# Patient Record
Sex: Male | Born: 1935 | Race: White | Hispanic: No | Marital: Married | Smoking: Former smoker
Health system: Southern US, Community
[De-identification: ages and names within clinical notes are randomized; demographics above are authoritative.]

## PROBLEM LIST (undated history)

## (undated) DIAGNOSIS — C4490 Unspecified malignant neoplasm of skin, unspecified: Secondary | ICD-10-CM

## (undated) DIAGNOSIS — I10 Essential (primary) hypertension: Secondary | ICD-10-CM

## (undated) DIAGNOSIS — R600 Localized edema: Secondary | ICD-10-CM

## (undated) DIAGNOSIS — K5792 Diverticulitis of intestine, part unspecified, without perforation or abscess without bleeding: Secondary | ICD-10-CM

## (undated) DIAGNOSIS — E7800 Pure hypercholesterolemia, unspecified: Secondary | ICD-10-CM

## (undated) HISTORY — PX: TONSILLECTOMY: SUR1361

## (undated) HISTORY — PX: COLONOSCOPY: SHX174

## (undated) HISTORY — PX: HERNIA REPAIR: SHX51

## (undated) HISTORY — PX: COLON RESECTION: SHX5231

---

## 2004-07-07 ENCOUNTER — Ambulatory Visit: Payer: Self-pay | Admitting: Gastroenterology

## 2004-07-21 ENCOUNTER — Other Ambulatory Visit: Payer: Self-pay

## 2004-07-24 ENCOUNTER — Inpatient Hospital Stay: Payer: Self-pay | Admitting: Surgery

## 2006-01-29 ENCOUNTER — Emergency Department: Payer: Self-pay | Admitting: Emergency Medicine

## 2007-09-09 ENCOUNTER — Other Ambulatory Visit: Payer: Self-pay

## 2007-09-09 ENCOUNTER — Emergency Department: Payer: Self-pay | Admitting: Emergency Medicine

## 2008-11-05 ENCOUNTER — Inpatient Hospital Stay: Payer: Self-pay | Admitting: Internal Medicine

## 2008-12-11 ENCOUNTER — Ambulatory Visit: Payer: Self-pay | Admitting: Unknown Physician Specialty

## 2010-04-14 ENCOUNTER — Ambulatory Visit: Payer: Self-pay | Admitting: Unknown Physician Specialty

## 2010-07-16 ENCOUNTER — Ambulatory Visit: Payer: Self-pay | Admitting: Surgery

## 2010-11-09 ENCOUNTER — Inpatient Hospital Stay: Payer: Self-pay | Admitting: Internal Medicine

## 2011-07-20 ENCOUNTER — Emergency Department: Payer: Self-pay | Admitting: Unknown Physician Specialty

## 2014-11-08 DIAGNOSIS — M1A9XX0 Chronic gout, unspecified, without tophus (tophi): Secondary | ICD-10-CM | POA: Insufficient documentation

## 2014-11-08 DIAGNOSIS — M1A00X0 Idiopathic chronic gout, unspecified site, without tophus (tophi): Secondary | ICD-10-CM | POA: Insufficient documentation

## 2015-05-02 DIAGNOSIS — R600 Localized edema: Secondary | ICD-10-CM | POA: Insufficient documentation

## 2015-09-18 ENCOUNTER — Ambulatory Visit: Payer: Medicare Other | Admitting: Anesthesiology

## 2015-09-18 ENCOUNTER — Encounter: Payer: Self-pay | Admitting: *Deleted

## 2015-09-18 ENCOUNTER — Ambulatory Visit
Admission: RE | Admit: 2015-09-18 | Discharge: 2015-09-18 | Disposition: A | Discharge status: Home / Self Care | Payer: Medicare Other | Source: Ambulatory Visit | Attending: Ophthalmology | Admitting: Ophthalmology

## 2015-09-18 ENCOUNTER — Encounter
Admission: RE | Disposition: A | Discharge status: Home / Self Care | Payer: Self-pay | Source: Ambulatory Visit | Attending: Ophthalmology

## 2015-09-18 DIAGNOSIS — H2511 Age-related nuclear cataract, right eye: Secondary | ICD-10-CM | POA: Insufficient documentation

## 2015-09-18 DIAGNOSIS — M7989 Other specified soft tissue disorders: Secondary | ICD-10-CM | POA: Diagnosis not present

## 2015-09-18 DIAGNOSIS — Z85828 Personal history of other malignant neoplasm of skin: Secondary | ICD-10-CM | POA: Insufficient documentation

## 2015-09-18 DIAGNOSIS — K5790 Diverticulosis of intestine, part unspecified, without perforation or abscess without bleeding: Secondary | ICD-10-CM | POA: Diagnosis not present

## 2015-09-18 DIAGNOSIS — I10 Essential (primary) hypertension: Secondary | ICD-10-CM | POA: Insufficient documentation

## 2015-09-18 DIAGNOSIS — Z87891 Personal history of nicotine dependence: Secondary | ICD-10-CM | POA: Insufficient documentation

## 2015-09-18 HISTORY — DX: Diverticulitis of intestine, part unspecified, without perforation or abscess without bleeding: K57.92

## 2015-09-18 HISTORY — DX: Unspecified malignant neoplasm of skin, unspecified: C44.90

## 2015-09-18 HISTORY — DX: Essential (primary) hypertension: I10

## 2015-09-18 HISTORY — DX: Pure hypercholesterolemia, unspecified: E78.00

## 2015-09-18 HISTORY — PX: CATARACT EXTRACTION W/PHACO: SHX586

## 2015-09-18 SURGERY — PHACOEMULSIFICATION, CATARACT, WITH IOL INSERTION
Anesthesia: Monitor Anesthesia Care | Laterality: Right | Wound class: Clean

## 2015-09-18 MED ORDER — POVIDONE-IODINE 5 % OP SOLN
1.0000 "application " | Freq: Once | OPHTHALMIC | Status: AC
  Administered 2015-09-18: 1 via OPHTHALMIC

## 2015-09-18 MED ORDER — NEOMYCIN-POLYMYXIN-DEXAMETH 0.1 % OP OINT
TOPICAL_OINTMENT | OPHTHALMIC | Status: DC | PRN
  Administered 2015-09-18: 1

## 2015-09-18 MED ORDER — MIDAZOLAM HCL 5 MG/5ML IJ SOLN
INTRAMUSCULAR | Status: DC | PRN
  Administered 2015-09-18: 1 mg via INTRAVENOUS

## 2015-09-18 MED ORDER — SODIUM CHLORIDE 0.9 % IV SOLN
INTRAVENOUS | Status: DC
  Administered 2015-09-18: 08:00:00 via INTRAVENOUS

## 2015-09-18 MED ORDER — POVIDONE-IODINE 5 % OP SOLN
OPHTHALMIC | Status: AC
  Administered 2015-09-18: 1 via OPHTHALMIC
  Filled 2015-09-18: qty 30

## 2015-09-18 MED ORDER — CEFUROXIME OPHTHALMIC INJECTION 1 MG/0.1 ML
INJECTION | OPHTHALMIC | Status: DC | PRN
  Administered 2015-09-18: 1 mg via INTRACAMERAL

## 2015-09-18 MED ORDER — NA HYALUR & NA CHOND-NA HYALUR 0.4-0.35 ML IO KIT
PACK | INTRAOCULAR | Status: DC | PRN
  Administered 2015-09-18: 4 mL via INTRAOCULAR

## 2015-09-18 MED ORDER — NA HYALUR & NA CHOND-NA HYALUR 0.55-0.5 ML IO KIT
PACK | INTRAOCULAR | Status: AC
  Filled 2015-09-18: qty 1.05

## 2015-09-18 MED ORDER — NA CHONDROIT SULF-NA HYALURON 40-30 MG/ML IO SOLN
INTRAOCULAR | Status: DC | PRN
  Administered 2015-09-18: 0.5 mL via INTRAOCULAR

## 2015-09-18 MED ORDER — TETRACAINE HCL 0.5 % OP SOLN
OPHTHALMIC | Status: AC
  Administered 2015-09-18: 1 [drp] via OPHTHALMIC
  Filled 2015-09-18: qty 2

## 2015-09-18 MED ORDER — MOXIFLOXACIN HCL 0.5 % OP SOLN
OPHTHALMIC | Status: AC
  Filled 2015-09-18: qty 3

## 2015-09-18 MED ORDER — LIDOCAINE HCL (PF) 4 % IJ SOLN
INTRAMUSCULAR | Status: AC
Start: 2015-09-18 — End: 2015-09-18
  Filled 2015-09-18: qty 5

## 2015-09-18 MED ORDER — CEFUROXIME OPHTHALMIC INJECTION 1 MG/0.1 ML
INJECTION | OPHTHALMIC | Status: AC
  Filled 2015-09-18: qty 0.1

## 2015-09-18 MED ORDER — NEOMYCIN-POLYMYXIN-DEXAMETH 3.5-10000-0.1 OP OINT
TOPICAL_OINTMENT | OPHTHALMIC | Status: AC
  Filled 2015-09-18: qty 3.5

## 2015-09-18 MED ORDER — ARMC OPHTHALMIC DILATING GEL
1.0000 "application " | OPHTHALMIC | Status: DC | PRN
  Administered 2015-09-18 (×2): 1 via OPHTHALMIC

## 2015-09-18 MED ORDER — EPINEPHRINE HCL 1 MG/ML IJ SOLN
INTRAMUSCULAR | Status: AC
  Filled 2015-09-18: qty 1

## 2015-09-18 MED ORDER — TETRACAINE HCL 0.5 % OP SOLN
1.0000 [drp] | Freq: Once | OPHTHALMIC | Status: AC
  Administered 2015-09-18: 1 [drp] via OPHTHALMIC

## 2015-09-18 MED ORDER — ARMC OPHTHALMIC DILATING GEL
OPHTHALMIC | Status: AC
  Administered 2015-09-18: 1 via OPHTHALMIC
  Filled 2015-09-18: qty 0.25

## 2015-09-18 MED ORDER — MOXIFLOXACIN HCL 0.5 % OP SOLN: 1.0000 [drp] | Freq: Once | OPHTHALMIC | Status: DC

## 2015-09-18 MED ORDER — CARBACHOL 0.01 % IO SOLN
INTRAOCULAR | Status: DC | PRN
  Administered 2015-09-18: 0.5 mL via INTRAOCULAR

## 2015-09-18 MED ORDER — LIDOCAINE HCL (PF) 4 % IJ SOLN
INTRAMUSCULAR | Status: DC | PRN
  Administered 2015-09-18: 4 mL via OPHTHALMIC

## 2015-09-18 SURGICAL SUPPLY — 23 items
BSS ×3 IMPLANT
CANNULA ANT/CHMB 27GA (MISCELLANEOUS) ×3 IMPLANT
CUP MEDICINE 2OZ PLAST GRAD ST (MISCELLANEOUS) ×3 IMPLANT
GLOVE BIO SURGEON STRL SZ8 (GLOVE) ×3 IMPLANT
GLOVE BIOGEL M 6.5 STRL (GLOVE) ×3 IMPLANT
GLOVE SURG LX 7.5 STRW (GLOVE) ×2
GLOVE SURG LX STRL 7.5 STRW (GLOVE) ×1 IMPLANT
GOWN STRL REUS W/ TWL LRG LVL3 (GOWN DISPOSABLE) ×2 IMPLANT
GOWN STRL REUS W/TWL LRG LVL3 (GOWN DISPOSABLE) ×4
LENS IOL TECNIS 14.5 (Intraocular Lens) ×3 IMPLANT
LENS IOL TECNIS MONO 1P 14.5 (Intraocular Lens) ×1 IMPLANT
PACK CATARACT (MISCELLANEOUS) ×3 IMPLANT
PACK CATARACT BRASINGTON LX (MISCELLANEOUS) ×3 IMPLANT
PACK EYE AFTER SURG (MISCELLANEOUS) ×3 IMPLANT
SOL BSS BAG (MISCELLANEOUS) ×3
SOL PREP PVP 2OZ (MISCELLANEOUS) ×3
SOLUTION BSS BAG (MISCELLANEOUS) ×1 IMPLANT
SOLUTION PREP PVP 2OZ (MISCELLANEOUS) ×1 IMPLANT
SYR 3ML LL SCALE MARK (SYRINGE) ×3 IMPLANT
SYR 5ML LL (SYRINGE) ×3 IMPLANT
SYR TB 1ML 27GX1/2 LL (SYRINGE) ×3 IMPLANT
WATER STERILE IRR 1000ML POUR (IV SOLUTION) ×3 IMPLANT
WIPE NON LINTING 3.25X3.25 (MISCELLANEOUS) ×3 IMPLANT

## 2015-09-18 NOTE — H&P (Signed)
  The History and Physical notes are on paper, have been signed, and are to be scanned. The patient remains stable and unchanged from the H&P.   Previous H&P reviewed, patient examined, and there are no changes.  Keith Bryant 09/18/2015 8:05 AM

## 2015-09-18 NOTE — Anesthesia Preprocedure Evaluation (Signed)
Anesthesia Evaluation  Patient identified by MRN, date of birth, ID band Patient awake    Reviewed: Allergy & Precautions, H&P , NPO status , Patient's Chart, lab work & pertinent test results, reviewed documented beta blocker date and time   History of Anesthesia Complications Negative for: history of anesthetic complications  Airway Mallampati: III  TM Distance: >3 FB Neck ROM: full    Dental no notable dental hx. (+) Caps, Poor Dentition   Pulmonary neg shortness of breath, neg sleep apnea, neg COPD, neg recent URI, former smoker,    Pulmonary exam normal breath sounds clear to auscultation       Cardiovascular Exercise Tolerance: Good hypertension, (-) angina(-) CAD, (-) Past MI, (-) Cardiac Stents and (-) CABG Normal cardiovascular exam(-) dysrhythmias + Valvular Problems/Murmurs  Rhythm:regular Rate:Normal     Neuro/Psych negative neurological ROS  negative psych ROS   GI/Hepatic Neg liver ROS, GERD  Controlled,  Endo/Other  negative endocrine ROS  Renal/GU negative Renal ROS  negative genitourinary   Musculoskeletal   Abdominal   Peds  Hematology negative hematology ROS (+)   Anesthesia Other Findings Past Medical History:   Hypertension                                                 Diverticulitis                                               Hypercholesteremia                                           Skin cancer                                                  Reproductive/Obstetrics negative OB ROS                             Anesthesia Physical Anesthesia Plan  ASA: II  Anesthesia Plan: MAC   Post-op Pain Management:    Induction:   Airway Management Planned:   Additional Equipment:   Intra-op Plan:   Post-operative Plan:   Informed Consent: I have reviewed the patients History and Physical, chart, labs and discussed the procedure including the risks, benefits  and alternatives for the proposed anesthesia with the patient or authorized representative who has indicated his/her understanding and acceptance.   Dental Advisory Given  Plan Discussed with: Anesthesiologist, CRNA and Surgeon  Anesthesia Plan Comments:         Anesthesia Quick Evaluation

## 2015-09-18 NOTE — Op Note (Signed)
OPERATIVE NOTE  Keith Bryant [id number] 09/18/2015   PREOPERATIVE DIAGNOSIS:  Nuclear Sclerotic Cataract Right Eye H25.11   POSTOPERATIVE DIAGNOSIS: Nuclear Sclerotic Cataract Right Eye H25.11          PROCEDURE:  Phacoemusification with posterior chamber intraocular lens placement of the right eye   LENS:   Implant Name Type Inv. Item Serial No. Manufacturer Lot No. LRB No. Used  LENS IOL TECNIS 14.5 - [id number] Intraocular Lens LENS IOL TECNIS 14.5 [id number] AMO   Right 1       ULTRASOUND TIME: 12.5 %  of 0 minutes 57 seconds, CDE 7.2  SURGEON:  Wyonia Hough, MD   ANESTHESIA:  Topical with tetracaine drops and 2% Xylocaine jelly, augmented with 1% preservative-free intracameral lidocaine.    COMPLICATIONS:  None.   DESCRIPTION OF PROCEDURE:  The patient was identified in the holding room and transported to the operating room and placed in the supine position under the operating microscope. The right eye was identified as the operative eye and it was prepped and draped in the usual sterile ophthalmic fashion.   A 1 millimeter clear-corneal paracentesis was made at the 12:00 position.  0.5 ml of preservative-free 1% lidocaine was injected into the anterior chamber. The anterior chamber was filled with Viscoat viscoelastic.  A 2.4 millimeter keratome was used to make a near-clear corneal incision at the 9:00 position. A curvilinear capsulorrhexis was made with a cystotome and capsulorrhexis forceps.  Balanced salt solution was used to hydrodissect and hydrodelineate the nucleus.   Phacoemulsification was then used in stop and chop fashion to remove the lens nucleus and epinucleus.  The remaining cortex was then removed using the irrigation and aspiration handpiece. Provisc was then placed into the capsular bag to distend it for lens placement.  A lens was then injected into the capsular bag.  The remaining viscoelastic was aspirated.  Wounds were hydrated with  balanced salt solution.  The anterior chamber was inflated to a physiologic pressure with balanced salt solution. Cefuroxime 0.1 ml of a 10mg /ml solution was injected into the anterior chamber for a dose of 1 mg of intracameral antibiotic at the completion of the case. Miostat was placed into the anterior chamber to constrict the pupil.  No wound leaks were noted.  Topical Vigamox drops and Maxitrol ointment were applied to the eye.  The patient was taken to the recovery room in stable condition without complications of anesthesia or surgery.  Keith Bryant 09/18/2015, 9:02 AM

## 2015-09-18 NOTE — Discharge Instructions (Signed)
Eye Surgery Discharge Instructions  Expect mild scratchy sensation or mild soreness. DO NOT RUB YOUR EYE!  The day of surgery:  Minimal physical activity, but bed rest is not required  No reading, computer work, or close hand work  No bending, lifting, or straining.  May watch TV  For 24 hours:  No driving, legal decisions, or alcoholic beverages  Safety precautions  Eat anything you prefer: It is better to start with liquids, then soup then solid foods.  _____ Eye patch should be worn until postoperative exam tomorrow.  ____ Solar shield eyeglasses should be worn for comfort in the sunlight/patch while sleeping  Resume all regular medications including aspirin or Coumadin if these were discontinued prior to surgery. You may shower, bathe, shave, or wash your hair. Tylenol may be taken for mild discomfort.  Call your doctor if you experience significant pain, nausea, or vomiting, fever > 101 or other signs of infection. [phone] or [phone] Specific instructions:  Follow-up Information    Follow up with Leandrew Koyanagi, MD On 09/19/2015.   Specialty:  Ophthalmology   Why:  8:20   Contact information:   [address]   [place] [address] [phone]

## 2015-09-18 NOTE — Transfer of Care (Signed)
Immediate Anesthesia Transfer of Care Note  Patient: Keith Bryant.  Procedure(s) Performed: Procedure(s) with comments: CATARACT EXTRACTION PHACO AND INTRAOCULAR LENS PLACEMENT (IOC) (Right) - US00:57.4 AP12.5 CDE7.15 FLUID LOT [id number]  Patient Location: PACU  Anesthesia Type:MAC  Level of Consciousness: awake, alert , oriented and patient cooperative  Airway & Oxygen Therapy: Patient Spontanous Breathing  Post-op Assessment: Report given to RN, Post -op Vital signs reviewed and stable and Patient moving all extremities X 4  Post vital signs: Reviewed and stable  Last Vitals:  Filed Vitals:   09/18/15 0711  BP: 164/70  Pulse: 71  Temp: 35.9 C  Resp: 18    Complications: No apparent anesthesia complications

## 2015-09-18 NOTE — Anesthesia Postprocedure Evaluation (Signed)
Anesthesia Post Note  Patient: Keith A Merrow Sr.  Procedure(s) Performed: Procedure(s) (LRB): CATARACT EXTRACTION PHACO AND INTRAOCULAR LENS PLACEMENT ([facility]) (Right)  Patient location during evaluation: PACU Anesthesia Type: MAC Level of consciousness: awake and alert, patient cooperative and oriented Pain management: satisfactory to patient Vital Signs Assessment: post-procedure vital signs reviewed and stable Respiratory status: nonlabored ventilation, respiratory function stable and spontaneous breathing Cardiovascular status: stable Postop Assessment: no headache, no backache and no signs of nausea or vomiting Anesthetic complications: no    Last Vitals:  Filed Vitals:   09/18/15 0711  BP: 164/70  Pulse: 71  Temp: 35.9 C  Resp: 18    Last Pain: There were no vitals filed for this visit.               Silvana Newness A

## 2015-09-19 ENCOUNTER — Encounter: Payer: Self-pay | Admitting: Ophthalmology

## 2015-09-22 ENCOUNTER — Encounter: Payer: Self-pay | Admitting: Ophthalmology

## 2015-10-14 ENCOUNTER — Encounter: Payer: Self-pay | Admitting: *Deleted

## 2015-10-16 ENCOUNTER — Encounter: Payer: Self-pay | Admitting: *Deleted

## 2015-10-16 ENCOUNTER — Encounter
Admission: RE | Disposition: A | Discharge status: Home / Self Care | Payer: Self-pay | Source: Ambulatory Visit | Attending: Ophthalmology

## 2015-10-16 ENCOUNTER — Ambulatory Visit
Admission: RE | Admit: 2015-10-16 | Discharge: 2015-10-16 | Disposition: A | Discharge status: Home / Self Care | Payer: Medicare Other | Source: Ambulatory Visit | Attending: Ophthalmology | Admitting: Ophthalmology

## 2015-10-16 ENCOUNTER — Ambulatory Visit: Payer: Medicare Other | Admitting: Anesthesiology

## 2015-10-16 DIAGNOSIS — Z87891 Personal history of nicotine dependence: Secondary | ICD-10-CM | POA: Insufficient documentation

## 2015-10-16 DIAGNOSIS — M7989 Other specified soft tissue disorders: Secondary | ICD-10-CM | POA: Diagnosis not present

## 2015-10-16 DIAGNOSIS — Z9841 Cataract extraction status, right eye: Secondary | ICD-10-CM | POA: Insufficient documentation

## 2015-10-16 DIAGNOSIS — E7800 Pure hypercholesterolemia, unspecified: Secondary | ICD-10-CM | POA: Diagnosis not present

## 2015-10-16 DIAGNOSIS — Z85828 Personal history of other malignant neoplasm of skin: Secondary | ICD-10-CM | POA: Diagnosis not present

## 2015-10-16 DIAGNOSIS — I10 Essential (primary) hypertension: Secondary | ICD-10-CM | POA: Diagnosis not present

## 2015-10-16 DIAGNOSIS — K5790 Diverticulosis of intestine, part unspecified, without perforation or abscess without bleeding: Secondary | ICD-10-CM | POA: Insufficient documentation

## 2015-10-16 DIAGNOSIS — H2512 Age-related nuclear cataract, left eye: Secondary | ICD-10-CM | POA: Insufficient documentation

## 2015-10-16 HISTORY — PX: CATARACT EXTRACTION W/PHACO: SHX586

## 2015-10-16 HISTORY — DX: Localized edema: R60.0

## 2015-10-16 SURGERY — PHACOEMULSIFICATION, CATARACT, WITH IOL INSERTION
Anesthesia: Monitor Anesthesia Care | Laterality: Left | Wound class: Clean

## 2015-10-16 MED ORDER — LIDOCAINE HCL (PF) 4 % IJ SOLN
INTRAMUSCULAR | Status: DC | PRN
  Administered 2015-10-16: .5 mL via OPHTHALMIC

## 2015-10-16 MED ORDER — ARMC OPHTHALMIC DILATING GEL
OPHTHALMIC | Status: AC
  Administered 2015-10-16: 1
  Filled 2015-10-16: qty 0.25

## 2015-10-16 MED ORDER — CEFUROXIME OPHTHALMIC INJECTION 1 MG/0.1 ML
INJECTION | OPHTHALMIC | Status: DC | PRN
  Administered 2015-10-16: .1 mL via INTRACAMERAL

## 2015-10-16 MED ORDER — NEOMYCIN-POLYMYXIN-DEXAMETH 3.5-10000-0.1 OP OINT
TOPICAL_OINTMENT | OPHTHALMIC | Status: AC
  Filled 2015-10-16: qty 3.5

## 2015-10-16 MED ORDER — EPINEPHRINE HCL 1 MG/ML IJ SOLN
INTRAOCULAR | Status: DC | PRN
  Administered 2015-10-16: 250 mL via OPHTHALMIC

## 2015-10-16 MED ORDER — SODIUM CHLORIDE 0.9 % IV SOLN
INTRAVENOUS | Status: DC
  Administered 2015-10-16: 07:00:00 via INTRAVENOUS

## 2015-10-16 MED ORDER — CEFUROXIME OPHTHALMIC INJECTION 1 MG/0.1 ML
INJECTION | OPHTHALMIC | Status: AC
  Filled 2015-10-16: qty 0.1

## 2015-10-16 MED ORDER — MOXIFLOXACIN HCL 0.5 % OP SOLN
OPHTHALMIC | Status: AC
  Filled 2015-10-16: qty 3

## 2015-10-16 MED ORDER — MIDAZOLAM HCL 2 MG/2ML IJ SOLN
INTRAMUSCULAR | Status: DC | PRN
  Administered 2015-10-16: 1 mg via INTRAVENOUS

## 2015-10-16 MED ORDER — TETRACAINE HCL 0.5 % OP SOLN: 1.0000 [drp] | Freq: Once | OPHTHALMIC | Status: DC

## 2015-10-16 MED ORDER — ARMC OPHTHALMIC DILATING GEL: 1.0000 "application " | OPHTHALMIC | Status: AC

## 2015-10-16 MED ORDER — TETRACAINE HCL 0.5 % OP SOLN
OPHTHALMIC | Status: AC
  Administered 2015-10-16: 1 [drp]
  Filled 2015-10-16: qty 2

## 2015-10-16 MED ORDER — NA HYALUR & NA CHOND-NA HYALUR 0.4-0.35 ML IO KIT
PACK | INTRAOCULAR | Status: DC | PRN
  Administered 2015-10-16: .75 mL via INTRAOCULAR

## 2015-10-16 MED ORDER — EPINEPHRINE HCL 1 MG/ML IJ SOLN
INTRAMUSCULAR | Status: AC
  Filled 2015-10-16: qty 1

## 2015-10-16 MED ORDER — NA HYALUR & NA CHOND-NA HYALUR 0.55-0.5 ML IO KIT
PACK | INTRAOCULAR | Status: AC
  Filled 2015-10-16: qty 1.05

## 2015-10-16 MED ORDER — MOXIFLOXACIN HCL 0.5 % OP SOLN: 1.0000 [drp] | OPHTHALMIC | Status: DC | PRN

## 2015-10-16 MED ORDER — POVIDONE-IODINE 5 % OP SOLN
OPHTHALMIC | Status: AC
  Administered 2015-10-16: 1
  Filled 2015-10-16: qty 30

## 2015-10-16 MED ORDER — POVIDONE-IODINE 5 % OP SOLN: 1.0000 "application " | Freq: Once | OPHTHALMIC | Status: DC

## 2015-10-16 MED ORDER — NEOMYCIN-POLYMYXIN-DEXAMETH 0.1 % OP OINT
TOPICAL_OINTMENT | OPHTHALMIC | Status: DC | PRN
  Administered 2015-10-16: 1 via OPHTHALMIC

## 2015-10-16 MED ORDER — LIDOCAINE HCL (PF) 4 % IJ SOLN
INTRAMUSCULAR | Status: AC
  Filled 2015-10-16: qty 5

## 2015-10-16 MED ORDER — CARBACHOL 0.01 % IO SOLN
INTRAOCULAR | Status: DC | PRN
  Administered 2015-10-16: .5 mL via INTRAOCULAR

## 2015-10-16 SURGICAL SUPPLY — 23 items
BSS 15ML ×3 IMPLANT
CANNULA ANT/CHMB 27GA (MISCELLANEOUS) ×3 IMPLANT
CUP MEDICINE 2OZ PLAST GRAD ST (MISCELLANEOUS) ×3 IMPLANT
GLOVE BIO SURGEON STRL SZ8 (GLOVE) ×3 IMPLANT
GLOVE BIOGEL M 6.5 STRL (GLOVE) ×3 IMPLANT
GLOVE SURG LX 7.5 STRW (GLOVE) ×2
GLOVE SURG LX STRL 7.5 STRW (GLOVE) ×1 IMPLANT
GOWN STRL REUS W/ TWL LRG LVL3 (GOWN DISPOSABLE) ×2 IMPLANT
GOWN STRL REUS W/TWL LRG LVL3 (GOWN DISPOSABLE) ×4
LENS IOL TECNIS 15.5 (Intraocular Lens) ×3 IMPLANT
LENS IOL TECNIS MONO 1P 15.5 (Intraocular Lens) ×1 IMPLANT
PACK CATARACT (MISCELLANEOUS) ×3 IMPLANT
PACK CATARACT BRASINGTON LX (MISCELLANEOUS) ×3 IMPLANT
PACK EYE AFTER SURG (MISCELLANEOUS) ×3 IMPLANT
SOL BSS BAG (MISCELLANEOUS) ×3
SOL PREP PVP 2OZ (MISCELLANEOUS) ×3
SOLUTION BSS BAG (MISCELLANEOUS) ×1 IMPLANT
SOLUTION PREP PVP 2OZ (MISCELLANEOUS) ×1 IMPLANT
SYR 3ML LL SCALE MARK (SYRINGE) ×3 IMPLANT
SYR 5ML LL (SYRINGE) ×3 IMPLANT
SYR TB 1ML 27GX1/2 LL (SYRINGE) ×3 IMPLANT
WATER STERILE IRR 1000ML POUR (IV SOLUTION) ×3 IMPLANT
WIPE NON LINTING 3.25X3.25 (MISCELLANEOUS) ×3 IMPLANT

## 2015-10-16 NOTE — Op Note (Signed)
OPERATIVE NOTE  Jomes A Lindly Sr. [id number] 10/16/2015   PREOPERATIVE DIAGNOSIS:  Nuclear sclerotic cataract left eye. H25.12   POSTOPERATIVE DIAGNOSIS:    Nuclear sclerotic cataract left eye.     PROCEDURE:  Phacoemusification with posterior chamber intraocular lens placement of the left eye   LENS:   Implant Name Type Inv. Item Serial No. Manufacturer Lot No. LRB No. Used  LENS IMPL INTRAOC ZCB00 15.5 - [id number] Intraocular Lens LENS IMPL INTRAOC ZCB00 15.5 [id number]   Left 1        ULTRASOUND TIME: 12.5 % of 1 minutes, 3 seconds.  CDE 8.8   SURGEON:  Wyonia Hough, MD   ANESTHESIA:  Topical with tetracaine drops and 2% Xylocaine jelly, augmented with 1% preservative-free intracameral lidocaine.    COMPLICATIONS:  None.   DESCRIPTION OF PROCEDURE:  The patient was identified in the holding room and transported to the operating room and placed in the supine position under the operating microscope.  The left eye was identified as the operative eye and it was prepped and draped in the usual sterile ophthalmic fashion.   A 1 millimeter clear-corneal paracentesis was made at the 1:30 position.  0.5 ml of preservative-free 1% lidocaine was injected into the anterior chamber. The anterior chamber was filled with Viscoat viscoelastic.  A 2.4 millimeter keratome was used to make a near-clear corneal incision at the 10:30 position.  .  A curvilinear capsulorrhexis was made with a cystotome and capsulorrhexis forceps.  Balanced salt solution was used to hydrodissect and hydrodelineate the nucleus.   Phacoemulsification was then used in stop and chop fashion to remove the lens nucleus and epinucleus.  The remaining cortex was then removed using the irrigation and aspiration handpiece. Provisc was then placed into the capsular bag to distend it for lens placement.  A lens was then injected into the capsular bag.  The remaining viscoelastic was aspirated.   Wounds were hydrated  with balanced salt solution.  The anterior chamber was inflated to a physiologic pressure with balanced salt solution. Cefuroxime 0.1 ml of a 10mg /ml solution was injected into the anterior chamber for a dose of 1 mg of intracameral antibiotic at the completion of the case.  Miostat was placed into the anterior chamber to constrict the pupil.  No wound leaks were noted.  Topical Vigamox drops and Maxitrol ointment were applied to the eye.  The patient was taken to the recovery room in stable condition without complications of anesthesia or surgery  Shann Lewellyn 10/16/2015, 7:55 AM

## 2015-10-16 NOTE — H&P (Signed)
  The History and Physical notes are on paper, have been signed, and are to be scanned. The patient remains stable and unchanged from the H&P.   Previous H&P reviewed, patient examined, and there are no changes.  Keith Bryant 10/16/2015 7:25 AM

## 2015-10-16 NOTE — Anesthesia Postprocedure Evaluation (Signed)
Anesthesia Post Note  Patient: Keith A Degenhart Sr.  Procedure(s) Performed: Procedure(s) (LRB): CATARACT EXTRACTION PHACO AND INTRAOCULAR LENS PLACEMENT (IOC) (Left)  Patient location during evaluation: Other Anesthesia Type: General Level of consciousness: awake and alert Pain management: pain level controlled Vital Signs Assessment: post-procedure vital signs reviewed and stable Respiratory status: spontaneous breathing, nonlabored ventilation, respiratory function stable and patient connected to nasal cannula oxygen Cardiovascular status: blood pressure returned to baseline and stable Postop Assessment: no signs of nausea or vomiting Anesthetic complications: no    Last Vitals:  Filed Vitals:   10/16/15 0758 10/16/15 0826  BP: 160/81 155/67  Pulse: 68 72  Temp: 36.8 C 36.8 C  Resp: 16 18    Last Pain: There were no vitals filed for this visit.               [name]

## 2015-10-16 NOTE — Anesthesia Procedure Notes (Signed)
Procedure Name: MAC Date/Time: 10/16/2015 7:36 AM Performed by: Johnna Acosta Pre-anesthesia Checklist: Patient identified, Emergency Drugs available, Suction available, Patient being monitored and Timeout performed Patient Re-evaluated:Patient Re-evaluated prior to inductionOxygen Delivery Method: Nasal cannula

## 2015-10-16 NOTE — Discharge Instructions (Signed)
Eye Surgery Discharge Instructions  Expect mild scratchy sensation or mild soreness. DO NOT RUB YOUR EYE!  The day of surgery:  Minimal physical activity, but bed rest is not required  No reading, computer work, or close hand work  No bending, lifting, or straining.  May watch TV  For 24 hours:  No driving, legal decisions, or alcoholic beverages  Safety precautions  Eat anything you prefer: It is better to start with liquids, then soup then solid foods.  _____ Eye patch should be worn until postoperative exam tomorrow.  ____ Solar shield eyeglasses should be worn for comfort in the sunlight/patch while sleeping  Resume all regular medications including aspirin or Coumadin if these were discontinued prior to surgery. You may shower, bathe, shave, or wash your hair. Tylenol may be taken for mild discomfort.  Call your doctor if you experience significant pain, nausea, or vomiting, fever > 101 or other signs of infection. [phone] or [phone] Specific instructions:  Follow-up Information    Follow up with Leandrew Koyanagi, MD. Go on 10/17/2015.   Specialty:  Ophthalmology   Why:  Appointment time is set for 10:10 TOMORROW, For wound re-check   Contact information:   [place] [address] [phone]     AMBULATORY SURGERY  DISCHARGE INSTRUCTIONS   1) The drugs that you were given will stay in your system until tomorrow so for the next 24 hours you should not:  A) Drive an automobile B) Make any legal decisions C) Drink any alcoholic beverage   2) You may resume regular meals tomorrow.  Today it is better to start with liquids and gradually work up to solid foods.  You may eat anything you prefer, but it is better to start with liquids, then soup and crackers, and gradually work up to solid foods.   3) Please notify your doctor immediately if you have any unusual bleeding, trouble breathing, redness and pain at the surgery site,  drainage, fever, or pain not relieved by medication.    4) Additional Instructions:        Please contact your physician with any problems or Same Day Surgery at [phone], Monday through Friday 6 am to 4 pm, or [place] [facility] number at [phone].

## 2015-10-16 NOTE — Anesthesia Preprocedure Evaluation (Signed)
Anesthesia Evaluation  Patient identified by MRN, date of birth, ID band Patient awake    Reviewed: Allergy & Precautions, NPO status , Patient's Chart, lab work & pertinent test results, reviewed documented beta blocker date and time   Airway Mallampati: II  TM Distance: >3 FB     Dental  (+) Chipped   Pulmonary former smoker,           Cardiovascular hypertension, Pt. on medications      Neuro/Psych    GI/Hepatic   Endo/Other    Renal/GU      Musculoskeletal   Abdominal   Peds  Hematology   Anesthesia Other Findings   Reproductive/Obstetrics                             Anesthesia Physical Anesthesia Plan  ASA: III  Anesthesia Plan: MAC   Post-op Pain Management:    Induction:   Airway Management Planned:   Additional Equipment:   Intra-op Plan:   Post-operative Plan:   Informed Consent: I have reviewed the patients History and Physical, chart, labs and discussed the procedure including the risks, benefits and alternatives for the proposed anesthesia with the patient or authorized representative who has indicated his/her understanding and acceptance.     Plan Discussed with: CRNA  Anesthesia Plan Comments:         Anesthesia Quick Evaluation

## 2015-10-16 NOTE — Transfer of Care (Signed)
Immediate Anesthesia Transfer of Care Note  Patient: Keith Bryant.  Procedure(s) Performed: Procedure(s) with comments: CATARACT EXTRACTION PHACO AND INTRAOCULAR LENS PLACEMENT ([place]) (Left) - [place]   1:02.9 AP%  12.5 CDE    8.81 casette lot # [id number]   exp 02/10/2017  Patient Location: PACU  Anesthesia Type:MAC  Level of Consciousness: awake, alert  and oriented  Airway & Oxygen Therapy: Patient Spontanous Breathing  Post-op Assessment: Report given to RN  Post vital signs: Reviewed and stable  Last Vitals:  Filed Vitals:   10/14/15 1703 10/16/15 0616  BP: 147/72 155/74  Pulse: 78 78  Temp:  36.5 C  Resp:  16    Complications: No apparent anesthesia complications

## 2015-10-17 ENCOUNTER — Encounter: Payer: Self-pay | Admitting: Ophthalmology

## 2016-03-06 ENCOUNTER — Other Ambulatory Visit: Payer: Self-pay

## 2016-03-06 ENCOUNTER — Inpatient Hospital Stay
Admission: EM | Admit: 2016-03-06 | Discharge: 2016-03-11 | DRG: 389 | Disposition: A | Discharge status: Home / Self Care | Payer: Medicare Other | Attending: Surgery | Admitting: Surgery

## 2016-03-06 ENCOUNTER — Emergency Department: Payer: Medicare Other

## 2016-03-06 ENCOUNTER — Encounter: Payer: Self-pay | Admitting: Surgery

## 2016-03-06 ENCOUNTER — Inpatient Hospital Stay: Payer: Medicare Other

## 2016-03-06 DIAGNOSIS — Z800 Family history of malignant neoplasm of digestive organs: Secondary | ICD-10-CM | POA: Diagnosis not present

## 2016-03-06 DIAGNOSIS — E119 Type 2 diabetes mellitus without complications: Secondary | ICD-10-CM | POA: Diagnosis present

## 2016-03-06 DIAGNOSIS — N179 Acute kidney failure, unspecified: Secondary | ICD-10-CM | POA: Diagnosis not present

## 2016-03-06 DIAGNOSIS — E7800 Pure hypercholesterolemia, unspecified: Secondary | ICD-10-CM | POA: Diagnosis present

## 2016-03-06 DIAGNOSIS — Z87891 Personal history of nicotine dependence: Secondary | ICD-10-CM | POA: Diagnosis not present

## 2016-03-06 DIAGNOSIS — N400 Enlarged prostate without lower urinary tract symptoms: Secondary | ICD-10-CM | POA: Diagnosis present

## 2016-03-06 DIAGNOSIS — Z79899 Other long term (current) drug therapy: Secondary | ICD-10-CM

## 2016-03-06 DIAGNOSIS — Z85828 Personal history of other malignant neoplasm of skin: Secondary | ICD-10-CM

## 2016-03-06 DIAGNOSIS — Z803 Family history of malignant neoplasm of breast: Secondary | ICD-10-CM

## 2016-03-06 DIAGNOSIS — I10 Essential (primary) hypertension: Secondary | ICD-10-CM | POA: Diagnosis not present

## 2016-03-06 DIAGNOSIS — E785 Hyperlipidemia, unspecified: Secondary | ICD-10-CM | POA: Diagnosis not present

## 2016-03-06 DIAGNOSIS — E876 Hypokalemia: Secondary | ICD-10-CM | POA: Diagnosis not present

## 2016-03-06 DIAGNOSIS — E860 Dehydration: Secondary | ICD-10-CM | POA: Diagnosis present

## 2016-03-06 DIAGNOSIS — K565 Intestinal adhesions [bands] with obstruction (postprocedural) (postinfection): Secondary | ICD-10-CM | POA: Diagnosis present

## 2016-03-06 DIAGNOSIS — K5669 Other intestinal obstruction: Secondary | ICD-10-CM | POA: Diagnosis present

## 2016-03-06 DIAGNOSIS — R1110 Vomiting, unspecified: Secondary | ICD-10-CM | POA: Insufficient documentation

## 2016-03-06 DIAGNOSIS — R739 Hyperglycemia, unspecified: Secondary | ICD-10-CM | POA: Diagnosis not present

## 2016-03-06 DIAGNOSIS — K56609 Unspecified intestinal obstruction, unspecified as to partial versus complete obstruction: Secondary | ICD-10-CM

## 2016-03-06 DIAGNOSIS — Z7982 Long term (current) use of aspirin: Secondary | ICD-10-CM

## 2016-03-06 DIAGNOSIS — D696 Thrombocytopenia, unspecified: Secondary | ICD-10-CM | POA: Diagnosis present

## 2016-03-06 DIAGNOSIS — Z8249 Family history of ischemic heart disease and other diseases of the circulatory system: Secondary | ICD-10-CM

## 2016-03-06 LAB — COMPREHENSIVE METABOLIC PANEL
ALT: 18 U/L (ref 17–63)
AST: 27 U/L (ref 15–41)
Albumin: 4.4 g/dL (ref 3.5–5.0)
Alkaline Phosphatase: 35 U/L — ABNORMAL LOW (ref 38–126)
Anion gap: 16 — ABNORMAL HIGH (ref 5–15)
BUN: 35 mg/dL — ABNORMAL HIGH (ref 6–20)
CO2: 26 mmol/L (ref 22–32)
Calcium: 9.7 mg/dL (ref 8.9–10.3)
Chloride: 97 mmol/L — ABNORMAL LOW (ref 101–111)
Creatinine, Ser: 1.7 mg/dL — ABNORMAL HIGH (ref 0.61–1.24)
GFR calc Af Amer: 41 mL/min — ABNORMAL LOW (ref >60)
GFR calc non Af Amer: 35 mL/min — ABNORMAL LOW (ref >60)
Glucose, Bld: 205 mg/dL — ABNORMAL HIGH (ref 65–99)
Potassium: 3.3 mmol/L — ABNORMAL LOW (ref 3.5–5.1)
Sodium: 139 mmol/L (ref 135–145)
Total Bilirubin: 1.8 mg/dL — ABNORMAL HIGH (ref 0.3–1.2)
Total Protein: 8.1 g/dL (ref 6.5–8.1)

## 2016-03-06 LAB — CBC
HEMATOCRIT: 51.3 % (ref 40.0–52.0)
Hemoglobin: 17.7 g/dL (ref 13.0–18.0)
MCH: 31.1 pg (ref 26.0–34.0)
MCHC: 34.4 g/dL (ref 32.0–36.0)
MCV: 90.2 fL (ref 80.0–100.0)
Platelets: 117 10*3/uL — ABNORMAL LOW (ref 150–440)
RBC: 5.69 MIL/uL (ref 4.40–5.90)
RDW: 14.5 % (ref 11.5–14.5)
WBC: 6.4 10*3/uL (ref 3.8–10.6)

## 2016-03-06 LAB — URINALYSIS COMPLETE WITH MICROSCOPIC (ARMC ONLY)
Bacteria, UA: NONE SEEN
Bilirubin Urine: NEGATIVE
Glucose, UA: NEGATIVE mg/dL
Ketones, ur: NEGATIVE mg/dL
Leukocytes, UA: NEGATIVE
Nitrite: NEGATIVE
Protein, ur: 30 mg/dL — AB
Specific Gravity, Urine: 1.057 — ABNORMAL HIGH (ref 1.005–1.030)
pH: 5 (ref 5.0–8.0)

## 2016-03-06 LAB — GLUCOSE, CAPILLARY: Glucose-Capillary: 103 mg/dL — ABNORMAL HIGH (ref 65–99)

## 2016-03-06 LAB — LIPASE, BLOOD: Lipase: 19 U/L (ref 11–51)

## 2016-03-06 MED ORDER — ONDANSETRON HCL 4 MG/2ML IJ SOLN
4.0000 mg | Freq: Once | INTRAMUSCULAR | Status: AC
  Administered 2016-03-06: 4 mg via INTRAVENOUS
  Filled 2016-03-06: qty 2

## 2016-03-06 MED ORDER — BENZOCAINE (TOPICAL) 20 % EX AERO: INHALATION_SPRAY | Freq: Four times a day (QID) | CUTANEOUS | Status: DC | PRN

## 2016-03-06 MED ORDER — FAMOTIDINE IN NACL 20-0.9 MG/50ML-% IV SOLN
20.0000 mg | Freq: Two times a day (BID) | INTRAVENOUS | Status: DC
  Administered 2016-03-06 – 2016-03-10 (×10): 20 mg via INTRAVENOUS
  Filled 2016-03-06 (×12): qty 50

## 2016-03-06 MED ORDER — HYDROCHLOROTHIAZIDE 25 MG PO TABS: 12.5000 mg | ORAL_TABLET | Freq: Every day | ORAL | Status: DC

## 2016-03-06 MED ORDER — ONDANSETRON HCL 4 MG/2ML IJ SOLN
4.0000 mg | Freq: Once | INTRAMUSCULAR | Status: AC
Start: 2016-03-06 — End: 2016-03-06
  Administered 2016-03-06: 4 mg via INTRAVENOUS

## 2016-03-06 MED ORDER — BENZOCAINE 20 % MT SOLN
OROMUCOSAL | Status: AC
  Filled 2016-03-06: qty 5

## 2016-03-06 MED ORDER — ONDANSETRON 4 MG PO TBDP
4.0000 mg | ORAL_TABLET | Freq: Once | ORAL | Status: DC | PRN
Start: 2016-03-06 — End: 2016-03-06

## 2016-03-06 MED ORDER — ACETAMINOPHEN 500 MG PO TABS: 1000.0000 mg | ORAL_TABLET | Freq: Four times a day (QID) | ORAL | Status: DC | PRN

## 2016-03-06 MED ORDER — ONDANSETRON HCL 4 MG/2ML IJ SOLN
INTRAMUSCULAR | Status: AC
  Filled 2016-03-06: qty 2

## 2016-03-06 MED ORDER — ONDANSETRON 8 MG PO TBDP: 4.0000 mg | ORAL_TABLET | Freq: Four times a day (QID) | ORAL | Status: DC | PRN

## 2016-03-06 MED ORDER — HYDRALAZINE HCL 20 MG/ML IJ SOLN: 10.0000 mg | Freq: Four times a day (QID) | INTRAMUSCULAR | Status: DC | PRN

## 2016-03-06 MED ORDER — POTASSIUM CHLORIDE IN NACL 20-0.9 MEQ/L-% IV SOLN: INTRAVENOUS | Status: DC

## 2016-03-06 MED ORDER — KETOROLAC TROMETHAMINE 15 MG/ML IJ SOLN: 15.0000 mg | Freq: Four times a day (QID) | INTRAMUSCULAR | Status: AC | PRN

## 2016-03-06 MED ORDER — HYDRALAZINE HCL 20 MG/ML IJ SOLN: 10.0000 mg | INTRAMUSCULAR | Status: DC | PRN

## 2016-03-06 MED ORDER — IOPAMIDOL (ISOVUE-300) INJECTION 61%
80.0000 mL | Freq: Once | INTRAVENOUS | Status: AC | PRN
  Administered 2016-03-06: 80 mL via INTRAVENOUS

## 2016-03-06 MED ORDER — ACETAMINOPHEN 500 MG PO TABS
1000.0000 mg | ORAL_TABLET | Freq: Four times a day (QID) | ORAL | Status: DC
  Filled 2016-03-06: qty 2

## 2016-03-06 MED ORDER — SODIUM CHLORIDE 0.9 % IV BOLUS (SEPSIS)
1000.0000 mL | INTRAVENOUS | Status: AC
  Administered 2016-03-06: 1000 mL via INTRAVENOUS

## 2016-03-06 MED ORDER — ONDANSETRON HCL 4 MG/2ML IJ SOLN
4.0000 mg | Freq: Four times a day (QID) | INTRAMUSCULAR | Status: DC | PRN
  Administered 2016-03-10: 4 mg via INTRAVENOUS
  Filled 2016-03-06: qty 2

## 2016-03-06 MED ORDER — ENOXAPARIN SODIUM 40 MG/0.4ML ~~LOC~~ SOLN
40.0000 mg | SUBCUTANEOUS | Status: DC
  Filled 2016-03-06: qty 0.4

## 2016-03-06 MED ORDER — DIATRIZOATE MEGLUMINE & SODIUM 66-10 % PO SOLN
15.0000 mL | Freq: Once | ORAL | Status: AC
  Administered 2016-03-06: 15 mL via ORAL

## 2016-03-06 MED ORDER — LACTATED RINGERS IV SOLN
INTRAVENOUS | Status: DC
  Administered 2016-03-06 – 2016-03-09 (×9): via INTRAVENOUS

## 2016-03-06 MED ORDER — VERAPAMIL HCL ER 240 MG PO TBCR
240.0000 mg | EXTENDED_RELEASE_TABLET | Freq: Two times a day (BID) | ORAL | Status: DC
  Administered 2016-03-06 – 2016-03-11 (×10): 240 mg via ORAL
  Filled 2016-03-06 (×10): qty 1

## 2016-03-06 NOTE — ED Provider Notes (Signed)
Patient did have some some vomiting subsequently while trying to drink oral contrast. CT scan findings as dictated below.  IMPRESSION: 1. CT findings suggest mechanical mid to distal small bowel obstruction due to adhesions. No bowel wall thickening, pneumatosis or pneumoperitoneum. 2. 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 3. Marked sigmoid diverticulosis with no evidence of acute diverticulitis. 4. Nonobstructing left renal stones. 5. Aortic atherosclerosis. Coronary atherosclerosis. 6. Right adrenal myelolipoma.  We will place an NG tube, I will consult surgicalist for admission.    Earleen Newport, MD 03/06/16 [phone]

## 2016-03-06 NOTE — ED Provider Notes (Signed)
[facility] Emergency Department Provider Note  ____________________________________________  Time seen: Approximately 5:53 AM  I have reviewed the triage vital signs and the nursing notes.   HISTORY  Chief Complaint Emesis    HPI Keith A Nicklaus Sr. is a 82 y.o. male who presents for evaluation of intractable vomiting over the last 24+ hours.  He returned from a trip to the [place] where it is primarily just resting and relaxing and shortly after getting back home he began to vomit.  He has vomited numerous times over the last 24 hours.  He is having some mild generalized abdominal aching and also reports some abdominal distention as well.  He says that he has not had a bowel movement in about 2-3 days which is unusual because he normally has one every day.  He also reports decreased flatus compared to usual.  Unable to tolerate anything by mouth yesterday.  His pain is mild but his vomiting is severe.  Nothing is making it better.  His lips are dry and he reports decreased urination.  He has had bowel surgery in the past for diverticulitis.  He denies fever/chills, chest pain, shortness of breath, dysuria.  He is a former smoker and former drinker although he no longer drinks and has not for about 3 years.     Past Medical History  Diagnosis Date  . Hypertension   . Diverticulitis   . Hypercholesteremia   . Skin cancer   . Lower extremity edema     There are no active problems to display for this patient.   Past Surgical History  Procedure Laterality Date  . Colon resection    . Colonoscopy    . Hernia repair    . Cataract extraction w/phaco Right 09/18/2015    Procedure: CATARACT EXTRACTION PHACO AND INTRAOCULAR LENS PLACEMENT (IOC);  Surgeon: Leandrew Koyanagi, MD;  Location: [facility] ORS;  Service: Ophthalmology;  Laterality: Right;  US00:57.4 AP12.5 CDE7.15 FLUID LOT [id number]  . Tonsillectomy    . Cataract extraction w/phaco Left 10/16/2015   Procedure: CATARACT EXTRACTION PHACO AND INTRAOCULAR LENS PLACEMENT (IOC);  Surgeon: Leandrew Koyanagi, MD;  Location: [facility] ORS;  Service: Ophthalmology;  Laterality: Left;  [place]   1:02.9 AP%  12.5 CDE    8.81 casette lot # [id number]   exp 02/10/2017    Current Outpatient Rx  Name  Route  Sig  Dispense  Refill  . aspirin 81 MG tablet   Oral   Take 81 mg by mouth daily as needed for pain.         Marland Kitchen atorvastatin (LIPITOR) 20 MG tablet   Oral   Take 20 mg by mouth daily.         . Cyanocobalamin (VITAMIN B12 PO)   Oral   Take 3,000 mcg by mouth daily.         . hydrochlorothiazide (HYDRODIURIL) 25 MG tablet   Oral   Take 25 mg by mouth daily.         . Magnesium 400 MG TABS   Oral   Take 1 tablet by mouth daily.         . Multiple Vitamins-Minerals (CENTRUM SILVER PO)   Oral   Take 1 tablet by mouth daily.         Marland Kitchen oxybutynin (DITROPAN-XL) 5 MG 24 hr tablet   Oral   Take 5 mg by mouth daily.          Marland Kitchen POTASSIUM PO   Oral  Take 100 mg by mouth daily.          . tamsulosin (FLOMAX) 0.4 MG CAPS capsule   Oral   Take 0.4 mg by mouth daily.         Marland Kitchen VERAPAMIL HCL PO   Oral   Take 240 mg by mouth 2 (two) times daily.           Allergies Review of patient's allergies indicates no known allergies.  No family history on file.  Social History Social History  Substance Use Topics  . Smoking status: Former Research scientist (life sciences)  . Smokeless tobacco: Not on file  . Alcohol Use: Yes     Comment: couple beers a week    Review of Systems Constitutional: No fever/chills Eyes: No visual changes. ENT: No sore throat. Cardiovascular: Denies chest pain. Respiratory: Denies shortness of breath. Gastrointestinal: Mild abdominal pain.  Persistent vomiting, no BMs in several days, decreased flatus Genitourinary: Negative for dysuria. Musculoskeletal: Negative for back pain. Skin: Negative for rash. Neurological: Negative for headaches, focal weakness or  numbness.  10-point ROS otherwise negative.  ____________________________________________   PHYSICAL EXAM:  VITAL SIGNS: ED Triage Vitals  Enc Vitals Group     BP 03/06/16 0445 119/58 mmHg     Pulse Rate 03/06/16 0445 86     Resp 03/06/16 0445 20     Temp 03/06/16 0445 98.6 F (37 C)     Temp Source 03/06/16 0445 Oral     SpO2 03/06/16 0445 96 %     Weight 03/06/16 0443 245 lb (111.131 kg)     Height 03/06/16 0443 6' (1.829 m)     Head Cir --      Peak Flow --      Pain Score --      Pain Loc --      Pain Edu? --      Excl. in [place]? --     Constitutional: Alert and oriented. Well appearing and in no acute distress. Eyes: Conjunctivae are normal. PERRL. EOMI. Head: Atraumatic. Nose: No congestion/rhinnorhea. Mouth/Throat: Mucous membranes are dry.  Oropharynx non-erythematous. Neck: No stridor.  No meningeal signs.   Cardiovascular: Normal rate, regular rhythm. Good peripheral circulation. Grossly normal heart sounds.   Respiratory: Normal respiratory effort.  No retractions. Lungs CTAB. Gastrointestinal: Soft and nontender. Mild distention. Musculoskeletal: No lower extremity tenderness nor edema. No gross deformities of extremities. Neurologic:  Normal speech and language. No gross focal neurologic deficits are appreciated.  Skin:  Skin is warm, dry and intact. No rash noted. Psychiatric: Mood and affect are normal. Speech and behavior are normal.  ____________________________________________   LABS (all labs ordered are listed, but only abnormal results are displayed)  Labs Reviewed  COMPREHENSIVE METABOLIC PANEL - Abnormal; Notable for the following:    Potassium 3.3 (*)    Chloride 97 (*)    Glucose, Bld 205 (*)    BUN 35 (*)    Creatinine, Ser 1.70 (*)    Alkaline Phosphatase 35 (*)    Total Bilirubin 1.8 (*)    GFR calc non Af Amer 35 (*)    GFR calc Af Amer 41 (*)    Anion gap 16 (*)    All other components within normal limits  CBC - Abnormal;  Notable for the following:    Platelets 117 (*)    All other components within normal limits  LIPASE, BLOOD  URINALYSIS COMPLETEWITH MICROSCOPIC ([facility] ONLY)   ____________________________________________  EKG  ED ECG REPORT I,  Enrigue Hashimi, the attending physician, personally viewed and interpreted this ECG.  Date: 03/06/2016 EKG Time: 06:11 Rate: 74 Rhythm: normal sinus rhythm with first-degree AV block QRS Axis: normal Intervals: PR interval is 267 ms ST/T Wave abnormalities: Non-specific ST segment / T-wave changes, but no evidence of acute ischemia. Conduction Disturbances: none Narrative Interpretation: unremarkable  ____________________________________________  RADIOLOGY   Dg Abd Acute W/chest  03/06/2016  CLINICAL DATA:  82 year old male with persistent vomiting and abdominal pain EXAM: DG ABDOMEN ACUTE W/ 1V CHEST COMPARISON:  CT of the abdomen pelvis dated 09/09/2007 FINDINGS: Left lung base atelectatic changes noted. The lungs are otherwise clear. There is no pleural effusion pneumothorax. Top-normal cardiac silhouette. The aorta is tortuous. Multiple dilated air-filled loops of small bowel noted within the pelvis measuring up to 5.3 cm in diameter. There is air-fluid level within the small bowel. Air is noted within the colon. Right lower quadrant surgical clips noted. A 6 mm radiopaque density over the right renal silhouette may represent a renal calculus versus a colonic diverticulith. There is no free air. There is degenerative changes of the spine. No acute fracture. IMPRESSION: Dilated air-filled loops of small bowel may represent small bowel obstruction versus ileus. Clinical correlation and follow-up recommended. Electronically Signed   By: Anner Crete M.D.   On: 03/06/2016 06:57    ____________________________________________   PROCEDURES  Procedure(s) performed:   Procedures   ____________________________________________   INITIAL IMPRESSION /  ASSESSMENT AND PLAN / ED COURSE  Pertinent labs & imaging results that were available during my care of the patient were reviewed by me and considered in my medical decision making (see chart for details).  The patient appears clinically dehydrated and I am providing one liter normal saline.  His creatinine is 1.7 with a GFR of 35 with no history of prior kidney disease.  However a GFR of 35 allows a decreased dose of IV contrast and still able to proceed with by mouth and IV contrast scan.  I originally obtained plain films but they suggest an obstruction and we need to evaluate further, particularly given that he has had prior bowel surgery.  I have updated the family.  His nausea is currently well controlled.  I am transferring emergency department care to Dr. Jimmye Norman at 7:09 AM.   ____________________________________________  FINAL CLINICAL IMPRESSION(S) / ED DIAGNOSES  Final diagnoses:  Intractable vomiting with nausea, vomiting of unspecified type  AKI (acute kidney injury) ([place])     MEDICATIONS GIVEN DURING THIS VISIT:  Medications  ondansetron (ZOFRAN-ODT) disintegrating tablet 4 mg (not administered)  sodium chloride 0.9 % bolus 1,000 mL (1,000 mLs Intravenous New Bag/Given 03/06/16 0617)  ondansetron (ZOFRAN) injection 4 mg (4 mg Intravenous Given 03/06/16 0640)     NEW OUTPATIENT MEDICATIONS STARTED DURING THIS VISIT:  New Prescriptions   No medications on file      Note:  This document was prepared using Dragon voice recognition software and may include unintentional dictation errors.   Hinda Kehr, MD 03/06/16 [phone]

## 2016-03-06 NOTE — ED Notes (Signed)
Patient reports vomiting since Thursday morning.

## 2016-03-06 NOTE — ED Notes (Signed)
Patient with complaint of nausea and vomiting that started Thursday night. Patient states decrease in po intake. Patient reports that he vomited about every hour yesterday. Denies diarrhea.

## 2016-03-06 NOTE — Consult Note (Signed)
Medical Consultation  Keith Bryant [id number] DOB: 03/09/1931 DOA: 03/06/2016 PCP: Leonel Ramsay, MD   Requesting physician: Dr Heath Lark Date of consultation: 03/06/2016 Reason for consultation: medical   Impression/Recommendations 82 year old male with history of hypertension and hyperlipidemia who presented with intractable vomiting since Thursday night and found to have mid to distal small bowel obstruction due to adhesions on CT scan.  1. Essential hypertension: Patient currently on HCTZ and verapamil at home. When necessary hydralazine ordered with parameters.  2. Hyperlipidemia: Atorvastatin can be held until patient is able to tolerate oral intake.  3. BPH: Continue Flomax.  4. Acute kidney injury: This is due to decreased by mouth intake due to small bowel obstruction and vomiting. Start IV fluids and repeat BMP in a.m.  5. Hypokalemia: Due to vomiting. Replete and recheck in a.m.   6. Mid to distal small bowel obstruction: Management as per surgery If surgery is needed patient is moderate risk for moderate risk procedure. Patient may proceed to surgery without further cardiac workup.   Chief Complaint: Nausea and vomiting since Thursday night  HPI:   82 year old male with a history of hypertension and hyperlipidemia who presents with intractable vomiting since Thursday night. Patient is also complaining of abdominal distention with generalized pain and belching. He also reports decreased flatus. Due to pain and nausea patient is unable to tolerate oral intake.  CT scan in the emergency room shows mid to distal small bowel dissection. NG tube will be placed. He has been evaluated by surgical services. Conservative management is recommended at this time.  Review of Systems  Constitutional: Negative for fever, chills weight loss HENT: Negative for ear pain, nosebleeds, congestion, facial swelling, rhinorrhea, neck pain, neck stiffness and ear discharge.    Respiratory: Negative for cough, shortness of breath, wheezing  Cardiovascular: Negative for chest pain, palpitations and leg swelling.  Gastrointestinal: Negative for heartburn, ++generalized abdominal pain andvomiting, NO diarrhea or consitpation Genitourinary: Negative for dysuria, urgency, frequency, hematuria Musculoskeletal: Negative for back pain or joint pain Neurological: Negative for dizziness, seizures, syncope, focal weakness,  numbness and headaches.  Hematological: Does not bruise/bleed easily.  Psychiatric/Behavioral: Negative for hallucinations, confusion, dysphoric mood   Past Medical History  Diagnosis Date  . Hypertension   . Diverticulitis   . Hypercholesteremia   . Skin cancer   . Lower extremity edema    Past Surgical History  Procedure Laterality Date  . Colon resection    . Colonoscopy    . Hernia repair    . Cataract extraction w/phaco Right 09/18/2015    Procedure: CATARACT EXTRACTION PHACO AND INTRAOCULAR LENS PLACEMENT (IOC);  Surgeon: Leandrew Koyanagi, MD;  Location: [facility] ORS;  Service: Ophthalmology;  Laterality: Right;  US00:57.4 AP12.5 CDE7.15 FLUID LOT [id number] H  . Tonsillectomy    . Cataract extraction w/phaco Left 10/16/2015    Procedure: CATARACT EXTRACTION PHACO AND INTRAOCULAR LENS PLACEMENT (IOC);  Surgeon: Leandrew Koyanagi, MD;  Location: [facility] ORS;  Service: Ophthalmology;  Laterality: Left;  [place]   1:02.9 AP%  12.5 CDE    8.81 casette lot # [id number] h   exp 02/10/2017   Social History:  No tobacco, IV drug use or EtOH.  No Known Allergies  Family history: Brother with pancreatic cancer and sister with breast cancer Father with CAD  Prior to Admission medications   Medication Sig Start Date End Date Taking? Authorizing Provider  aspirin 81 MG tablet Take 81 mg by mouth daily as needed for pain.   Yes Historical Provider, MD  atorvastatin (LIPITOR) 20 MG tablet Take 20 mg by mouth at bedtime.    Yes Historical Provider, MD   hydrochlorothiazide (HYDRODIURIL) 25 MG tablet Take 12.5 mg by mouth daily.    Yes Historical Provider, MD  Magnesium 400 MG TABS Take 400 mg by mouth daily.    Yes Historical Provider, MD  Multiple Vitamins-Minerals (CENTRUM SILVER PO) Take 1 tablet by mouth daily.   Yes Historical Provider, MD  oxybutynin (DITROPAN-XL) 5 MG 24 hr tablet Take 5 mg by mouth daily.    Yes Historical Provider, MD  POTASSIUM PO Take 100 mg by mouth daily.    Yes Historical Provider, MD  tamsulosin (FLOMAX) 0.4 MG CAPS capsule Take 0.4 mg by mouth daily.   Yes Historical Provider, MD  verapamil (CALAN-SR) 240 MG CR tablet Take 240 mg by mouth 2 (two) times daily.   Yes Historical Provider, MD  vitamin B-12 (CYANOCOBALAMIN) 1000 MCG tablet Take 1,000 mcg by mouth daily.   Yes Historical Provider, MD    Physical Exam: Blood pressure 131/67, pulse 76, temperature 98.6 F (37 C), temperature source Oral, resp. rate 28, height 6' (1.829 m), weight 111.131 kg (245 lb), SpO2 93 %. @VITALS2 @ [employer]   03/06/16 0443  Weight: 111.131 kg (245 lb)   No intake or output data in the 24 hours ending 03/06/16 1015   Constitutional: Appears well-developed and well-nourished. No distress. HENT: Normocephalic. Marland Kitchen Oropharynx is clear and moist.  Eyes: Conjunctivae and EOM are normal. PERRLA, no scleral icterus.  Neck: Normal ROM. Neck supple. No JVD. No tracheal deviation. CVS: RRR, S1/S2 +, no murmurs, no gallops, no carotid bruit.  Pulmonary: Effort and breath sounds normal, no stridor, rhonchi, wheezes, rales.  Abdominal: Hypoactive bowel sounds with distention, tense no rebound or guarding.  Musculoskeletal: Normal range of motion. No edema and no tenderness.  Neuro: Alert. CN 2-12 grossly intact. No focal deficits. Skin: Skin is warm and dry. No rash noted. he has a lipoma/fat pad at the base of his neck side collarbone on the right side.  Psychiatric: Normal mood and affect.    Labs  Basic Metabolic  Panel:  Recent Labs Lab 03/06/16 0451  NA 139  K 3.3*  CL 97*  CO2 26  GLUCOSE 205*  BUN 35*  CREATININE 1.70*  CALCIUM 9.7   Liver Function Tests:  Recent Labs Lab 03/06/16 0451  AST 27  ALT 18  ALKPHOS 35*  BILITOT 1.8*  PROT 8.1  ALBUMIN 4.4    Recent Labs Lab 03/06/16 0451  LIPASE 19    CBC:  Recent Labs Lab 03/06/16 0451  WBC 6.4  HGB 17.7  HCT 51.3  MCV 90.2  PLT 117*   Cardiac Enzymes: No results for input(s): CKTOTAL, CKMB, CKMBINDEX, TROPONINI in the last 168 hours. BNP: Invalid input(s): POCBNP CBG: No results for input(s): GLUCAP in the last 168 hours.  Radiological Exams: Ct Abdomen Pelvis W Contrast  03/06/2016  CLINICAL DATA:  Intractable vomiting.  Mild abdominal pain. EXAM: CT ABDOMEN AND PELVIS WITH CONTRAST TECHNIQUE: Multidetector CT imaging of the abdomen and pelvis was performed using the standard protocol following bolus administration of intravenous contrast. CONTRAST:  52mL ISOVUE-300 IOPAMIDOL (ISOVUE-300) INJECTION 61% COMPARISON:  09/09/2007 CT abdomen/pelvis. FINDINGS: Lower chest: No significant pulmonary nodules or acute consolidative airspace disease. Mild bibasilar scarring versus atelectasis. Stable focal calcification in the left pericardium. Right coronary atherosclerosis. Hepatobiliary: Normal size liver. Stable coarse calcification at the posterior right liver dome, probably from prior injury  or granulomatous disease. Hypodense 0.4 cm lesion at the right liver dome, too small to characterize. No additional liver lesions. Normal gallbladder with no radiopaque cholelithiasis. No biliary ductal dilatation. Pancreas: Normal, with no mass or duct dilation. Spleen: Normal size. No mass. Adrenals/Urinary Tract: Right adrenal 1.5 cm myelolipoma. Normal left adrenal. No hydronephrosis. Nonobstructing adjacent 7 mm and 3 mm stones in the interpolar left kidney. Simple appearing 7.9 cm renal cyst in the posterior interpolar left kidney.  No additional renal lesions. Normal bladder. Stomach/Bowel: Mildly distended fluid-filled stomach with no gastric wall thickening. Patient is status post ileocecal resection with intact appearing neo ileocolic anastomosis in the right abdomen. There is mild-to-moderate dilatation of the fluid-filled proximal and mid small bowel with suggestion of a focal caliber transition in the mid to distal small bowel (series 2/image 50 and series 5/ image 19). No small bowel wall thickening, pneumatosis or mass. Findings suggest mid to distal mechanical small bowel obstruction due to adhesions. Marked sigmoid diverticulosis, with no wall thickening or pericolonic fat stranding in the remnant large-bowel. Vascular/Lymphatic: Atherosclerotic nonaneurysmal abdominal aorta. Patent portal, splenic, hepatic and renal veins. No pathologically enlarged lymph nodes in the abdomen or pelvis. Reproductive: Normal size prostate with nonspecific internal prostatic calcifications. Other: No pneumoperitoneum, ascites or focal fluid collection. Musculoskeletal: No aggressive appearing focal osseous lesions. Moderate degenerative changes in the visualized thoracolumbar spine. IMPRESSION: 1. CT findings suggest mechanical mid to distal small bowel obstruction due to adhesions. No bowel wall thickening, pneumatosis or pneumoperitoneum. 2. 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 3. Marked sigmoid diverticulosis with no evidence of acute diverticulitis. 4. Nonobstructing left renal stones. 5. Aortic atherosclerosis.  Coronary atherosclerosis. 6. Right adrenal myelolipoma. Electronically Signed   By: Ilona Sorrel M.D.   On: 03/06/2016 09:13   Dg Abd Acute W/chest  03/06/2016  CLINICAL DATA:  82 year old male with persistent vomiting and abdominal pain EXAM: DG ABDOMEN ACUTE W/ 1V CHEST COMPARISON:  CT of the abdomen pelvis dated 09/09/2007 FINDINGS: Left lung base atelectatic changes noted. The lungs are otherwise clear.  There is no pleural effusion pneumothorax. Top-normal cardiac silhouette. The aorta is tortuous. Multiple dilated air-filled loops of small bowel noted within the pelvis measuring up to 5.3 cm in diameter. There is air-fluid level within the small bowel. Air is noted within the colon. Right lower quadrant surgical clips noted. A 6 mm radiopaque density over the right renal silhouette may represent a renal calculus versus a colonic diverticulith. There is no free air. There is degenerative changes of the spine. No acute fracture. IMPRESSION: Dilated air-filled loops of small bowel may represent small bowel obstruction versus ileus. Clinical correlation and follow-up recommended. Electronically Signed   By: Anner Crete M.D.   On: 03/06/2016 06:57    EKG: Sinus rhythm with atrial premature complexes  Thank you for allowing me to participate in the care of your patient. We will continue to follow.   Note: This dictation was prepared with Dragon dictation along with smaller phrase technology. Any transcriptional errors that result from this process are unintentional.  Time spent: 50 minutes  Lataunya Ruud, MD

## 2016-03-06 NOTE — H&P (Signed)
Keith Bryant. is an 82 y.o. male.   Chief Complaint: abdomoinal pain, n/v HPI: 82yrold male with nausea and vomiting since Thursday.  [place] food and drinking more soda than normal.  He states that he did have to urinate much more and was feeling more dehydrated and like his mouth was dry. He has not felt that before and states that he does not have diabetes and hasn't been diagnosed as such, although documented in his PCP notes since 2015.  Patient states that he then returned home and felt more tired than normal but then started to have nausea and vomiting.  He states that he has vomited multiple times and copious amounts of vomit without relief since Thursday evening.  He denies any sick contacts, fever or chills.  He states that it has been a couple days since he has had a BM but cannot remember flatus.  He denies any abdominal pain, dysuria, flank pain, or hematuria.   Past Medical History  Diagnosis Date  . Hypertension   . Diverticulitis   . Hypercholesteremia   . Skin cancer   . Lower extremity edema     Past Surgical History  Procedure Laterality Date  . Colon resection    . Colonoscopy    . Hernia repair    . Cataract extraction w/phaco Right 09/18/2015    Procedure: CATARACT EXTRACTION PHACO AND INTRAOCULAR LENS PLACEMENT (IOC);  Surgeon: CLeandrew Koyanagi MD;  Location: [facility] ORS;  Service: Ophthalmology;  Laterality: Right;  US00:57.4 AP12.5 CDE7.15 FLUID LOT [id number]  . Tonsillectomy    . Cataract extraction w/phaco Left 10/16/2015    Procedure: CATARACT EXTRACTION PHACO AND INTRAOCULAR LENS PLACEMENT (IOC);  Surgeon: CLeandrew Koyanagi MD;  Location: [facility] ORS;  Service: Ophthalmology;  Laterality: Left;  [place]  1:02.9 AP%  12.5 CDE    8.81 casette lot # [id number]   exp 02/10/2017    Family History  Problem Relation Age of Onset  . Heart disease Father   . Cancer Father   . Cancer Sister     Social History:  reports that he has quit smoking. He does not have any smokeless tobacco history on file. He reports that he drinks alcohol. He reports that he does not use illicit drugs.  Allergies: No Known Allergies  Medications Prior to Admission  Medication Sig Dispense Refill  . aspirin 81 MG tablet Take 81 mg by mouth daily as needed for pain.    .Marland Kitchenatorvastatin (LIPITOR) 20 MG tablet Take 20 mg by mouth at bedtime.     . hydrochlorothiazide (HYDRODIURIL) 25 MG tablet Take 12.5 mg by mouth daily.     . Magnesium 400 MG TABS Take 400 mg by mouth daily.     . Multiple Vitamins-Minerals (CENTRUM SILVER PO) Take 1 tablet by mouth daily.    .Marland Kitchenoxybutynin (DITROPAN-XL) 5 MG 24 hr tablet Take 5 mg by mouth daily.     .Marland KitchenPOTASSIUM PO Take 100 mg by mouth daily.     . tamsulosin (FLOMAX) 0.4 MG CAPS capsule Take 0.4 mg by mouth daily.    . verapamil (CALAN-SR) 240 MG CR tablet Take 240 mg by mouth 2 (two) times daily.    . vitamin B-12 (CYANOCOBALAMIN) 1000 MCG tablet Take 1,000 mcg by mouth daily.      Results for orders placed or performed during the hospital encounter of 03/06/16 (from the past 48  hour(s))  Lipase, blood     Status: None   Collection Time: 03/06/16  4:51 AM  Result Value Ref Range   Lipase 19 11 - 51 U/L  Comprehensive metabolic panel     Status: Abnormal   Collection Time: 03/06/16  4:51 AM  Result Value Ref Range   Sodium 139 135 - 145 mmol/L   Potassium 3.3 (L) 3.5 - 5.1 mmol/L   Chloride 97 (L) 101 - 111 mmol/L   CO2 26 22 - 32 mmol/L   Glucose, Bld 205 (H) 65 - 99 mg/dL   BUN 35 (H) 6 - 20 mg/dL   Creatinine, Ser 1.70 (H) 0.61 - 1.24 mg/dL   Calcium 9.7 8.9 - 10.3 mg/dL   Total Protein 8.1 6.5 - 8.1 g/dL   Albumin 4.4 3.5 - 5.0 g/dL   AST 27 15 - 41 U/L   ALT 18 17 - 63 U/L   Alkaline Phosphatase 35 (L) 38 - 126 U/L   Total Bilirubin 1.8 (H) 0.3 - 1.2 mg/dL   GFR calc non Af Amer 35 (L) >60 mL/min   GFR calc Af Amer 41 (L) >60 mL/min    Comment:  (NOTE) The eGFR has been calculated using the CKD EPI equation. This calculation has not been validated in all clinical situations. eGFR's persistently <60 mL/min signify possible Chronic Kidney Disease.    Anion gap 16 (H) 5 - 15  CBC     Status: Abnormal   Collection Time: 03/06/16  4:51 AM  Result Value Ref Range   WBC 6.4 3.8 - 10.6 K/uL   RBC 5.69 4.40 - 5.90 MIL/uL   Hemoglobin 17.7 13.0 - 18.0 g/dL   HCT 51.3 40.0 - 52.0 %   MCV 90.2 80.0 - 100.0 fL   MCH 31.1 26.0 - 34.0 pg   MCHC 34.4 32.0 - 36.0 g/dL   RDW 14.5 11.5 - 14.5 %   Platelets 117 (L) 150 - 440 K/uL   Ct Abdomen Pelvis W Contrast  03/06/2016  CLINICAL DATA:  Intractable vomiting.  Mild abdominal pain. EXAM: CT ABDOMEN AND PELVIS WITH CONTRAST TECHNIQUE: Multidetector CT imaging of the abdomen and pelvis was performed using the standard protocol following bolus administration of intravenous contrast. CONTRAST:  45m ISOVUE-300 IOPAMIDOL (ISOVUE-300) INJECTION 61% COMPARISON:  09/09/2007 CT abdomen/pelvis. FINDINGS: Lower chest: No significant pulmonary nodules or acute consolidative airspace disease. Mild bibasilar scarring versus atelectasis. Stable focal calcification in the left pericardium. Right coronary atherosclerosis. Hepatobiliary: Normal size liver. Stable coarse calcification at the posterior right liver dome, probably from prior injury or granulomatous disease. Hypodense 0.4 cm lesion at the right liver dome, too small to characterize. No additional liver lesions. Normal gallbladder with no radiopaque cholelithiasis. No biliary ductal dilatation. Pancreas: Normal, with no mass or duct dilation. Spleen: Normal size. No mass. Adrenals/Urinary Tract: Right adrenal 1.5 cm myelolipoma. Normal left adrenal. No hydronephrosis. Nonobstructing adjacent 7 mm and 3 mm stones in the interpolar left kidney. Simple appearing 7.9 cm renal cyst in the posterior interpolar left kidney. No additional renal lesions. Normal bladder.  Stomach/Bowel: Mildly distended fluid-filled stomach with no gastric wall thickening. Patient is status post ileocecal resection with intact appearing neo ileocolic anastomosis in the right abdomen. There is mild-to-moderate dilatation of the fluid-filled proximal and mid small bowel with suggestion of a focal caliber transition in the mid to distal small bowel (series 2/image 50 and series 5/ image 19). No small bowel wall thickening, pneumatosis or mass. Findings suggest mid  to distal mechanical small bowel obstruction due to adhesions. Marked sigmoid diverticulosis, with no wall thickening or pericolonic fat stranding in the remnant large-bowel. Vascular/Lymphatic: Atherosclerotic nonaneurysmal abdominal aorta. Patent portal, splenic, hepatic and renal veins. No pathologically enlarged lymph nodes in the abdomen or pelvis. Reproductive: Normal size prostate with nonspecific internal prostatic calcifications. Other: No pneumoperitoneum, ascites or focal fluid collection. Musculoskeletal: No aggressive appearing focal osseous lesions. Moderate degenerative changes in the visualized thoracolumbar spine. IMPRESSION: 1. CT findings suggest mechanical mid to distal small bowel obstruction due to adhesions. No bowel wall thickening, pneumatosis or pneumoperitoneum. 2. 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 3. Marked sigmoid diverticulosis with no evidence of acute diverticulitis. 4. Nonobstructing left renal stones. 5. Aortic atherosclerosis.  Coronary atherosclerosis. 6. Right adrenal myelolipoma. Electronically Signed   By: Ilona Sorrel M.D.   On: 03/06/2016 09:13   Dg Abd Acute W/chest  03/06/2016  CLINICAL DATA:  82 year old male with persistent vomiting and abdominal pain EXAM: DG ABDOMEN ACUTE W/ 1V CHEST COMPARISON:  CT of the abdomen pelvis dated 09/09/2007 FINDINGS: Left lung base atelectatic changes noted. The lungs are otherwise clear. There is no pleural effusion pneumothorax.  Top-normal cardiac silhouette. The aorta is tortuous. Multiple dilated air-filled loops of small bowel noted within the pelvis measuring up to 5.3 cm in diameter. There is air-fluid level within the small bowel. Air is noted within the colon. Right lower quadrant surgical clips noted. A 6 mm radiopaque density over the right renal silhouette may represent a renal calculus versus a colonic diverticulith. There is no free air. There is degenerative changes of the spine. No acute fracture. IMPRESSION: Dilated air-filled loops of small bowel may represent small bowel obstruction versus ileus. Clinical correlation and follow-up recommended. Electronically Signed   By: Anner Crete M.D.   On: 03/06/2016 06:57   Dg Abd Portable 1v  03/06/2016  CLINICAL DATA:  Nasogastric tube placement EXAM: PORTABLE ABDOMEN - 1 VIEW COMPARISON:  Portable exam 1039 hours compared to CT of 0845 hours. FINDINGS: Tip of nasogastric tube projects over proximal to mid stomach. Scattered atherosclerotic calcification. Bowel loops suboptimally assessed due to technique. Bones demineralized. IMPRESSION: Tip of nasogastric tube projects over proximal to mid stomach. Electronically Signed   By: Lavonia Dana M.D.   On: 03/06/2016 11:18    Review of Systems  Constitutional: Positive for malaise/fatigue. Negative for fever and chills.  HENT: Negative for congestion and sore throat.   Respiratory: Negative for cough, shortness of breath and wheezing.   Cardiovascular: Negative for chest pain, palpitations and leg swelling.  Gastrointestinal: Positive for heartburn, nausea, vomiting and abdominal pain. Negative for diarrhea, constipation, blood in stool and melena.  Genitourinary: Positive for urgency and frequency. Negative for dysuria, hematuria and flank pain.  Musculoskeletal: Negative for back pain, joint pain and neck pain.  Skin: Negative for itching and rash.  Neurological: Negative for dizziness, loss of consciousness and  weakness.  Psychiatric/Behavioral: Negative for depression. The patient is not nervous/anxious.   All other systems reviewed and are negative.   Blood pressure 117/68, pulse 94, temperature 97.6 F (36.4 C), temperature source Oral, resp. rate 17, height 6' (1.829 m), weight 245 lb (111.131 kg), SpO2 95 %. Physical Exam  Vitals reviewed. Constitutional: He is oriented to person, place, and time. He appears well-developed and well-nourished. No distress.  HENT:  Head: Normocephalic and atraumatic.  Right Ear: External ear normal.  Left Ear: External ear normal.  Nose: Nose normal.  Mouth/Throat:  Oropharynx is clear and moist. No oropharyngeal exudate.  Eyes: Conjunctivae and EOM are normal. Pupils are equal, round, and reactive to light. No scleral icterus.  Neck: Normal range of motion. Neck supple. No tracheal deviation present.  Cardiovascular: Normal rate, regular rhythm, normal heart sounds and intact distal pulses.  Exam reveals no gallop and no friction rub.   No murmur heard. Respiratory: Effort normal and breath sounds normal. No respiratory distress. He has no wheezes. He has no rales.  GI: Soft. He exhibits distension. There is no tenderness. There is no rebound and no guarding.  Hypoactive bowel sounds, moderately distended, non-tender  Musculoskeletal: Normal range of motion. He exhibits no edema or tenderness.  Neurological: He is alert and oriented to person, place, and time. No cranial nerve deficit.  Skin: Skin is warm and dry. No rash noted. No erythema. No pallor.  Psychiatric: He has a normal mood and affect. His behavior is normal. Judgment and thought content normal.     Assessment/Plan 82 yr old male with small bowel obstruction.  I have personally reviewed his past medical history and past appointments with PCP which show HTN, HLD and some diabetes mellitus however diet controlled not on medications and previous colon resection with Dr. Pat Patrick for diverticulitis. I  have personally reviewed his laboratory values which show some mild elevation in creatine due to dehydration and hypokalemia.  I have personally reviewed his CT scan images which show mild dilation in small bowel and stomach without a frank transition point, no fluid and no signs of ischemia or pneumatosis.  I also reviewed the radiology read as above.  I discuss with he patient that we should try to place and NG tube, rehydrate with IV fluids and will have medicine consult to help treat HTN and possible DM as well.  I discussed with patient and wife that about 85% of the time the SBO will resolve with IV fluids and NG tube that he will require a few days in the hospital and may still need operation if doesn't improve.  They were given the opportunity to ask questions and have them answered. THey are in agreement with the plan.   Hubbard Robinson, MD 03/06/2016, 1:14 PM

## 2016-03-07 ENCOUNTER — Inpatient Hospital Stay: Payer: Medicare Other

## 2016-03-07 DIAGNOSIS — E876 Hypokalemia: Secondary | ICD-10-CM

## 2016-03-07 LAB — CBC
HEMATOCRIT: 44.6 % (ref 40.0–52.0)
Hemoglobin: 15.1 g/dL (ref 13.0–18.0)
MCH: 31.1 pg (ref 26.0–34.0)
MCHC: 33.9 g/dL (ref 32.0–36.0)
MCV: 91.9 fL (ref 80.0–100.0)
Platelets: 98 10*3/uL — ABNORMAL LOW (ref 150–440)
RBC: 4.85 MIL/uL (ref 4.40–5.90)
RDW: 14.9 % — AB (ref 11.5–14.5)
WBC: 7.2 10*3/uL (ref 3.8–10.6)

## 2016-03-07 LAB — BASIC METABOLIC PANEL
Anion gap: 11 (ref 5–15)
BUN: 45 mg/dL — AB (ref 6–20)
CALCIUM: 8.4 mg/dL — AB (ref 8.9–10.3)
CO2: 30 mmol/L (ref 22–32)
CREATININE: 1.4 mg/dL — AB (ref 0.61–1.24)
Chloride: 101 mmol/L (ref 101–111)
GFR calc Af Amer: 52 mL/min — ABNORMAL LOW (ref >60)
GFR calc non Af Amer: 45 mL/min — ABNORMAL LOW (ref >60)
GLUCOSE: 114 mg/dL — AB (ref 65–99)
Potassium: 3 mmol/L — ABNORMAL LOW (ref 3.5–5.1)
Sodium: 142 mmol/L (ref 135–145)

## 2016-03-07 LAB — GLUCOSE, CAPILLARY
GLUCOSE-CAPILLARY: 100 mg/dL — AB (ref 65–99)
GLUCOSE-CAPILLARY: 103 mg/dL — AB (ref 65–99)
Glucose-Capillary: 113 mg/dL — ABNORMAL HIGH (ref 65–99)
Glucose-Capillary: 122 mg/dL — ABNORMAL HIGH (ref 65–99)

## 2016-03-07 LAB — MAGNESIUM: MAGNESIUM: 2 mg/dL (ref 1.7–2.4)

## 2016-03-07 MED ORDER — OXYBUTYNIN CHLORIDE ER 5 MG PO TB24
5.0000 mg | ORAL_TABLET | Freq: Every day | ORAL | Status: DC
Start: 2016-03-07 — End: 2016-03-11
  Administered 2016-03-08 – 2016-03-10 (×3): 5 mg via ORAL
  Filled 2016-03-07 (×3): qty 1

## 2016-03-07 MED ORDER — TAMSULOSIN HCL 0.4 MG PO CAPS
0.4000 mg | ORAL_CAPSULE | Freq: Every day | ORAL | Status: DC
  Administered 2016-03-07 – 2016-03-11 (×5): 0.4 mg via ORAL
  Filled 2016-03-07 (×5): qty 1

## 2016-03-07 MED ORDER — POTASSIUM CHLORIDE 10 MEQ/100ML IV SOLN
10.0000 meq | INTRAVENOUS | Status: AC
Start: 2016-03-07 — End: 2016-03-07
  Administered 2016-03-07 (×4): 10 meq via INTRAVENOUS
  Filled 2016-03-07 (×4): qty 100

## 2016-03-07 NOTE — Progress Notes (Signed)
82 yr old male with multiple medical issue and SBO.  He has ng tube in yesterday which drained well, but he had a lot of sinus drainage and coughed tube out last night, requiring replacement of the tube.  He did have some slight confusion this AM as well, but now knows where he is, he was moved closer to nursing station.  He denies any pain and states that he is passing flatus and had a small BM last night.   Filed Vitals:   03/07/16 0417 03/07/16 0417  BP: 117/43   Pulse: 60 61  Temp: 98.6 F (37 C)   Resp: 20    I/O last 3 completed shifts: In: 3298.2 [I.V.:3298.2] Out: [id number] [Urine:500; Emesis/NG output:500; Other:1750] Total I/O In: 517.5 [I.V.:417.5; IV Piggyback:100] Out: 100 [Urine:100]   PE:  Gen: NAD Res: CTAB/L  Cardio: RRR [id number], non-tender, mild distended but much improved from yesterday, well healed midline incision  Ext: no edema  CBC Latest Ref Rng 03/07/2016 03/06/2016  WBC 3.8 - 10.6 K/uL 7.2 6.4  Hemoglobin 13.0 - 18.0 g/dL 15.1 17.7  Hematocrit 40.0 - 52.0 % 44.6 51.3  Platelets 150 - 440 K/uL 98(L) 117(L)   CMP Latest Ref Rng 03/07/2016 03/06/2016  Glucose 65 - 99 mg/dL 114(H) 205(H)  BUN 6 - 20 mg/dL 45(H) 35(H)  Creatinine 0.61 - 1.24 mg/dL 1.40(H) 1.70(H)  Sodium 135 - 145 mmol/L 142 139  Potassium 3.5 - 5.1 mmol/L 3.0(L) 3.3(L)  Chloride 101 - 111 mmol/L 101 97(L)  CO2 22 - 32 mmol/L 30 26  Calcium 8.9 - 10.3 mg/dL 8.4(L) 9.7  Total Protein 6.5 - 8.1 g/dL - 8.1  Total Bilirubin 0.3 - 1.2 mg/dL - 1.8(H)  Alkaline Phos 38 - 126 U/L - 35(L)  AST 15 - 41 U/L - 27  ALT 17 - 63 U/L - 18    A/P: 82 yr old male with multiple medical issues and SBO.  Continue NG tube with NPO except ice chips and sips with meds, Xray still with dilated loops of small bowel, clinically improved today Continue IVF Hypokalemia: replete with IV potassium today Encourage ambulation  Appreciate medicine help with HTN and HLD meds ? Hyperglycemia verse DM as reported in PCP  notes, but well controlled without meds

## 2016-03-07 NOTE — Progress Notes (Signed)
Keith Bryant at [place] NAME: Keith Bryant    MR#:  [id number]  DATE OF BIRTH:  [birth year]  SUBJECTIVE:  CHIEF COMPLAINT:   Chief Complaint  Patient presents with  . Emesis  Patient is a 82 year old Caucasian male with past history significant for history of multiple abdominal surgical histories including colon resection, hernia repair, essential hypertension, hyperlipidemia who presents to the hospital with complaints of nausea and vomiting for the past 2 days. Also, abdominal distention, generalized pain and belching, decreased gas per rectum. The patient was not able to tolerate oral intake. On arrival to emergency room, he had CT scan which revealed mid to distal small bowel obstruction. NG tube was placed and about a liter of green fluid was suctioned over the past 24 hours. Patient started passing flatus, no bowel movements yet. He feels, however, better. Abdominal x-ray today revealed continued small bowel obstruction, increased from before.  Review of Systems  Constitutional: Negative for fever, chills and weight loss.  HENT: Negative for congestion.   Eyes: Negative for blurred vision and double vision.  Respiratory: Negative for cough, sputum production, shortness of breath and wheezing.   Cardiovascular: Negative for chest pain, palpitations, orthopnea, leg swelling and PND.  Gastrointestinal: Positive for abdominal pain and constipation. Negative for nausea, vomiting, diarrhea and blood in stool.  Genitourinary: Negative for dysuria, urgency, frequency and hematuria.  Musculoskeletal: Negative for falls.  Neurological: Negative for dizziness, tremors, focal weakness and headaches.  Endo/Heme/Allergies: Does not bruise/bleed easily.  Psychiatric/Behavioral: Negative for depression. The patient does not have insomnia.     VITAL SIGNS: Blood pressure 133/60, pulse 68, temperature 97.7 F (36.5 C), temperature source Oral, resp. rate  17, height 6' (1.829 m), weight 111.131 kg (245 lb), SpO2 93 %.  PHYSICAL EXAMINATION:   GENERAL:  82 y.o.-year-old patient lying in the bed with no acute distress.  EYES: Pupils equal, round, reactive to light and accommodation. No scleral icterus. Extraocular muscles intact.  HEENT: Head atraumatic, normocephalic. Oropharynx and nasopharynx clear.  NECK:  Supple, no jugular venous distention. No thyroid enlargement, no tenderness.  LUNGS: Normal breath sounds bilaterally, no wheezing, rales,rhonchi or crepitation. No use of accessory muscles of respiration.  CARDIOVASCULAR: S1, S2 normal. No murmurs, rubs, or gallops.  ABDOMEN: Soft, minimal discomfort on palpation ,  some distended. Bowel sounds present. No organomegaly or mass.  EXTREMITIES: No pedal edema, cyanosis, or clubbing.  NEUROLOGIC: Cranial nerves II through XII are intact. Muscle strength 5/5 in all extremities. Sensation intact. Gait not checked.  PSYCHIATRIC: The patient is alert and oriented x 3.  SKIN: No obvious rash, lesion, or ulcer.   ORDERS/RESULTS REVIEWED:   CBC  Recent Labs Lab 03/06/16 0451 03/07/16 0510  WBC 6.4 7.2  HGB 17.7 15.1  HCT 51.3 44.6  PLT 117* 98*  MCV 90.2 91.9  MCH 31.1 31.1  MCHC 34.4 33.9  RDW 14.5 14.9*   ------------------------------------------------------------------------------------------------------------------  Chemistries   Recent Labs Lab 03/06/16 0451 03/07/16 0510  NA 139 142  K 3.3* 3.0*  CL 97* 101  CO2 26 30  GLUCOSE 205* 114*  BUN 35* 45*  CREATININE 1.70* 1.40*  CALCIUM 9.7 8.4*  AST 27  --   ALT 18  --   ALKPHOS 35*  --   BILITOT 1.8*  --    ------------------------------------------------------------------------------------------------------------------ estimated creatinine clearance is 51.5 mL/min (by C-G formula based on Cr of  1.4). ------------------------------------------------------------------------------------------------------------------ No results for input(s): TSH, T4TOTAL, T3FREE,  THYROIDAB in the last 72 hours.  Invalid input(s): FREET3  Cardiac Enzymes No results for input(s): CKMB, TROPONINI, MYOGLOBIN in the last 168 hours.  Invalid input(s): CK ------------------------------------------------------------------------------------------------------------------ Invalid input(s): POCBNP ---------------------------------------------------------------------------------------------------------------  RADIOLOGY: Dg Abd 1 View  03/07/2016  CLINICAL DATA:  Nausea, vomiting for couple of days. Small bowel obstruction follow-up. History of colon resection, hernia repair. Nasogastric tube. EXAM: ABDOMEN - 1 VIEW COMPARISON:  Plain film dated 03/06/2016. Also CT abdomen dated 03/06/2016. FINDINGS: Distended gas-filled loops of small bowel are seen throughout the abdomen, consistent with continued small bowel obstruction. Suspect relatively decompressed small bowel loops in the right lower quadrant indicating the region of the obstructing transition zone. Surgical clips again noted within the right lower quadrant. Nasogastric tube tip is at the level of the gastroesophageal junction. IMPRESSION: 1. Continued evidence of small bowel obstruction, with prominently distended gas-filled loops of small bowel throughout the abdomen. The small bowel distention appears to have increased compared to yesterday's CT suggesting worsening obstruction. 2. Nasogastric tube tip at the gastroesophageal junction, with proximal side holes in the lower esophagus. Recommend advancing into the stomach. These results will be called to the ordering clinician or representative by the Radiologist Assistant, and communication documented in the PACS or zVision Dashboard. Electronically Signed   By: Franki Cabot M.D.   On: 03/07/2016 10:11   Ct  Abdomen Pelvis W Contrast  03/06/2016  CLINICAL DATA:  Intractable vomiting.  Mild abdominal pain. EXAM: CT ABDOMEN AND PELVIS WITH CONTRAST TECHNIQUE: Multidetector CT imaging of the abdomen and pelvis was performed using the standard protocol following bolus administration of intravenous contrast. CONTRAST:  71mL ISOVUE-300 IOPAMIDOL (ISOVUE-300) INJECTION 61% COMPARISON:  09/09/2007 CT abdomen/pelvis. FINDINGS: Lower chest: No significant pulmonary nodules or acute consolidative airspace disease. Mild bibasilar scarring versus atelectasis. Stable focal calcification in the left pericardium. Right coronary atherosclerosis. Hepatobiliary: Normal size liver. Stable coarse calcification at the posterior right liver dome, probably from prior injury or granulomatous disease. Hypodense 0.4 cm lesion at the right liver dome, too small to characterize. No additional liver lesions. Normal gallbladder with no radiopaque cholelithiasis. No biliary ductal dilatation. Pancreas: Normal, with no mass or duct dilation. Spleen: Normal size. No mass. Adrenals/Urinary Tract: Right adrenal 1.5 cm myelolipoma. Normal left adrenal. No hydronephrosis. Nonobstructing adjacent 7 mm and 3 mm stones in the interpolar left kidney. Simple appearing 7.9 cm renal cyst in the posterior interpolar left kidney. No additional renal lesions. Normal bladder. Stomach/Bowel: Mildly distended fluid-filled stomach with no gastric wall thickening. Patient is status post ileocecal resection with intact appearing neo ileocolic anastomosis in the right abdomen. There is mild-to-moderate dilatation of the fluid-filled proximal and mid small bowel with suggestion of a focal caliber transition in the mid to distal small bowel (series 2/image 50 and series 5/ image 19). No small bowel wall thickening, pneumatosis or mass. Findings suggest mid to distal mechanical small bowel obstruction due to adhesions. Marked sigmoid diverticulosis, with no wall thickening or  pericolonic fat stranding in the remnant large-bowel. Vascular/Lymphatic: Atherosclerotic nonaneurysmal abdominal aorta. Patent portal, splenic, hepatic and renal veins. No pathologically enlarged lymph nodes in the abdomen or pelvis. Reproductive: Normal size prostate with nonspecific internal prostatic calcifications. Other: No pneumoperitoneum, ascites or focal fluid collection. Musculoskeletal: No aggressive appearing focal osseous lesions. Moderate degenerative changes in the visualized thoracolumbar spine. IMPRESSION: 1. CT findings suggest mechanical mid to distal small bowel obstruction due to adhesions. No bowel wall thickening, pneumatosis or pneumoperitoneum. 2. Intact appearing neo ileocolic anastomosis in  the right abdomen. 3. Marked sigmoid diverticulosis with no evidence of acute diverticulitis. 4. Nonobstructing left renal stones. 5. Aortic atherosclerosis.  Coronary atherosclerosis. 6. Right adrenal myelolipoma. Electronically Signed   By: Ilona Sorrel M.D.   On: 03/06/2016 09:13   Dg Abd Acute W/chest  03/06/2016  CLINICAL DATA:  82 year old male with persistent vomiting and abdominal pain EXAM: DG ABDOMEN ACUTE W/ 1V CHEST COMPARISON:  CT of the abdomen pelvis dated 09/09/2007 FINDINGS: Left lung base atelectatic changes noted. The lungs are otherwise clear. There is no pleural effusion pneumothorax. Top-normal cardiac silhouette. The aorta is tortuous. Multiple dilated air-filled loops of small bowel noted within the pelvis measuring up to 5.3 cm in diameter. There is air-fluid level within the small bowel. Air is noted within the colon. Right lower quadrant surgical clips noted. A 6 mm radiopaque density over the right renal silhouette may represent a renal calculus versus a colonic diverticulith. There is no free air. There is degenerative changes of the spine. No acute fracture. IMPRESSION: Dilated air-filled loops of small bowel may represent small bowel obstruction versus ileus. Clinical  correlation and follow-up recommended. Electronically Signed   By: Anner Crete M.D.   On: 03/06/2016 06:57   Dg Abd Portable 1v  03/06/2016  CLINICAL DATA:  Nasogastric tube placement EXAM: PORTABLE ABDOMEN - 1 VIEW COMPARISON:  Portable exam 1039 hours compared to CT of 0845 hours. FINDINGS: Tip of nasogastric tube projects over proximal to mid stomach. Scattered atherosclerotic calcification. Bowel loops suboptimally assessed due to technique. Bones demineralized. IMPRESSION: Tip of nasogastric tube projects over proximal to mid stomach. Electronically Signed   By: Lavonia Dana M.D.   On: 03/06/2016 11:18    EKG:  Orders placed or performed during the hospital encounter of 03/06/16  . ED EKG  . ED EKG    ASSESSMENT AND PLAN:  Principal Problem:   Small bowel obstruction (HCC) Active Problems:   Blood glucose elevated   HLD (hyperlipidemia)   BP (high blood pressure)   AKI (acute kidney injury) (HCC)   Uncontrollable vomiting   Hypokalemia #1. Small bowel obstruction, seems to worsen despite NG tube placement, NG tube needs to be advanced per radiologist report, supportive therapy with IV fluids, pain medications, further therapy per surgery #2. Essential hypertension, patient is not hypertensive at present, continue current therapy with hydralazine intravenously as needed #3. Hypokalemia, supplementing intravenously, magnesium level is ordered, supplement as needed #4 acute renal insufficiency, improving with IV fluid administration, follow in the morning #5. Thrombocytopenia, likely consumption, follow in the morning, discontinue Lovenox, initiate SCDs #6 . BPH with bladder instability, resume tamsulosin and Ditropan, getting bladder scan, postvoidal Management plans discussed with the patient, family and they are in agreement.   DRUG ALLERGIES: No Known Allergies  CODE STATUS:     Code Status Orders        Start     Ordered   03/06/16 1019  Full code   Continuous      03/06/16 1022    Code Status History    Date Active Date Inactive Code Status Order ID Comments User Context   This patient has a current code status but no historical code status.      TOTAL TIME TAKING CARE OF THIS PATIENT: 40 minutes.  Discussed with patient's family  Challis Crill M.D on 03/07/2016 at 4:05 PM  Between 7am to 6pm - Pager - [phone]  After 6pm go to [email or web address] - password [email or web address] [facility]  Office  [phone]  CC: Primary care physician; Leonel Ramsay, MD

## 2016-03-08 ENCOUNTER — Inpatient Hospital Stay: Payer: Medicare Other

## 2016-03-08 LAB — BASIC METABOLIC PANEL
ANION GAP: 12 (ref 5–15)
BUN: 27 mg/dL — AB (ref 6–20)
CHLORIDE: 101 mmol/L (ref 101–111)
CO2: 29 mmol/L (ref 22–32)
Calcium: 8.6 mg/dL — ABNORMAL LOW (ref 8.9–10.3)
Creatinine, Ser: 1.05 mg/dL (ref 0.61–1.24)
GFR calc Af Amer: >60 mL/min (ref >60)
GFR calc non Af Amer: >60 mL/min (ref >60)
Glucose, Bld: 107 mg/dL — ABNORMAL HIGH (ref 65–99)
POTASSIUM: 3 mmol/L — AB (ref 3.5–5.1)
SODIUM: 142 mmol/L (ref 135–145)

## 2016-03-08 LAB — GLUCOSE, CAPILLARY
GLUCOSE-CAPILLARY: 100 mg/dL — AB (ref 65–99)
GLUCOSE-CAPILLARY: 85 mg/dL (ref 65–99)
Glucose-Capillary: 102 mg/dL — ABNORMAL HIGH (ref 65–99)
Glucose-Capillary: 102 mg/dL — ABNORMAL HIGH (ref 65–99)
Glucose-Capillary: 93 mg/dL (ref 65–99)

## 2016-03-08 LAB — CBC
HCT: 44.6 % (ref 40.0–52.0)
Hemoglobin: 15.4 g/dL (ref 13.0–18.0)
MCH: 31.5 pg (ref 26.0–34.0)
MCHC: 34.6 g/dL (ref 32.0–36.0)
MCV: 91 fL (ref 80.0–100.0)
Platelets: 90 K/uL — ABNORMAL LOW (ref 150–440)
RBC: 4.9 MIL/uL (ref 4.40–5.90)
RDW: 14.5 % (ref 11.5–14.5)
WBC: 7 K/uL (ref 3.8–10.6)

## 2016-03-08 MED ORDER — POTASSIUM CHLORIDE 10 MEQ/100ML IV SOLN
10.0000 meq | INTRAVENOUS | Status: AC
  Administered 2016-03-08 (×4): 10 meq via INTRAVENOUS
  Filled 2016-03-08 (×4): qty 100

## 2016-03-08 NOTE — Care Management (Signed)
Patient presented to ED with abdominal pain and vomiting.  Imaging shows evidence of small bowel obstruction.  Patient has had nasogastric tube and npo.  Nasogastric tube dislodged this afternoon.  Will not be reinserted unless continues with vomiting and unable to tolerate pos Independent in all adls, denies issues accessing medical care, obtaining medications or with transportation.  Current with  PCP.  At present. no discharge needs identified at present by care manager or members of care team

## 2016-03-08 NOTE — Progress Notes (Signed)
CC: Small bowel obstruction Subjective: Per nursing staff patient removed his IVs and NG tube overnight requiring replacement. Patient reports improvement in his abdominal symptoms and that he has passed flatus. He denies any abdominal pain. Patient has multiple medical problems and has had some confusion during this admission however appears to be lucid at this point. Patient has not ambulated since admission.  Objective: Vital signs in last 24 hours: Temp:  [97.7 F (36.5 C)-99.4 F (37.4 C)] 97.8 F (36.6 C) (06/26 0532) Pulse Rate:  [57-68] 58 (06/26 0532) Resp:  [17-20] 18 (06/26 0532) BP: (133-164)/(45-61) 164/61 mmHg (06/26 0532) SpO2:  [93 %-94 %] 94 % (06/26 0532) Last BM Date: 03/07/16  Intake/Output from previous day: 06/25 0701 - 06/26 0700 In: 3358.5 [I.V.:2760.5; NG/GT:90; IV Piggyback:508] Out: 1700 [Urine:500; Emesis/NG output:1200] Intake/Output this shift: Total I/O In: -  Out: 200 [Urine:200]  Physical exam:  Gen.: No acute distress Chest: clear to auscultation Heart: Regular rate and rhythm Abdomen: Soft, nontender, mildly distended but with minimal tympany. Well-healed midline incision X-rays: Moves all extremities well with no palpable edema  Lab Results: CBC   Recent Labs  03/07/16 0510 03/08/16 0532  WBC 7.2 7.0  HGB 15.1 15.4  HCT 44.6 44.6  PLT 98* 90*   BMET  Recent Labs  03/07/16 0510 03/08/16 0532  NA 142 142  K 3.0* 3.0*  CL 101 101  CO2 30 29  GLUCOSE 114* 107*  BUN 45* 27*  CREATININE 1.40* 1.05  CALCIUM 8.4* 8.6*   PT/INR No results for input(s): LABPROT, INR in the last 72 hours. ABG No results for input(s): PHART, HCO3 in the last 72 hours.  Invalid input(s): PCO2, PO2  Studies/Results: Dg Abd 1 View  03/07/2016  CLINICAL DATA:  Nausea, vomiting for couple of days. Small bowel obstruction follow-up. History of colon resection, hernia repair. Nasogastric tube. EXAM: ABDOMEN - 1 VIEW COMPARISON:  Plain film dated  03/06/2016. Also CT abdomen dated 03/06/2016. FINDINGS: Distended gas-filled loops of small bowel are seen throughout the abdomen, consistent with continued small bowel obstruction. Suspect relatively decompressed small bowel loops in the right lower quadrant indicating the region of the obstructing transition zone. Surgical clips again noted within the right lower quadrant. Nasogastric tube tip is at the level of the gastroesophageal junction. IMPRESSION: 1. Continued evidence of small bowel obstruction, with prominently distended gas-filled loops of small bowel throughout the abdomen. The small bowel distention appears to have increased compared to yesterday's CT suggesting worsening obstruction. 2. Nasogastric tube tip at the gastroesophageal junction, with proximal side holes in the lower esophagus. Recommend advancing into the stomach. These results will be called to the ordering clinician or representative by the Radiologist Assistant, and communication documented in the PACS or zVision Dashboard. Electronically Signed   By: Franki Cabot M.D.   On: 03/07/2016 10:11   Dg Abd 2 Views  03/08/2016  CLINICAL DATA:  Nausea, vomiting.  Small bowel obstruction. EXAM: ABDOMEN - 2 VIEW COMPARISON:  03/07/2016 FINDINGS: NG tube tip is in the proximal to mid stomach. Dilated small bowel loops again noted compatible with small bowel obstruction. No real change. No free air organomegaly. IMPRESSION: Continued small bowel obstruction pattern. Electronically Signed   By: Rolm Baptise M.D.   On: 03/08/2016 07:42   Dg Abd Portable 1v  03/06/2016  CLINICAL DATA:  Nasogastric tube placement EXAM: PORTABLE ABDOMEN - 1 VIEW COMPARISON:  Portable exam 1039 hours compared to CT of 0845 hours. FINDINGS: Tip of nasogastric  tube projects over proximal to mid stomach. Scattered atherosclerotic calcification. Bowel loops suboptimally assessed due to technique. Bones demineralized. IMPRESSION: Tip of nasogastric tube projects over  proximal to mid stomach. Electronically Signed   By: Lavonia Dana M.D.   On: 03/06/2016 11:18    Anti-infectives: Anti-infectives    None      Assessment/Plan:  82 year old male with multiple medical problems and a small bowel obstruction. Appreciate medicine assistance with his medical problems. We will replace her calcium via the IV today. Discussed with the patient the importance of ambulation and that his NG tube could be clamped during ambulation. Difficult to assess NG output secondary to eating ice chips and drinking liquids. We will continue to trend his NG output as well as his physical exam. No current plans for operative intervention however, should his bowel obstruction failed to resolve over the next 24-48 hours he may require surgery urgently.  Shaneque Merkle T. Adonis Huguenin, MD, FACS  03/08/2016

## 2016-03-08 NOTE — Progress Notes (Signed)
Patient inadvertently pulled NG tube while moving. He is only had 250 mL out throughout the day. Provided him with the option of replacement now versus taking a wait and see approach. Patient elects to have the NG tube removed now with the understanding that should he develop any nausea or vomiting overnight that the NG tube will be replaced.  NG tube removed completely, continue nothing by mouth prior strips, encourage ambulation and since from her usage.  Clayburn Pert, MD FACS General Surgeon [facility]

## 2016-03-08 NOTE — Progress Notes (Signed)
NGT removed per MD order.

## 2016-03-08 NOTE — Care Management Important Message (Signed)
Important Message  Patient Details  Name: Keith Bryant. MRN: [id number] Date of Birth: 10-23-1930   Medicare Important Message Given:  Yes    Katrina Stack, RN 03/08/2016, 9:37 AM

## 2016-03-08 NOTE — Progress Notes (Signed)
[facility] [place] NAME: Keith Bryant    MR#:  [id number]  DATE OF BIRTH:  August 12, 1931  SUBJECTIVE:  CHIEF COMPLAINT:   Chief Complaint  Patient presents with  . Emesis  Patient is a 82 year old Caucasian male with past history significant for history of multiple abdominal surgical histories including colon resection, hernia repair, essential hypertension, hyperlipidemia who presents to the hospital with complaints of nausea and vomiting for the past 2 days. Also, abdominal distention, generalized pain and belching, decreased gas per rectum. The patient was not able to tolerate oral intake. On arrival to emergency room, he had CT scan which revealed mid to distal small bowel obstruction. NG tube was placed and about a liter of green fluid was suctioned over the past 24 hours. Patient started passing flatus, no bowel movements yet. He feels, however, better. Abdominal x-ray today revealed continued small bowel obstruction. The patient inadvertently removed NG tube, elected to keep it off for now unless nausea, vomiting recurs. Patient did have bowel movement today. Feels a little bit better overall after bowel movement  Review of Systems  Constitutional: Negative for fever, chills and weight loss.  HENT: Negative for congestion.   Eyes: Negative for blurred vision and double vision.  Respiratory: Negative for cough, sputum production, shortness of breath and wheezing.   Cardiovascular: Negative for chest pain, palpitations, orthopnea, leg swelling and PND.  Gastrointestinal: Positive for abdominal pain and constipation. Negative for nausea, vomiting, diarrhea and blood in stool.  Genitourinary: Negative for dysuria, urgency, frequency and hematuria.  Musculoskeletal: Negative for falls.  Neurological: Negative for dizziness, tremors, focal weakness and headaches.  Endo/Heme/Allergies: Does not bruise/bleed easily.  Psychiatric/Behavioral:  Negative for depression. The patient does not have insomnia.     VITAL SIGNS: Blood pressure 164/57, pulse 70, temperature 98.4 F (36.9 C), temperature source Oral, resp. rate 19, height 6' (1.829 m), weight 111.131 kg (245 lb), SpO2 96 %.  PHYSICAL EXAMINATION:   GENERAL:  82 y.o.-year-old patient lying in the bed with no acute distress.  EYES: Pupils equal, round, reactive to light and accommodation. No scleral icterus. Extraocular muscles intact.  HEENT: Head atraumatic, normocephalic. Oropharynx and nasopharynx clear.  NECK:  Supple, no jugular venous distention. No thyroid enlargement, no tenderness.  LUNGS: Normal breath sounds bilaterally, no wheezing, rales,rhonchi or crepitation. No use of accessory muscles of respiration.  CARDIOVASCULAR: S1, S2 normal. No murmurs, rubs, or gallops.  ABDOMEN: Soft, no thickened discomfort on palpation ,  less distention . Bowel sounds present, active. No organomegaly or mass.  EXTREMITIES: No pedal edema, cyanosis, or clubbing.  NEUROLOGIC: Cranial nerves II through XII are intact. Muscle strength 5/5 in all extremities. Sensation intact. Gait not checked.  PSYCHIATRIC: The patient is alert and oriented x 3.  SKIN: No obvious rash, lesion, or ulcer.   ORDERS/RESULTS REVIEWED:   CBC  Recent Labs Lab 03/06/16 0451 03/07/16 0510 03/08/16 0532  WBC 6.4 7.2 7.0  HGB 17.7 15.1 15.4  HCT 51.3 44.6 44.6  PLT 117* 98* 90*  MCV 90.2 91.9 91.0  MCH 31.1 31.1 31.5  MCHC 34.4 33.9 34.6  RDW 14.5 14.9* 14.5   ------------------------------------------------------------------------------------------------------------------  Chemistries   Recent Labs Lab 03/06/16 0451 03/07/16 0510 03/08/16 0532  NA 139 142 142  K 3.3* 3.0* 3.0*  CL 97* 101 101  CO2 26 30 29   GLUCOSE 205* 114* 107*  BUN 35* 45* 27*  CREATININE 1.70* 1.40* 1.05  CALCIUM 9.7  8.4* 8.6*  MG  --  2.0  --   AST 27  --   --   ALT 18  --   --   ALKPHOS 35*  --   --    BILITOT 1.8*  --   --    ------------------------------------------------------------------------------------------------------------------ estimated creatinine clearance is 68.6 mL/min (by C-G formula based on Cr of 1.05). ------------------------------------------------------------------------------------------------------------------ No results for input(s): TSH, T4TOTAL, T3FREE, THYROIDAB in the last 72 hours.  Invalid input(s): FREET3  Cardiac Enzymes No results for input(s): CKMB, TROPONINI, MYOGLOBIN in the last 168 hours.  Invalid input(s): CK ------------------------------------------------------------------------------------------------------------------ Invalid input(s): POCBNP ---------------------------------------------------------------------------------------------------------------  RADIOLOGY: Dg Abd 1 View  03/07/2016  CLINICAL DATA:  Nausea, vomiting for couple of days. Small bowel obstruction follow-up. History of colon resection, hernia repair. Nasogastric tube. EXAM: ABDOMEN - 1 VIEW COMPARISON:  Plain film dated 03/06/2016. Also CT abdomen dated 03/06/2016. FINDINGS: Distended gas-filled loops of small bowel are seen throughout the abdomen, consistent with continued small bowel obstruction. Suspect relatively decompressed small bowel loops in the right lower quadrant indicating the region of the obstructing transition zone. Surgical clips again noted within the right lower quadrant. Nasogastric tube tip is at the level of the gastroesophageal junction. IMPRESSION: 1. Continued evidence of small bowel obstruction, with prominently distended gas-filled loops of small bowel throughout the abdomen. The small bowel distention appears to have increased compared to yesterday's CT suggesting worsening obstruction. 2. Nasogastric tube tip at the gastroesophageal junction, with proximal side holes in the lower esophagus. Recommend advancing into the stomach. These results will be  called to the ordering clinician or representative by the Radiologist Assistant, and communication documented in the PACS or zVision Dashboard. Electronically Signed   By: Franki Cabot M.D.   On: 03/07/2016 10:11   Dg Abd 2 Views  03/08/2016  CLINICAL DATA:  Nausea, vomiting.  Small bowel obstruction. EXAM: ABDOMEN - 2 VIEW COMPARISON:  03/07/2016 FINDINGS: NG tube tip is in the proximal to mid stomach. Dilated small bowel loops again noted compatible with small bowel obstruction. No real change. No free air organomegaly. IMPRESSION: Continued small bowel obstruction pattern. Electronically Signed   By: Rolm Baptise M.D.   On: 03/08/2016 07:42    EKG:  Orders placed or performed during the hospital encounter of 03/06/16  . ED EKG  . ED EKG    ASSESSMENT AND PLAN:  Principal Problem:   Small bowel obstruction (HCC) Active Problems:   Blood glucose elevated   HLD (hyperlipidemia)   BP (high blood pressure)   AKI (acute kidney injury) (HCC)   Uncontrollable vomiting   Hypokalemia #1. Small bowel obstruction, Improved, patient accidentally took out NG tube , possible replacement if nausea, vomiting recurs, continue supportive therapy with IV fluids, pain medications, further therapy per surgery #2. Essential hypertension,  continue current therapy with hydralazine intravenously as needed, initiate oral medications if no nausea, vomiting #3. Hypokalemia, supplementing intravenously, magnesium level was normal #4 acute renal insufficiency, improved with IV fluid administration, follow closely #5. Thrombocytopenia, unclear etiology, seems to be decreasing despite discontinuation of Lovenox,  follow in the morning #6 . BPH with bladder instability, resume tamsulosin and Ditropan, ordered bladder scan, postvoidal, however, results are not available Management plans discussed with the patient, family and they are in agreement.   DRUG ALLERGIES: No Known Allergies  CODE STATUS:     Code  Status Orders        Start     Ordered   03/06/16 1019  Full code   Continuous     03/06/16 1022    Code Status History    Date Active Date Inactive Code Status Order ID Comments User Context   This patient has a current code status but no historical code status.      TOTAL TIME TAKING CARE OF THIS PATIENT: 30 minutes.    Theodoro Grist M.D on 03/08/2016 at 5:56 PM  Between 7am to 6pm - Pager - [phone]  After 6pm go to [email or web address] - password [email or web address] [facility]  [facility]  Office  [phone]  CC: Primary care physician; Leonel Ramsay, MD

## 2016-03-09 LAB — GLUCOSE, CAPILLARY
Glucose-Capillary: 92 mg/dL (ref 65–99)
Glucose-Capillary: 88 mg/dL (ref 65–99)

## 2016-03-09 LAB — PLATELET COUNT: PLATELETS: 99 10*3/uL — AB (ref 150–440)

## 2016-03-09 MED ORDER — POTASSIUM CHLORIDE IN NACL 20-0.9 MEQ/L-% IV SOLN
INTRAVENOUS | Status: DC
  Filled 2016-03-09 (×3): qty 1000

## 2016-03-09 NOTE — Progress Notes (Signed)
[facility] at [place] NAME: Keith Bryant    MR#:  [id number]  DATE OF BIRTH:  08/02/1931  SUBJECTIVE:  CHIEF COMPLAINT:   Chief Complaint  Patient presents with  . Emesis  Admitted with SBO. Feeling better today.  Review of Systems  Constitutional: Negative for fever, chills and weight loss.  HENT: Negative for congestion.   Eyes: Negative for blurred vision and double vision.  Respiratory: Negative for cough, sputum production, shortness of breath and wheezing.   Cardiovascular: Negative for chest pain, palpitations, orthopnea, leg swelling and PND.  Gastrointestinal: Positive for abdominal pain and constipation. Negative for nausea, vomiting, diarrhea and blood in stool.  Genitourinary: Negative for dysuria, urgency, frequency and hematuria.  Musculoskeletal: Negative for falls.  Neurological: Negative for dizziness, tremors, focal weakness and headaches.  Endo/Heme/Allergies: Does not bruise/bleed easily.  Psychiatric/Behavioral: Negative for depression. The patient does not have insomnia.     VITAL SIGNS: Blood pressure 154/65, pulse 65, temperature 97.4 F (36.3 C), temperature source Oral, resp. rate 18, height 6' (1.829 m), weight 111.131 kg (245 lb), SpO2 92 %.  PHYSICAL EXAMINATION:   GENERAL:  82 y.o.-year-old patient lying in the bed with no acute distress.  HEENT: Head atraumatic, normocephalic. Oropharynx and nasopharynx clear.  NECK:  Supple, no jugular venous distention. No thyroid enlargement, no tenderness.  LUNGS: Normal breath sounds bilaterally, no wheezing, rales,rhonchi or crepitation. No use of accessory muscles of respiration.  CARDIOVASCULAR: S1, S2 normal. No murmurs, rubs, or gallops.  ABDOMEN: Soft, no thickened discomfort on palpation ,  less distention . Bowel sounds present, active. No organomegaly or mass.  EXTREMITIES: No pedal edema, .  NEUROLOGIC: Cranial nerves II through XII are  intact.PSYCHIATRIC: The patient is alert and oriented x 3.     ORDERS/RESULTS REVIEWED:   CBC  Recent Labs Lab 03/06/16 0451 03/07/16 0510 03/08/16 0532 03/09/16 0352  WBC 6.4 7.2 7.0  --   HGB 17.7 15.1 15.4  --   HCT 51.3 44.6 44.6  --   PLT 117* 98* 90* 99*  MCV 90.2 91.9 91.0  --   MCH 31.1 31.1 31.5  --   MCHC 34.4 33.9 34.6  --   RDW 14.5 14.9* 14.5  --    ------------------------------------------------------------------------------------------------------------------  Chemistries   Recent Labs Lab 03/06/16 0451 03/07/16 0510 03/08/16 0532  NA 139 142 142  K 3.3* 3.0* 3.0*  CL 97* 101 101  CO2 26 30 29   GLUCOSE 205* 114* 107*  BUN 35* 45* 27*  CREATININE 1.70* 1.40* 1.05  CALCIUM 9.7 8.4* 8.6*  MG  --  2.0  --   AST 27  --   --   ALT 18  --   --   ALKPHOS 35*  --   --   BILITOT 1.8*  --   --    ------------------------------------------------------------------------------------------------------------------ estimated creatinine clearance is 68.6 mL/min (by C-G formula based on Cr of 1.05). ------------------------------------------------------------------------------------------------------------------ No results for input(s): TSH, T4TOTAL, T3FREE, THYROIDAB in the last 72 hours.  Invalid input(s): FREET3  Cardiac Enzymes No results for input(s): CKMB, TROPONINI, MYOGLOBIN in the last 168 hours.  Invalid input(s): CK ------------------------------------------------------------------------------------------------------------------ Invalid input(s): POCBNP ---------------------------------------------------------------------------------------------------------------  RADIOLOGY: Dg Abd 2 Views  03/08/2016  CLINICAL DATA:  Nausea, vomiting.  Small bowel obstruction. EXAM: ABDOMEN - 2 VIEW COMPARISON:  03/07/2016 FINDINGS: NG tube tip is in the proximal to mid stomach. Dilated small bowel loops again noted compatible with small bowel obstruction. No  real  change. No free air organomegaly. IMPRESSION: Continued small bowel obstruction pattern. Electronically Signed   By: Rolm Baptise M.D.   On: 03/08/2016 07:42    EKG:  Orders placed or performed during the hospital encounter of 03/06/16  . ED EKG  . ED EKG    ASSESSMENT AND PLAN:  Principal Problem:   Small bowel obstruction (HCC) Active Problems:   Blood glucose elevated   HLD (hyperlipidemia)   BP (high blood pressure)   AKI (acute kidney injury) (HCC)   Uncontrollable vomiting   Hypokalemia #1. Small bowel obstruction, Improved, NGT out. Surgeon plans to start diet tomorrow. #2. Essential hypertension,  continue current therapy with hydralazine intravenously as needed, initiate oral medications when taking PO. #3. Hypokalemia, Change IVF to NS with 20k and recheck k in am. #4 acute renal insufficiency, improved with IV fluid administration, follow closely #5. Thrombocytopenia, unclear etiology, seems to be decreasing despite discontinuation of Lovenox,  follow in the morning #6 . BPH with bladder instability, resume tamsulosin and Ditropan, ordered bladder scan, postvoidal, however, results are not available Management plans discussed with the patient, family and they are in agreement.   DRUG ALLERGIES: No Known Allergies  CODE STATUS:     Code Status Orders        Start     Ordered   03/06/16 1019  Full code   Continuous     03/06/16 1022    Code Status History    Date Active Date Inactive Code Status Order ID Comments User Context   This patient has a current code status but no historical code status.      TOTAL TIME TAKING CARE OF THIS PATIENT: 20 minutes.    Dana Allan.D on 03/09/2016 at 4:40 PM  Between 7am to 6pm - Pager - [phone]  After 6pm go to [email or web address] - password [email or web address] [facility]  [facility]  Office  [phone]  CC: Primary care physician; Leonel Ramsay, MD

## 2016-03-09 NOTE — Progress Notes (Signed)
CC: SBO Subjective: Patient reports no complaints overnight. He tolerated his NG tube being out without difficulty. He states he's been passing flatus and denies any nausea or vomiting.  Objective: Vital signs in last 24 hours: Temp:  [97.8 F (36.6 C)-100 F (37.8 C)] 97.8 F (36.6 C) (06/27 0437) Pulse Rate:  [56-70] 56 (06/27 0437) Resp:  [19-20] 20 (06/27 0437) BP: (132-164)/(49-57) 132/49 mmHg (06/27 0437) SpO2:  [93 %-96 %] 96 % (06/27 0437) Last BM Date: 03/12/16  Intake/Output from previous day: 13-Mar-2023 0701 - 06/27 0700 In: 1524.6 [I.V.:1494.6; NG/GT:30] Out: 550 [Urine:200; Emesis/NG output:350] Intake/Output this shift:    Physical exam:  Gen.: No acute distress Chest: Clear to auscultation Heart: Regular rhythm Abdomen: Soft, nontender, nondistended  Lab Results: CBC   Recent Labs  03/07/16 0510 03-12-16 0532 03/09/16 0352  WBC 7.2 7.0  --   HGB 15.1 15.4  --   HCT 44.6 44.6  --   PLT 98* 90* 99*   BMET  Recent Labs  03/07/16 0510 2016/03/12 0532  NA 142 142  K 3.0* 3.0*  CL 101 101  CO2 30 29  GLUCOSE 114* 107*  BUN 45* 27*  CREATININE 1.40* 1.05  CALCIUM 8.4* 8.6*   PT/INR No results for input(s): LABPROT, INR in the last 72 hours. ABG No results for input(s): PHART, HCO3 in the last 72 hours.  Invalid input(s): PCO2, PO2  Studies/Results: Dg Abd 2 Views  03/12/2016  CLINICAL DATA:  Nausea, vomiting.  Small bowel obstruction. EXAM: ABDOMEN - 2 VIEW COMPARISON:  03/07/2016 FINDINGS: NG tube tip is in the proximal to mid stomach. Dilated small bowel loops again noted compatible with small bowel obstruction. No real change. No free air organomegaly. IMPRESSION: Continued small bowel obstruction pattern. Electronically Signed   By: Rolm Baptise M.D.   On: 2016-03-12 07:42    Anti-infectives: Anti-infectives    None      Assessment/Plan:  82 year old male with what appears to be resolved or resolving small bowel structure. We will  start him on liquid diet today. Encourage ambulation and for him to go slow on a diet. Discussed with him he would need to tolerate a diet and having bowel function without difficulty prior to discharge. All questions answered to his satisfaction. If does well today possible discharge home tomorrow.  Nylah Butkus T. Adonis Huguenin, MD, FACS  03/09/2016

## 2016-03-10 ENCOUNTER — Inpatient Hospital Stay: Payer: Medicare Other

## 2016-03-10 LAB — BASIC METABOLIC PANEL
Anion gap: 11 (ref 5–15)
BUN: 20 mg/dL (ref 6–20)
CHLORIDE: 103 mmol/L (ref 101–111)
CO2: 23 mmol/L (ref 22–32)
CREATININE: 0.8 mg/dL (ref 0.61–1.24)
Calcium: 8.5 mg/dL — ABNORMAL LOW (ref 8.9–10.3)
Glucose, Bld: 142 mg/dL — ABNORMAL HIGH (ref 65–99)
Potassium: 3.1 mmol/L — ABNORMAL LOW (ref 3.5–5.1)
SODIUM: 137 mmol/L (ref 135–145)

## 2016-03-10 LAB — GLUCOSE, CAPILLARY
GLUCOSE-CAPILLARY: 110 mg/dL — AB (ref 65–99)
GLUCOSE-CAPILLARY: 153 mg/dL — AB (ref 65–99)
GLUCOSE-CAPILLARY: 156 mg/dL — AB (ref 65–99)

## 2016-03-10 LAB — MAGNESIUM: Magnesium: 2.1 mg/dL (ref 1.7–2.4)

## 2016-03-10 MED ORDER — ATORVASTATIN CALCIUM 20 MG PO TABS
20.0000 mg | ORAL_TABLET | Freq: Every day | ORAL | Status: DC
  Administered 2016-03-10: 20 mg via ORAL
  Filled 2016-03-10: qty 1

## 2016-03-10 MED ORDER — POTASSIUM CHLORIDE CRYS ER 20 MEQ PO TBCR: 40.0000 meq | EXTENDED_RELEASE_TABLET | Freq: Once | ORAL | Status: DC

## 2016-03-10 MED ORDER — POTASSIUM CHLORIDE 20 MEQ PO PACK
40.0000 meq | PACK | Freq: Two times a day (BID) | ORAL | Status: DC
  Administered 2016-03-10 (×2): 40 meq via ORAL
  Filled 2016-03-10 (×3): qty 2

## 2016-03-10 NOTE — Care Management Important Message (Signed)
Important Message  Patient Details  Name: Keith Bryant. MRN: [id number] Date of Birth: 31-May-1931   Medicare Important Message Given:  Yes    Beverly Sessions, RN 03/10/2016, 10:24 AM

## 2016-03-10 NOTE — Progress Notes (Signed)
CC: Bowel Obstruction Subjective: Patient had return of nausea and vomiting overnight for 1 episode. He has continued to pass flatus but states his abdomen is now more distended and a little more tender.  Objective: Vital signs in last 24 hours: Temp:  [98.5 F (36.9 C)-99.5 F (37.5 C)] 98.5 F (36.9 C) 2023/04/01 0435) Pulse Rate:  [62-68] 68 04-01-2023 0435) Resp:  [20] 20 04-01-2023 0435) BP: (127-133)/(40-48) 127/48 mmHg 04/01/23 0435) SpO2:  [90 %-93 %] 90 % 01-Apr-2023 0435) Last BM Date: 03/08/16  Intake/Output from previous day: 06/27 0701 - 2023-04-01 0700 In: 600 [P.O.:600] Out: 100 [Emesis/NG output:100] Intake/Output this shift: Total I/O In: 240 [P.O.:240] Out: -   Physical exam:  Gen.: No acute distress Chest: Clear to auscultation Heart: Regular rhythm Abdomen: Soft, distended and tympanic, mildly tender to deep palpation in the left lower quadrant.  Lab Results: CBC   Recent Labs  03/08/16 0532 03/09/16 0352  WBC 7.0  --   HGB 15.4  --   HCT 44.6  --   PLT 90* 99*   BMET  Recent Labs  03/08/16 0532 03-31-2016 0435  NA 142 137  K 3.0* 3.1*  CL 101 103  CO2 29 23  GLUCOSE 107* 142*  BUN 27* 20  CREATININE 1.05 0.80  CALCIUM 8.6* 8.5*   PT/INR No results for input(s): LABPROT, INR in the last 72 hours. ABG No results for input(s): PHART, HCO3 in the last 72 hours.  Invalid input(s): PCO2, PO2  Studies/Results: Dg Abd 2 Views  03-31-2016  CLINICAL DATA:  Shortness of breath. Abdominal distention. Colonic resection. Hernia repair . EXAM: ABDOMEN - 2 VIEW COMPARISON:  03/08/2016. FINDINGS: Interim removal of NG tube. Persistent severe small bowel distention consistent with small bowel obstruction. Pneumatosis cannot be excluded. CT of the abdomen pelvis should be considered to exclude pneumatosis above the small and/or large bowel. No free air. Surgical clips noted in the abdomen. Aortic atherosclerotic vascular calcification. IMPRESSION: 1. Interim removal of NG  tube. Persistent severe small bowel distention consistent with small bowel obstruction. 2. Pneumatosis cannot be excluded. CT abdomen pelvis should be considered to exclude pneumatosis of the small and/or large bowel. No free air. Critical Value/emergent results were called by telephone at the time of interpretation on 03-31-2016 at 8:59 am to nurse Lars Mage, who verbally acknowledged these results. Electronically Signed   By: Marcello Moores  Register   On: 31-Mar-2016 09:01    Anti-infectives: Anti-infectives    None      Assessment/Plan:  82 year old male with what appears to be recurrent bowel obstruction. On x-ray that was obtained earlier today difficult to ascertain whether or not this is small or small and large bowel is distended under x-ray. Patient continues to pass flatus and has had bowel movements. Encourage ambulation. Should he have recurrence of nausea and vomiting today he will need an NG tube replaced and a repeat CT scan obtained.  Stevie Charter T. Adonis Huguenin, MD, FACS  31-Mar-2016

## 2016-03-10 NOTE — Progress Notes (Signed)
Vommited approx 100cc's green colored liq on floor, Zofran given for nausea.

## 2016-03-10 NOTE — Progress Notes (Signed)
Pt refuses bed alarm, instructed for safety reasons, refuses to have on.

## 2016-03-10 NOTE — Progress Notes (Signed)
[place] [facility] [place] NAME: Keith Bryant    MR#:  [id number]  DATE OF BIRTH:  1931/08/15  SUBJECTIVE:  CHIEF COMPLAINT:   Still with nausea and abdominal pain    Review of Systems  Constitutional: Negative for fever, chills and weight loss.  HENT: Negative for congestion.   Eyes: Negative for blurred vision and double vision.  Respiratory: Negative for cough, sputum production, shortness of breath and wheezing.   Cardiovascular: Negative for chest pain, palpitations, orthopnea, leg swelling and PND.  Gastrointestinal: Positive for abdominal pain and constipation. Negative for nausea, vomiting, diarrhea and blood in stool.  Genitourinary: Negative for dysuria, urgency, frequency and hematuria.  Musculoskeletal: Negative for falls.  Neurological: Negative for dizziness, tremors, focal weakness and headaches.  Endo/Heme/Allergies: Does not bruise/bleed easily.  Psychiatric/Behavioral: Negative for depression. The patient does not have insomnia.     VITAL SIGNS: Blood pressure 127/48, pulse 68, temperature 98.5 F (36.9 C), temperature source Oral, resp. rate 20, height 6' (1.829 m), weight 111.131 kg (245 lb), SpO2 90 %.  PHYSICAL EXAMINATION:   GENERAL:  82 y.o.-year-old patient lying in the bed with no acute distress.  HEENT: Head atraumatic, normocephalic. Oropharynx and nasopharynx clear.  NECK:  Supple, no jugular venous distention. No thyroid enlargement, no tenderness.  LUNGS: Normal breath sounds bilaterally, no wheezing, rales,rhonchi or crepitation. No use of accessory muscles of respiration.  CARDIOVASCULAR: S1, S2 normal. No murmurs, rubs, or gallops.  ABDOMEN: Hypoactive bowel sounds with distention. No organomegaly or mass.  EXTREMITIES: No pedal edema, .  NEUROLOGIC: Cranial nerves II through XII are intact. PSYCHIATRIC: The patient is alert and oriented x 3.     ORDERS/RESULTS REVIEWED:   CBC  Recent Labs Lab  03/06/16 0451 03/07/16 0510 03/08/16 0532 03/09/16 0352  WBC 6.4 7.2 7.0  --   HGB 17.7 15.1 15.4  --   HCT 51.3 44.6 44.6  --   PLT 117* 98* 90* 99*  MCV 90.2 91.9 91.0  --   MCH 31.1 31.1 31.5  --   MCHC 34.4 33.9 34.6  --   RDW 14.5 14.9* 14.5  --    ------------------------------------------------------------------------------------------------------------------  Chemistries   Recent Labs Lab 03/06/16 0451 03/07/16 0510 03/08/16 0532 03/10/16 0435  NA 139 142 142 137  K 3.3* 3.0* 3.0* 3.1*  CL 97* 101 101 103  CO2 26 30 29 23   GLUCOSE 205* 114* 107* 142*  BUN 35* 45* 27* 20  CREATININE 1.70* 1.40* 1.05 0.80  CALCIUM 9.7 8.4* 8.6* 8.5*  MG  --  2.0  --  2.1  AST 27  --   --   --   ALT 18  --   --   --   ALKPHOS 35*  --   --   --   BILITOT 1.8*  --   --   --    ------------------------------------------------------------------------------------------------------------------ estimated creatinine clearance is 90.1 mL/min (by C-G formula based on Cr of 0.8). ------------------------------------------------------------------------------------------------------------------ No results for input(s): TSH, T4TOTAL, T3FREE, THYROIDAB in the last 72 hours.  Invalid input(s): FREET3  Cardiac Enzymes No results for input(s): CKMB, TROPONINI, MYOGLOBIN in the last 168 hours.  Invalid input(s): CK ------------------------------------------------------------------------------------------------------------------ Invalid input(s): POCBNP ---------------------------------------------------------------------------------------------------------------  RADIOLOGY: Dg Abd 2 Views  03/10/2016  CLINICAL DATA:  Shortness of breath. Abdominal distention. Colonic resection. Hernia repair . EXAM: ABDOMEN - 2 VIEW COMPARISON:  03/08/2016. FINDINGS: Interim removal of NG tube. Persistent severe small bowel distention consistent with  small bowel obstruction. Pneumatosis cannot be excluded. CT  of the abdomen pelvis should be considered to exclude pneumatosis above the small and/or large bowel. No free air. Surgical clips noted in the abdomen. Aortic atherosclerotic vascular calcification. IMPRESSION: 1. Interim removal of NG tube. Persistent severe small bowel distention consistent with small bowel obstruction. 2. Pneumatosis cannot be excluded. CT abdomen pelvis should be considered to exclude pneumatosis of the small and/or large bowel. No free air. Critical Value/emergent results were called by telephone at the time of interpretation on 03/10/2016 at 8:59 am to nurse Lars Mage, who verbally acknowledged these results. 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   #1. Small bowel obstruction: Patient continue have small bowel dissection. Management as per surgery. Patient may need repeat CT scan.    #2. Essential hypertension:   continue current therapy with hydralazine intravenously as needed, initiate oral medications when taking PO. #3. Hypokalemia:  Replete   #4 acute renal insufficiency: Iimproved with IV fluid administration, follow closely #5. Thrombocytopenia: unclear etiology. Lovenox discontinued and platelets at 99 today  #6 . BPH with bladder instability: resume tamsulosin and Ditropan    CODE STATUS:     Code Status Orders        Start     Ordered   03/06/16 1019  Full code   Continuous     03/06/16 1022    Code Status History    Date Active Date Inactive Code Status Order ID Comments User Context   This patient has a current code status but no historical code status.      TOTAL TIME TAKING CARE OF THIS PATIENT: 20 minutes.    Pietrina Jagodzinski M.D on 03/10/2016 at 1:33 PM  Between 7am to 6pm - Pager - [phone]  After 6pm go to [email or web address] - password [email or web address] [facility]  [facility]  Office  [phone]  CC: Primary care physician; Leonel Ramsay, MD

## 2016-03-11 LAB — BASIC METABOLIC PANEL
ANION GAP: 8 (ref 5–15)
BUN: 30 mg/dL — ABNORMAL HIGH (ref 6–20)
CALCIUM: 8.2 mg/dL — AB (ref 8.9–10.3)
CHLORIDE: 110 mmol/L (ref 101–111)
CO2: 23 mmol/L (ref 22–32)
Creatinine, Ser: 1.09 mg/dL (ref 0.61–1.24)
GFR calc Af Amer: >60 mL/min (ref >60)
GFR calc non Af Amer: >60 mL/min (ref >60)
GLUCOSE: 127 mg/dL — AB (ref 65–99)
POTASSIUM: 3.5 mmol/L (ref 3.5–5.1)
Sodium: 141 mmol/L (ref 135–145)

## 2016-03-11 LAB — GLUCOSE, CAPILLARY
GLUCOSE-CAPILLARY: 110 mg/dL — AB (ref 65–99)
Glucose-Capillary: 117 mg/dL — ABNORMAL HIGH (ref 65–99)

## 2016-03-11 LAB — MAGNESIUM: Magnesium: 2.2 mg/dL (ref 1.7–2.4)

## 2016-03-11 NOTE — Progress Notes (Signed)
Pt stable. IV removed. D/c instructions given and education provided. Pt states he understands instructions. Pt dressed and escorted out by staff. Driven home by family.

## 2016-03-11 NOTE — Progress Notes (Addendum)
Keith Bryant at [place] NAME: Keith Bryant    MR#:  [id number]  DATE OF BIRTH:  04-25-1931  SUBJECTIVE:  CHIEF COMPLAINT:    Had several bowel movements last night however now has improved. He tolerated diet well. Not complaining of abdominal pain. Passing flatulence.    Review of Systems  Constitutional: Negative for fever, chills and weight loss.  HENT: Negative for congestion.   Eyes: Negative for blurred vision and double vision.  Respiratory: Negative for cough, sputum production, shortness of breath and wheezing.   Cardiovascular: Negative for chest pain, palpitations, orthopnea, leg swelling and PND.  Gastrointestinal: Positive for diarrhea. Negative for nausea, vomiting and blood in stool.  Genitourinary: Negative for dysuria, urgency, frequency and hematuria.  Musculoskeletal: Negative for falls.  Neurological: Negative for dizziness, tremors, focal weakness and headaches.  Endo/Heme/Allergies: Does not bruise/bleed easily.  Psychiatric/Behavioral: Negative for depression. The patient does not have insomnia.     VITAL SIGNS: Blood pressure 114/55, pulse 61, temperature 98 F (36.7 C), temperature source Oral, resp. rate 17, height 6' (1.829 m), weight 111.131 kg (245 lb), SpO2 92 %.  PHYSICAL EXAMINATION:   GENERAL:  82 y.o.-year-old patient lying in the bed with no acute distress.  HEENT: Head atraumatic, normocephalic. Oropharynx and nasopharynx clear.  NECK:  Supple, no jugular venous distention. No thyroid enlargement, no tenderness.  LUNGS: Normal breath sounds bilaterally, no wheezing, rales,rhonchi or crepitation. No use of accessory muscles of respiration.  CARDIOVASCULAR: S1, S2 normal. No murmurs, rubs, or gallops.  ABDOMEN: Good bowel sounds with no distention. No rebound or tenderness No organomegaly or mass.  EXTREMITIES: No pedal edema, .  NEUROLOGIC: Cranial nerves II through XII are intact. PSYCHIATRIC:  The patient is alert and oriented x 3.     ORDERS/RESULTS REVIEWED:   CBC  Recent Labs Lab 03/06/16 0451 03/07/16 0510 03/08/16 0532 03/09/16 0352  WBC 6.4 7.2 7.0  --   HGB 17.7 15.1 15.4  --   HCT 51.3 44.6 44.6  --   PLT 117* 98* 90* 99*  MCV 90.2 91.9 91.0  --   MCH 31.1 31.1 31.5  --   MCHC 34.4 33.9 34.6  --   RDW 14.5 14.9* 14.5  --    ------------------------------------------------------------------------------------------------------------------  Chemistries   Recent Labs Lab 03/06/16 0451 03/07/16 0510 03/08/16 0532 03/10/16 0435 03/11/16 0440  NA 139 142 142 137 141  K 3.3* 3.0* 3.0* 3.1* 3.5  CL 97* 101 101 103 110  CO2 26 30 29 23 23   GLUCOSE 205* 114* 107* 142* 127*  BUN 35* 45* 27* 20 30*  CREATININE 1.70* 1.40* 1.05 0.80 1.09  CALCIUM 9.7 8.4* 8.6* 8.5* 8.2*  MG  --  2.0  --  2.1 2.2  AST 27  --   --   --   --   ALT 18  --   --   --   --   ALKPHOS 35*  --   --   --   --   BILITOT 1.8*  --   --   --   --    ------------------------------------------------------------------------------------------------------------------ estimated creatinine clearance is 66.1 mL/min (by C-G formula based on Cr of 1.09). ------------------------------------------------------------------------------------------------------------------ No results for input(s): TSH, T4TOTAL, T3FREE, THYROIDAB in the last 72 hours.  Invalid input(s): FREET3  Cardiac Enzymes No results for input(s): CKMB, TROPONINI, MYOGLOBIN in the last 168 hours.  Invalid input(s): CK ------------------------------------------------------------------------------------------------------------------ Invalid input(s): POCBNP ---------------------------------------------------------------------------------------------------------------  RADIOLOGY: Dg  Abd 2 Views  03/10/2016  CLINICAL DATA:  Shortness of breath. Abdominal distention. Colonic resection. Hernia repair . EXAM: ABDOMEN - 2 VIEW  COMPARISON:  03/08/2016. FINDINGS: Interim removal of NG tube. Persistent severe small bowel distention consistent with small bowel obstruction. Pneumatosis cannot be excluded. CT of the abdomen pelvis should be considered to exclude pneumatosis above the small and/or large bowel. No free air. Surgical clips noted in the abdomen. Aortic atherosclerotic vascular calcification. IMPRESSION: 1. Interim removal of NG tube. Persistent severe small bowel distention consistent with small bowel obstruction. 2. Pneumatosis cannot be excluded. CT abdomen pelvis should be considered to exclude pneumatosis of the small and/or large bowel. No free air. Critical Value/emergent results were called by telephone at the time of interpretation on 03/10/2016 at 8:59 am to nurse Keith Bryant, who verbally acknowledged these results. 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   #1. Small bowel obstruction: Patient doing fairly well today. Management as per surgery.  #2. Essential hypertension:   Patient on home medications.   #3. Hypokalemia:  Repleted  #4 acute renal insufficiency: Iimproved with IV fluid administration, #5. Thrombocytopenia: unclear etiology. Lovenox discontinued and platelets at 99 today  #6 . BPH with bladder instability: resume tamsulosin and Ditropan  I will sign off please call for further questions. Thank you  These discharge patient with all home medications.  CODE STATUS:     Code Status Orders        Start     Ordered   03/06/16 1019  Full code   Continuous     03/06/16 1022    Code Status History    Date Active Date Inactive Code Status Order ID Comments User Context   This patient has a current code status but no historical code status.      TOTAL TIME TAKING CARE OF THIS PATIENT: 20 minutes.     Keith Bryant M.D on 03/11/2016 at 11:39 AM  Between 7am to 6pm - Pager - [phone]  After 6pm go to [email or web address] - password [email or web address] [facility]  [facility]  Office  [phone]  CC: Primary care physician; Keith Ramsay, MD

## 2016-03-11 NOTE — Discharge Instructions (Signed)
Small Bowel Obstruction °A small bowel obstruction means that something is blocking the small bowel. The small bowel is also called the small intestine. It is the long tube that connects the stomach to the colon. An obstruction will stop food and fluids from passing through the small bowel. Treatment depends on what is causing the problem and how bad the problem is. °HOME CARE °· Get a lot of rest. °· Follow your diet as told by your doctor. You may need to: °¨ Only drink clear liquids until you start to get better. °¨ Avoid solid foods as told by your doctor. °· Take over-the-counter and prescription medicines only as told by your doctor. °· Keep all follow-up visits as told by your doctor. This is important. °GET HELP IF: °· You have a fever. °· You have chills. °GET HELP RIGHT AWAY IF: °· You have pain or cramps that get worse. °· You throw up (vomit) blood. °· You have a feeling of being sick to your stomach (nausea) that does not go away. °· You cannot stop throwing up. °· You cannot drink fluids. °· You feel confused. °· You feel dry or thirsty (dehydrated). °· Your belly gets more bloated. °· You feel weak or you pass out (faint). °  °This information is not intended to replace advice given to you by your health care provider. Make sure you discuss any questions you have with your health care provider. °  °Document Released: 10/07/2004 Document Revised: 05/21/2015 Document Reviewed: 10/24/2014 °Elsevier Interactive Patient Education ©2016 [employer] ° °

## 2016-03-11 NOTE — Final Progress Note (Signed)
CC: bowel obstruction Subjective: Patient did well overnight. Had multiple formed bowel movements and tolerated his diet. Desires to go home  Objective: Vital signs in last 24 hours: Temp:  [97.8 F (36.6 C)-99 F (37.2 C)] 98.3 F (36.8 C) (06/29 1224) Pulse Rate:  [59-76] 59 (06/29 1224) Resp:  [17-19] 18 (06/29 1224) BP: (114-156)/(52-63) 119/52 mmHg (06/29 1224) SpO2:  [90 %-93 %] 93 % (06/29 1224) Last BM Date: 07-Apr-2016  Intake/Output from previous day: April 08, 2023 0701 - 06/29 0700 In: 660 [P.O.:660] Out: -  Intake/Output this shift: Total I/O In: 240 [P.O.:240] Out: -   Physical exam:  Gen: NAD Chest: CTA Heart: RRR Abd: Soft, NT, ND  Lab Results: CBC   Recent Labs  03/09/16 0352  PLT 99*   BMET  Recent Labs  07-Apr-2016 0435 03/11/16 0440  NA 137 141  K 3.1* 3.5  CL 103 110  CO2 23 23  GLUCOSE 142* 127*  BUN 20 30*  CREATININE 0.80 1.09  CALCIUM 8.5* 8.2*   PT/INR No results for input(s): LABPROT, INR in the last 72 hours. ABG No results for input(s): PHART, HCO3 in the last 72 hours.  Invalid input(s): PCO2, PO2  Studies/Results: Dg Abd 2 Views  07-Apr-2016  CLINICAL DATA:  Shortness of breath. Abdominal distention. Colonic resection. Hernia repair . EXAM: ABDOMEN - 2 VIEW COMPARISON:  03/08/2016. FINDINGS: Interim removal of NG tube. Persistent severe small bowel distention consistent with small bowel obstruction. Pneumatosis cannot be excluded. CT of the abdomen pelvis should be considered to exclude pneumatosis above the small and/or large bowel. No free air. Surgical clips noted in the abdomen. Aortic atherosclerotic vascular calcification. IMPRESSION: 1. Interim removal of NG tube. Persistent severe small bowel distention consistent with small bowel obstruction. 2. Pneumatosis cannot be excluded. CT abdomen pelvis should be considered to exclude pneumatosis of the small and/or large bowel. No free air. Critical Value/emergent results were called by  telephone at the time of interpretation on 04-07-2016 at 8:59 am to nurse Lars Mage, who verbally acknowledged these results. Electronically Signed   By: Marcello Moores  Register   On: April 07, 2016 09:01    Anti-infectives: Anti-infectives    None      Assessment/Plan:  82 year old male with a resolved bowel obstruction. Doing well. Will discharge home. F/U with PCP. May follow up with surgery on an as needed basis  Geneviene Tesch T. Adonis Huguenin, MD, FACS  03/11/2016

## 2016-03-11 NOTE — Care Management (Signed)
Patient to discharge home today.  No RNCM needs identified.

## 2016-03-11 NOTE — Discharge Summary (Signed)
Patient ID: Keith A Ruesch Sr. MRN: [id number] DOB/AGE: [birth year] 82 y.o.  Admit date: 03/06/2016 Discharge date: 03/11/2016  Discharge Diagnoses:  Bowel obstruction  Procedures Performed: NG Tube decompression  Discharged Condition: good  Hospital Course: Admitted for bowel obstruction. Resolved with NG tube decompression. Tolerated diet with good bowel function prior to discharge  Discharge Orders: Discharge home  Disposition: 01-Home or Self Care  Discharge Medications:   Medication List    TAKE these medications        aspirin 81 MG tablet  Take 81 mg by mouth daily as needed for pain.     atorvastatin 20 MG tablet  Commonly known as:  LIPITOR  Take 20 mg by mouth at bedtime.     CENTRUM SILVER PO  Take 1 tablet by mouth daily.     hydrochlorothiazide 25 MG tablet  Commonly known as:  HYDRODIURIL  Take 12.5 mg by mouth daily.     Magnesium 400 MG Tabs  Take 400 mg by mouth daily.     oxybutynin 5 MG 24 hr tablet  Commonly known as:  DITROPAN-XL  Take 5 mg by mouth daily.     POTASSIUM PO  Take 100 mg by mouth daily.     tamsulosin 0.4 MG Caps capsule  Commonly known as:  FLOMAX  Take 0.4 mg by mouth daily.     verapamil 240 MG CR tablet  Commonly known as:  CALAN-SR  Take 240 mg by mouth 2 (two) times daily.     vitamin B-12 1000 MCG tablet  Commonly known as:  CYANOCOBALAMIN  Take 1,000 mcg by mouth daily.         Follwup: Follow-up Information    Follow up with FITZGERALD, DAVID P, MD. Schedule an appointment as soon as possible for a visit in 2 weeks.   Specialty:  Infectious Diseases   Contact information:   [place] [address] [phone]       Call [facility]   Specialty:  General Surgery   Why:  As needed   Contact information:   [place] [phone]      Signed: Clayburn Pert 03/11/2016, 12:42 PM

## 2016-03-14 ENCOUNTER — Emergency Department: Payer: Medicare Other

## 2016-03-14 ENCOUNTER — Encounter: Payer: Self-pay | Admitting: Emergency Medicine

## 2016-03-14 ENCOUNTER — Observation Stay
Admission: EM | Admit: 2016-03-14 | Discharge: 2016-03-15 | Disposition: A | Discharge status: Home / Self Care | Payer: Medicare Other | Attending: General Surgery | Admitting: General Surgery

## 2016-03-14 DIAGNOSIS — I4891 Unspecified atrial fibrillation: Secondary | ICD-10-CM | POA: Insufficient documentation

## 2016-03-14 DIAGNOSIS — I10 Essential (primary) hypertension: Secondary | ICD-10-CM | POA: Insufficient documentation

## 2016-03-14 DIAGNOSIS — K819 Cholecystitis, unspecified: Secondary | ICD-10-CM | POA: Diagnosis present

## 2016-03-14 DIAGNOSIS — Z85828 Personal history of other malignant neoplasm of skin: Secondary | ICD-10-CM | POA: Diagnosis not present

## 2016-03-14 DIAGNOSIS — E876 Hypokalemia: Secondary | ICD-10-CM | POA: Diagnosis present

## 2016-03-14 DIAGNOSIS — Z9841 Cataract extraction status, right eye: Secondary | ICD-10-CM | POA: Diagnosis not present

## 2016-03-14 DIAGNOSIS — M1A9XX0 Chronic gout, unspecified, without tophus (tophi): Secondary | ICD-10-CM | POA: Diagnosis not present

## 2016-03-14 DIAGNOSIS — K828 Other specified diseases of gallbladder: Secondary | ICD-10-CM | POA: Insufficient documentation

## 2016-03-14 DIAGNOSIS — Z87891 Personal history of nicotine dependence: Secondary | ICD-10-CM | POA: Diagnosis not present

## 2016-03-14 DIAGNOSIS — E7800 Pure hypercholesterolemia, unspecified: Secondary | ICD-10-CM | POA: Diagnosis not present

## 2016-03-14 DIAGNOSIS — R109 Unspecified abdominal pain: Secondary | ICD-10-CM | POA: Diagnosis present

## 2016-03-14 DIAGNOSIS — Z9842 Cataract extraction status, left eye: Secondary | ICD-10-CM | POA: Insufficient documentation

## 2016-03-14 DIAGNOSIS — N200 Calculus of kidney: Secondary | ICD-10-CM | POA: Insufficient documentation

## 2016-03-14 DIAGNOSIS — N179 Acute kidney failure, unspecified: Secondary | ICD-10-CM | POA: Diagnosis not present

## 2016-03-14 DIAGNOSIS — Z809 Family history of malignant neoplasm, unspecified: Secondary | ICD-10-CM | POA: Insufficient documentation

## 2016-03-14 DIAGNOSIS — Z8249 Family history of ischemic heart disease and other diseases of the circulatory system: Secondary | ICD-10-CM | POA: Diagnosis not present

## 2016-03-14 DIAGNOSIS — N281 Cyst of kidney, acquired: Secondary | ICD-10-CM | POA: Diagnosis not present

## 2016-03-14 DIAGNOSIS — R112 Nausea with vomiting, unspecified: Secondary | ICD-10-CM

## 2016-03-14 LAB — URINALYSIS COMPLETE WITH MICROSCOPIC (ARMC ONLY)
BACTERIA UA: NONE SEEN
Bilirubin Urine: NEGATIVE
Glucose, UA: NEGATIVE mg/dL
HGB URINE DIPSTICK: NEGATIVE
Leukocytes, UA: NEGATIVE
NITRITE: NEGATIVE
PH: 5 (ref 5.0–8.0)
PROTEIN: 30 mg/dL — AB
Specific Gravity, Urine: 1.025 (ref 1.005–1.030)

## 2016-03-14 LAB — COMPREHENSIVE METABOLIC PANEL
ALBUMIN: 3.3 g/dL — AB (ref 3.5–5.0)
ALK PHOS: 35 U/L — AB (ref 38–126)
ALT: 14 U/L — AB (ref 17–63)
AST: 21 U/L (ref 15–41)
Anion gap: 11 (ref 5–15)
BUN: 24 mg/dL — AB (ref 6–20)
CALCIUM: 8.8 mg/dL — AB (ref 8.9–10.3)
CO2: 20 mmol/L — AB (ref 22–32)
CREATININE: 1.23 mg/dL (ref 0.61–1.24)
Chloride: 107 mmol/L (ref 101–111)
GFR calc Af Amer: >60 mL/min (ref >60)
GFR calc non Af Amer: 52 mL/min — ABNORMAL LOW (ref >60)
GLUCOSE: 128 mg/dL — AB (ref 65–99)
Potassium: 3.2 mmol/L — ABNORMAL LOW (ref 3.5–5.1)
SODIUM: 138 mmol/L (ref 135–145)
Total Bilirubin: 1 mg/dL (ref 0.3–1.2)
Total Protein: 7.3 g/dL (ref 6.5–8.1)

## 2016-03-14 LAB — CBC
HEMATOCRIT: 46.4 % (ref 40.0–52.0)
HEMOGLOBIN: 15.7 g/dL (ref 13.0–18.0)
MCH: 30.6 pg (ref 26.0–34.0)
MCHC: 33.9 g/dL (ref 32.0–36.0)
MCV: 90.1 fL (ref 80.0–100.0)
Platelets: 148 10*3/uL — ABNORMAL LOW (ref 150–440)
RBC: 5.15 MIL/uL (ref 4.40–5.90)
RDW: 15 % — ABNORMAL HIGH (ref 11.5–14.5)
WBC: 15.6 10*3/uL — ABNORMAL HIGH (ref 3.8–10.6)

## 2016-03-14 LAB — LIPASE, BLOOD: Lipase: 61 U/L — ABNORMAL HIGH (ref 11–51)

## 2016-03-14 MED ORDER — ONDANSETRON HCL 4 MG/2ML IJ SOLN
4.0000 mg | Freq: Once | INTRAMUSCULAR | Status: AC | PRN
  Administered 2016-03-14: 4 mg via INTRAVENOUS
  Filled 2016-03-14: qty 2

## 2016-03-14 MED ORDER — SODIUM CHLORIDE 0.9 % IV BOLUS (SEPSIS)
1000.0000 mL | Freq: Once | INTRAVENOUS | Status: AC
  Administered 2016-03-14: 1000 mL via INTRAVENOUS

## 2016-03-14 MED ORDER — DIATRIZOATE MEGLUMINE & SODIUM 66-10 % PO SOLN
15.0000 mL | Freq: Once | ORAL | Status: AC
  Administered 2016-03-14: 15 mL via ORAL

## 2016-03-14 MED ORDER — ONDANSETRON 8 MG PO TBDP: 4.0000 mg | ORAL_TABLET | Freq: Four times a day (QID) | ORAL | Status: DC | PRN

## 2016-03-14 MED ORDER — ONDANSETRON HCL 4 MG/2ML IJ SOLN: 4.0000 mg | Freq: Four times a day (QID) | INTRAMUSCULAR | Status: DC | PRN

## 2016-03-14 MED ORDER — POTASSIUM CHLORIDE CRYS ER 20 MEQ PO TBCR
40.0000 meq | EXTENDED_RELEASE_TABLET | Freq: Once | ORAL | Status: AC
  Administered 2016-03-14: 40 meq via ORAL
  Filled 2016-03-14: qty 2

## 2016-03-14 MED ORDER — DEXTROSE-NACL 5-0.45 % IV SOLN
INTRAVENOUS | Status: DC
  Administered 2016-03-14: 1 mL via INTRAVENOUS
  Administered 2016-03-15: 15:00:00 via INTRAVENOUS

## 2016-03-14 MED ORDER — ONDANSETRON HCL 4 MG PO TABS: 4.0000 mg | ORAL_TABLET | Freq: Three times a day (TID) | ORAL | Status: DC | PRN

## 2016-03-14 MED ORDER — SODIUM CHLORIDE 0.9 % IV BOLUS (SEPSIS)
500.0000 mL | Freq: Once | INTRAVENOUS | Status: AC
  Administered 2016-03-14: 500 mL via INTRAVENOUS

## 2016-03-14 MED ORDER — IOPAMIDOL (ISOVUE-300) INJECTION 61%
100.0000 mL | Freq: Once | INTRAVENOUS | Status: AC | PRN
  Administered 2016-03-14: 100 mL via INTRAVENOUS

## 2016-03-14 MED ORDER — PIPERACILLIN-TAZOBACTAM 3.375 G IVPB
3.3750 g | Freq: Three times a day (TID) | INTRAVENOUS | Status: DC
  Administered 2016-03-14 – 2016-03-15 (×3): 3.375 g via INTRAVENOUS
  Filled 2016-03-14 (×6): qty 50

## 2016-03-14 NOTE — Care Management Obs Status (Signed)
[place] NOTIFICATION   Patient Details  Name: Keith A Wagoner Sr. MRN: [id number] Date of Birth: 1931/01/13   Medicare Observation Status Notification Given:  Yes    CrutchfieldFarris, Klauser, RN 03/14/2016, 10:41 PM

## 2016-03-14 NOTE — ED Provider Notes (Signed)
[facility] Emergency Department Provider Note   ____________________________________________  Time seen: I have reviewed the triage vital signs and the triage nursing note.  HISTORY  Chief Complaint Nausea   Historian Patient  HPI Keith Bryant. is a 82 y.o. male who was recently admitted to the hospitalfor nausea and vomiting, abdominal pain and small bowel obstruction, and was discharged 2 days ago. He states that he went home and had cramping potatoes and then after that he started having some nausea and vomiting and intolerance of by mouth intake again. He is not sure of his abdomen is more distended than prior. He's not had a fever. He's not had any hematemesis. He did have a relatively normal bowel movement this morning. He does not report having nausea medicine at home.  Symptoms are moderate.    Past Medical History  Diagnosis Date  . Hypertension   . Diverticulitis   . Hypercholesteremia   . Skin cancer   . Lower extremity edema     Patient Active Problem List   Diagnosis Date Noted  . Hypokalemia   . Blood glucose elevated 03/06/2016  . HLD (hyperlipidemia) 03/06/2016  . BP (high blood pressure) 03/06/2016  . Small bowel obstruction ([place]) 03/06/2016  . AKI (acute kidney injury) ([place])   . Uncontrollable vomiting   . Edema leg 05/02/2015  . Chronic gouty arthritis 11/08/2014    Past Surgical History  Procedure Laterality Date  . Colon resection    . Colonoscopy    . Hernia repair    . Cataract extraction w/phaco Right 09/18/2015    Procedure: CATARACT EXTRACTION PHACO AND INTRAOCULAR LENS PLACEMENT (IOC);  Surgeon: Leandrew Koyanagi, MD;  Location: [facility] ORS;  Service: Ophthalmology;  Laterality: Right;  US00:57.4 AP12.5 CDE7.15 FLUID LOT [id number]  . Tonsillectomy    . Cataract extraction w/phaco Left 10/16/2015    Procedure: CATARACT EXTRACTION PHACO AND INTRAOCULAR LENS PLACEMENT (IOC);  Surgeon: Leandrew Koyanagi, MD;   Location: [facility] ORS;  Service: Ophthalmology;  Laterality: Left;  [place]   1:02.9 AP%  12.5 CDE    8.81 casette lot # [id number]   exp 02/10/2017    Current Outpatient Rx  Name  Route  Sig  Dispense  Refill  . aspirin 81 MG tablet   Oral   Take 81 mg by mouth daily as needed for pain.         Marland Kitchen atorvastatin (LIPITOR) 20 MG tablet   Oral   Take 20 mg by mouth at bedtime.          . hydrochlorothiazide (HYDRODIURIL) 25 MG tablet   Oral   Take 12.5 mg by mouth daily.          . Magnesium 400 MG TABS   Oral   Take 400 mg by mouth daily.          . Multiple Vitamins-Minerals (CENTRUM SILVER PO)   Oral   Take 1 tablet by mouth daily.         Marland Kitchen oxybutynin (DITROPAN-XL) 5 MG 24 hr tablet   Oral   Take 5 mg by mouth daily.          Marland Kitchen POTASSIUM PO   Oral   Take 100 mg by mouth daily.          . tamsulosin (FLOMAX) 0.4 MG CAPS capsule   Oral   Take 0.4 mg by mouth daily.         . verapamil (CALAN-SR) 240 MG CR tablet  Oral   Take 240 mg by mouth 2 (two) times daily.         . vitamin B-12 (CYANOCOBALAMIN) 1000 MCG tablet   Oral   Take 1,000 mcg by mouth daily.         . ondansetron (ZOFRAN) 4 MG tablet   Oral   Take 1 tablet (4 mg total) by mouth every 8 (eight) hours as needed for nausea or vomiting.   10 tablet   0     Allergies Review of patient's allergies indicates no known allergies.  Family History  Problem Relation Age of Onset  . Heart disease Father   . Cancer Father   . Cancer Sister     Social History Social History  Substance Use Topics  . Smoking status: Former Research scientist (life sciences)  . Smokeless tobacco: None  . Alcohol Use: Yes     Comment: couple beers a week    Review of Systems  Constitutional: Negative for fever. Eyes: Negative for visual changes. ENT: Negative for sore throat. Cardiovascular: Negative for chest pain. Respiratory: Negative for shortness of breath. Gastrointestinal: As per hpi Genitourinary: Negative for  dysuria. Musculoskeletal: Negative for back pain. Skin: Negative for rash. Neurological: Negative for headache. 10 point Review of Systems otherwise negative ____________________________________________   PHYSICAL EXAM:  VITAL SIGNS: ED Triage Vitals  Enc Vitals Group     BP 03/14/16 1058 147/77 mmHg     Pulse Rate 03/14/16 1058 99     Resp 03/14/16 1058 18     Temp 03/14/16 1058 95.1 F (35.1 C)     Temp Source 03/14/16 1058 Oral     SpO2 03/14/16 1058 94 %     Weight 03/14/16 1058 245 lb (111.131 kg)     Height 03/14/16 1058 6' (1.829 m)     Head Cir --      Peak Flow --      Pain Score 03/14/16 1100 0     Pain Loc --      Pain Edu? --      Excl. in [place]? --      Constitutional: Alert and oriented. Well appearing and in no distress. HEENT   Head: Normocephalic and atraumatic.      Eyes: Conjunctivae are normal. PERRL. Normal extraocular movements.      Ears:         Nose: No congestion/rhinnorhea.   Mouth/Throat: Mucous membranes are dry.   Neck: No stridor. Cardiovascular/Chest: Normal rate, regular rhythm.  No murmurs, rubs, or gallops. Respiratory: Normal respiratory effort without tachypnea nor retractions. Breath sounds are clear and equal bilaterally. No wheezes/rales/rhonchi. Gastrointestinal: Soft. Mild distention, mild diffuse discomfort may be more left-sided. No guarding or rebound.  Genitourinary/rectal:Deferred Musculoskeletal: Nontender with normal range of motion in all extremities. No joint effusions.  No lower extremity tenderness.  No edema. Neurologic:  Normal speech and language. No gross or focal neurologic deficits are appreciated. Skin:  Skin is warm, dry and intact. No rash noted. Psychiatric: Mood and affect are normal. Speech and behavior are normal. Patient exhibits appropriate insight and judgment.  ____________________________________________   EKG I, Lisa Roca, MD, the attending physician have personally viewed and  interpreted all ECGs.  96 bpm. Undetermined rhythm but I suspect normal sinus rhythm with PACs, irregularly irregular. Left axis deviation. Narrow QRS. Nonspecific ST and T-wave especially inferiorly ____________________________________________  LABS (pertinent positives/negatives)  Labs Reviewed  LIPASE, BLOOD - Abnormal; Notable for the following:    Lipase 61 (*)  All other components within normal limits  COMPREHENSIVE METABOLIC PANEL - Abnormal; Notable for the following:    Potassium 3.2 (*)    CO2 20 (*)    Glucose, Bld 128 (*)    BUN 24 (*)    Calcium 8.8 (*)    Albumin 3.3 (*)    ALT 14 (*)    Alkaline Phosphatase 35 (*)    GFR calc non Af Amer 52 (*)    All other components within normal limits  CBC - Abnormal; Notable for the following:    WBC 15.6 (*)    RDW 15.0 (*)    Platelets 148 (*)    All other components within normal limits  URINALYSIS COMPLETEWITH MICROSCOPIC ([facility] ONLY) - Abnormal; Notable for the following:    Color, Urine YELLOW (*)    APPearance CLEAR (*)    Ketones, ur 1+ (*)    Protein, ur 30 (*)    Squamous Epithelial / LPF 0-5 (*)    All other components within normal limits    ____________________________________________  RADIOLOGY All Xrays were viewed by me. Imaging interpreted by Radiologist.  CT abdomen and pelvis with contrast: Pending __________________________________________  PROCEDURES  Procedure(s) performed: None  Critical Care performed: None  ____________________________________________   ED COURSE / ASSESSMENT AND PLAN  Pertinent labs & imaging results that were available during my care of the patient were reviewed by me and considered in my medical decision making (see chart for details).   This patient was recently discharged for small bowel obstruction after hospital admission and is now having recurrent symptoms. He is not febrile or hypotensive, stable vital signs. He actually states he feels a little bit  better here in the emergency department in terms of his nausea. His belly pain is moderate, he states less than it was when he was in the hospital, but his white blood cell count is not elevated. He is also hypokalemic. I am going to CT his abdomen for reevaluation.  Patient care transferred to Dr. Jacqualine Code at shift change. CT abdomen and pelvis is pending. Disposition depends on CT result. Patient states that he is interested and potentially going home with a nausea prescription if no acute need to stay in the hospital.    CONSULTATIONS:   None   Patient / Family / Caregiver informed of clinical course, medical decision-making process, and agree with plan.     ___________________________________________   FINAL CLINICAL IMPRESSION(S) / ED DIAGNOSES   Final diagnoses:  Non-intractable vomiting with nausea, vomiting of unspecified type  Hypokalemia              Note: This dictation was prepared with Dragon dictation. Any transcriptional errors that result from this process are unintentional   Lisa Roca, MD 03/14/16 1510

## 2016-03-14 NOTE — H&P (Signed)
Keith Bryant. is an 82 y.o. male.   Chief Complaint: abdomoinal pain, n/v HPI: 82yrold male with nausea and crampy abdominal pain.  Was seen by our service last week and discharged home on Friday.  Today he had some more abdominal pain and nausea and so came back into the ED.  At this time however he has had 2 large BM in the ED and feels much better and wishes to go home.  He denies any complaints, denies pain, denies nausea.   He denies any sick contacts, fever or chills.   He denies any abdominal pain, dysuria, flank pain, or hematuria.   Past Medical History  Diagnosis Date  . Hypertension   . Diverticulitis   . Hypercholesteremia   . Skin cancer   . Lower extremity edema     Past Surgical History  Procedure Laterality Date  . Colon resection    . Colonoscopy    . Hernia repair    . Cataract extraction w/phaco Right 09/18/2015    Procedure: CATARACT EXTRACTION PHACO AND INTRAOCULAR LENS PLACEMENT (IOC);  Surgeon: CLeandrew Koyanagi MD;  Location: [facility] ORS;  Service: Ophthalmology;  Laterality: Right;  US00:57.4 AP12.5 CDE7.15 FLUID LOT [id number]  . Tonsillectomy    . Cataract extraction w/phaco Left 10/16/2015    Procedure: CATARACT EXTRACTION PHACO AND INTRAOCULAR LENS PLACEMENT (IOC);  Surgeon: CLeandrew Koyanagi MD;  Location: [facility] ORS;  Service: Ophthalmology;  Laterality: Left;  [place]  1:02.9 AP%  12.5 CDE    8.81 casette lot # [id number]   exp 02/10/2017    Family History  Problem Relation Age of Onset  . Heart disease Father   . Cancer Father   . Cancer Sister    Social History:  reports that he has quit smoking. He does not have any smokeless tobacco history on file. He reports that he drinks alcohol. He reports that he does not use illicit drugs.  Allergies: No Known Allergies   (Not in a hospital admission)  Results for orders placed or performed during the hospital encounter of 03/14/16 (from the past 48 hour(s))  Lipase, blood     Status: Abnormal    Collection Time: 03/14/16 11:10 AM  Result Value Ref Range   Lipase 61 (H) 11 - 51 U/L  Comprehensive metabolic panel     Status: Abnormal   Collection Time: 03/14/16 11:10 AM  Result Value Ref Range   Sodium 138 135 - 145 mmol/L   Potassium 3.2 (L) 3.5 - 5.1 mmol/L   Chloride 107 101 - 111 mmol/L   CO2 20 (L) 22 - 32 mmol/L   Glucose, Bld 128 (H) 65 - 99 mg/dL   BUN 24 (H) 6 - 20 mg/dL   Creatinine, Ser 1.23 0.61 - 1.24 mg/dL   Calcium 8.8 (L) 8.9 - 10.3 mg/dL   Total Protein 7.3 6.5 - 8.1 g/dL   Albumin 3.3 (L) 3.5 - 5.0 g/dL   AST 21 15 - 41 U/L   ALT 14 (L) 17 - 63 U/L   Alkaline Phosphatase 35 (L) 38 - 126 U/L   Total Bilirubin 1.0 0.3 - 1.2 mg/dL   GFR calc non Af Amer 52 (L) >60 mL/min   GFR calc Af Amer >60 >60 mL/min    Comment: (NOTE) The eGFR has been calculated using the CKD EPI equation. This calculation has not been validated in all clinical situations. eGFR's persistently <60 mL/min signify possible Chronic Kidney Disease.    Anion gap 11  5 - 15  CBC     Status: Abnormal   Collection Time: 03/14/16 11:10 AM  Result Value Ref Range   WBC 15.6 (H) 3.8 - 10.6 K/uL   RBC 5.15 4.40 - 5.90 MIL/uL   Hemoglobin 15.7 13.0 - 18.0 g/dL   HCT 46.4 40.0 - 52.0 %   MCV 90.1 80.0 - 100.0 fL   MCH 30.6 26.0 - 34.0 pg   MCHC 33.9 32.0 - 36.0 g/dL   RDW 15.0 (H) 11.5 - 14.5 %   Platelets 148 (L) 150 - 440 K/uL  Urinalysis complete, with microscopic     Status: Abnormal   Collection Time: 03/14/16  1:53 PM  Result Value Ref Range   Color, Urine YELLOW (A) YELLOW   APPearance CLEAR (A) CLEAR   Glucose, UA NEGATIVE NEGATIVE mg/dL   Bilirubin Urine NEGATIVE NEGATIVE   Ketones, ur 1+ (A) NEGATIVE mg/dL   Specific Gravity, Urine 1.025 1.005 - 1.030   Hgb urine dipstick NEGATIVE NEGATIVE   pH 5.0 5.0 - 8.0   Protein, ur 30 (A) NEGATIVE mg/dL   Nitrite NEGATIVE NEGATIVE   Leukocytes, UA NEGATIVE NEGATIVE   RBC / HPF 0-5 0 - 5 RBC/hpf   WBC, UA 0-5 0 - 5 WBC/hpf    Bacteria, UA NONE SEEN NONE SEEN   Squamous Epithelial / LPF 0-5 (A) NONE SEEN   Mucous PRESENT    Hyaline Casts, UA PRESENT    Ct Abdomen Pelvis W Contrast  03/14/2016  CLINICAL DATA:  Acute onset of nausea and vomiting. Generalized abdominal pain. Initial encounter. EXAM: CT ABDOMEN AND PELVIS WITH CONTRAST TECHNIQUE: Multidetector CT imaging of the abdomen and pelvis was performed using the standard protocol following bolus administration of intravenous contrast. CONTRAST:  126m ISOVUE-300 IOPAMIDOL (ISOVUE-300) INJECTION 61% COMPARISON:  CT of the abdomen and pelvis from 03/06/2016, and abdominal radiograph performed 03/10/2016 FINDINGS: Mild left basilar scarring is noted. There is dilatation of small-bowel loops to 4.1 cm in maximal diameter, with gradual decompression along the mid ileum. No focal transition point is seen, and a small amount of intraluminal fluid extends to the level of the ileocolic anastomosis at the right lower quadrant. Findings may reflect mild small bowel dysmotility. Trace ascites is seen tracking about the liver. The liver and spleen are unremarkable in appearance. The pancreas and adrenal glands are unremarkable. Mild soft tissue inflammation is suggested about the gallbladder, which could reflect mild cholecystitis. Would correlate with the patient's symptoms. Nonspecific perinephric stranding is noted bilaterally. A large 8.2 cm cyst is noted arising at the interpole region of the left kidney, with scattered small stones noted in the left kidney, measuring up to 4 mm in size. There is no evidence of hydronephrosis. No obstructing ureteral stones are seen. The stomach is within normal limits. No acute vascular abnormalities are seen. Scattered calcification is noted along the abdominal aorta and its branches. Scattered diverticulosis is noted along the descending and sigmoid colon. The sigmoid colon is redundant. The bladder is mildly distended and grossly unremarkable. The  prostate remains normal in size, with scattered calcification. No inguinal lymphadenopathy is seen. No acute osseous abnormalities are identified. Facet disease is noted at the lower lumbar spine. IMPRESSION: 1. Mild soft inflammation about the gallbladder, raising concern for mild acute cholecystitis. Would correlate with the patient's symptoms. 2. Trace ascites noted tracking about the liver, possibly related to cholecystitis. 3. Dilatation of small-bowel loops to 4.1 cm in maximal diameter, with gradual decompression. No focal transition  point seen. Findings may reflect mild small bowel dysmotility. 4. Scattered calcification along the abdominal aorta and its branches. 5. Small left renal stones noted. Large 8.2 cm left renal cyst seen. 6. Scattered diverticulosis along the descending and sigmoid colon. 7. Mild left basilar scarring noted. Electronically Signed   By: Garald Balding M.D.   On: 03/14/2016 17:23    Review of Systems  Constitutional: Positive for malaise/fatigue. Negative for fever and chills.  HENT: Negative for congestion and sore throat.   Respiratory: Negative for cough, shortness of breath and wheezing.   Cardiovascular: Negative for chest pain, palpitations and leg swelling.  Gastrointestinal:. Negative for diarrhea, constipation, blood in stool and melena.  Genitourinary: Positive for urgency and frequency. Negative for dysuria, hematuria and flank pain.  Musculoskeletal: Negative for back pain, joint pain and neck pain.  Skin: Negative for itching and rash.  Neurological: Negative for dizziness, loss of consciousness and weakness.  Psychiatric/Behavioral: Negative for depression. The patient is not nervous/anxious.   All other systems reviewed and are negative.   Blood pressure 151/75, pulse 70, temperature 97.6 F (36.4 C), temperature source Oral, resp. rate 24, height 6' (1.829 m), weight 245 lb (111.131 kg), SpO2 93 %. Physical Exam  Vitals reviewed. Constitutional:  He is oriented to person, place, and time. He appears well-developed and well-nourished. No distress.  HENT:  Head: Normocephalic and atraumatic.  Right Ear: External ear normal.  Left Ear: External ear normal.  Nose: Nose normal.  Mouth/Throat: Oropharynx is clear and moist. No oropharyngeal exudate.  Eyes: Conjunctivae and EOM are normal. Pupils are equal, round, and reactive to light. No scleral icterus.  Neck: Normal range of motion. Neck supple. No tracheal deviation present.  Cardiovascular: Normal rate, regular rhythm Respiratory: unlabored  GI: Soft. nondistended. There is no tenderness. There is no rebound and no guarding.   Musculoskeletal: Normal range of motion. He exhibits no edema or tenderness.  Neurological: He is alert and oriented to person, place, and time. No cranial nerve deficit.  Skin: Skin is warm and dry. No rash noted. No erythema. No pallor.  Psychiatric: He has a normal mood and affect. His behavior is normal. Judgment and thought content normal.     Assessment/Plan 82 yr old male with resolving SBO, having bowel function, symptoms alleviated.  CT scan does demonstrate some GB distension and some scant free fluid around liver, which is not uncommon in the setting of fasting, given his last several days of SBO and nausea.  Nonetheless will obtain RUQ [place] to rule out cholecystitis.  WBC most likely due to dehydration from poor PO, will give IVF, no obvious signs of infection.  Assuming [place] is negative, patient and his son have expressed a strong desire to go home, which I feel is reasonable.  They were instructed to return for return of symptoms, pain, nausea/vomiting, fevers.  They can schedule an appointment to see [place] in clinic this week to check in and make sure continues to clinically improve  Mali Ein Rijo, MD 03/14/2016, 6:59 PM

## 2016-03-14 NOTE — ED Notes (Signed)
Recently admitted to hospital for emesis. States discharged 2 days ago, but continue to be nauseated and unable to eat.

## 2016-03-14 NOTE — ED Provider Notes (Signed)
Review of CT scan, mild hypothermia presentation, elevated white count, with associated nausea and a recent admission for abdominal pain. His ultrasound does demonstrates about her maladies, and in this clinical setting the potential for acute cholecystitis is strongly considered.  The patient reports that he is feeling well at present, he is in no distress, and after discussion of findings including mild hypothermia, mild pericholecystic fluid, leukocytosis and we will admit the patient for further evaluation under the surgical service and rule out cholecystitis.  US Abdomen Limited RUQ (Final result) Result time: 03/14/16 21:13:46   Final result by Rad Results In Interface (03/14/16 21:13:46)   Narrative:   CLINICAL DATA: Abdominal pain. Nausea, onset yesterday.  EXAM: US ABDOMEN LIMITED - RIGHT UPPER QUADRANT  COMPARISON: CT abdomen/ pelvis earlier this day  FINDINGS: Gallbladder:  Gallbladder filled with sludge, no discrete gallstone. Gallbladder or wall thickening and edema, wall thickness of 5 mm. Small amount of pericholecystic fluid. No sonographic Murphy sign noted by sonographer.  Common bile duct:  Diameter: 5 mm, normal.  Liver:  No focal lesion identified. Within normal limits in parenchymal echogenicity. Normal directional flow in the main portal vein.  IMPRESSION: Sludge filled gallbladder with pericholecystic fluid, wall thickening and edema. No discrete gallstones. Findings are suspicious for acute acalculous cholecystitis, however sonographic Murphy sign is negative. Nuclear medicine HIDA scan could be considered based on clinical scenario.   Electronically Signed By: Jeb Levering M.D. On: 03/14/2016 21:13           Patient did his son both agreeable with plan.  Delman Kitten, MD 03/14/16 2225

## 2016-03-15 ENCOUNTER — Observation Stay: Payer: Medicare Other

## 2016-03-15 ENCOUNTER — Encounter: Payer: Self-pay | Admitting: Radiology

## 2016-03-15 DIAGNOSIS — K819 Cholecystitis, unspecified: Secondary | ICD-10-CM | POA: Diagnosis not present

## 2016-03-15 LAB — COMPREHENSIVE METABOLIC PANEL
ALBUMIN: 2.7 g/dL — AB (ref 3.5–5.0)
ALK PHOS: 31 U/L — AB (ref 38–126)
ALT: 11 U/L — AB (ref 17–63)
ANION GAP: 8 (ref 5–15)
AST: 17 U/L (ref 15–41)
BILIRUBIN TOTAL: 0.6 mg/dL (ref 0.3–1.2)
BUN: 14 mg/dL (ref 6–20)
CALCIUM: 7.6 mg/dL — AB (ref 8.9–10.3)
CO2: 20 mmol/L — AB (ref 22–32)
CREATININE: 0.95 mg/dL (ref 0.61–1.24)
Chloride: 112 mmol/L — ABNORMAL HIGH (ref 101–111)
GFR calc Af Amer: >60 mL/min (ref >60)
GFR calc non Af Amer: >60 mL/min (ref >60)
GLUCOSE: 92 mg/dL (ref 65–99)
Potassium: 3.2 mmol/L — ABNORMAL LOW (ref 3.5–5.1)
SODIUM: 140 mmol/L (ref 135–145)
TOTAL PROTEIN: 5.8 g/dL — AB (ref 6.5–8.1)

## 2016-03-15 LAB — CBC
HCT: 39.7 % — ABNORMAL LOW (ref 40.0–52.0)
Hemoglobin: 13.6 g/dL (ref 13.0–18.0)
MCH: 30.6 pg (ref 26.0–34.0)
MCHC: 34.3 g/dL (ref 32.0–36.0)
MCV: 89.4 fL (ref 80.0–100.0)
Platelets: 117 10*3/uL — ABNORMAL LOW (ref 150–440)
RBC: 4.45 MIL/uL (ref 4.40–5.90)
RDW: 15.1 % — AB (ref 11.5–14.5)
WBC: 11.7 10*3/uL — ABNORMAL HIGH (ref 3.8–10.6)

## 2016-03-15 MED ORDER — TECHNETIUM TC 99M MEBROFENIN IV KIT
5.0000 | PACK | Freq: Once | INTRAVENOUS | Status: AC | PRN
  Administered 2016-03-15: 5.525 via INTRAVENOUS

## 2016-03-15 NOTE — Progress Notes (Signed)
Initial Nutrition Assessment  DOCUMENTATION CODES:   Obesity unspecified  INTERVENTION:  -Await diet progression    NUTRITION DIAGNOSIS:   Inadequate oral intake related to acute illness as evidenced by NPO status.  GOAL:   Patient will meet greater than or equal to 90% of their needs  MONITOR:   Diet advancement, Labs, Weight trends  REASON FOR ASSESSMENT:   Malnutrition Screening Tool    ASSESSMENT:    82 yo male admitted with abdominal pain with N/V, recent admission for SBO, GB filled with sludge  Pt down for HIDA on visit today Unable to complete Nutrition-Focused physical exam at this time.    Past Medical History  Diagnosis Date  . Hypertension   . Diverticulitis   . Hypercholesteremia   . Skin cancer   . Lower extremity edema      Diet Order:  Diet NPO time specified  Skin:  Reviewed, no issues  Last BM:  +7/3   Labs: reviewed  Meds: D5-1/2 NS at 75 ml/hr  Height:   Ht Readings from Last 1 Encounters:  03/14/16 6' (1.829 m)    Weight: Pt weight stable per wt encounters but family indicating that pt may have lost some wt recently  Wt Readings from Last 1 Encounters:  03/14/16 245 lb (111.131 kg)    Wt Readings from Last 10 Encounters:  03/14/16 245 lb (111.131 kg)  03/06/16 245 lb (111.131 kg)  10/16/15 245 lb (111.131 kg)  09/18/15 245 lb (111.131 kg)    BMI:  Body mass index is 33.22 kg/(m^2).  Estimated Nutritional Needs:   Kcal:  2100-2400 kcals   Protein:  >/= 105 g  Fluid:  >/= 2 L  EDUCATION NEEDS:   No education needs identified at this time [place], [place], [place] [phone] Pager  [phone] Weekend/On-Call Pager

## 2016-03-15 NOTE — Discharge Instructions (Signed)
Resume home medications Follow-up with primary care physician and Dr. Burt Knack in 10 days Return to the emergency room should abdominal pain occur

## 2016-03-15 NOTE — Care Management (Signed)
Placed in observation for abdominal pain with concerns for  gallbladder issues.  Patient with recent stay for small bowel obstruction with discharge on 6/30.  No discharge needs identified at present.  NPO

## 2016-03-15 NOTE — Discharge Summary (Signed)
Physician Discharge Summary  Patient ID: Keith Bryant. MRN: [id number] DOB/AGE: 1931/08/09 82 y.o.  Admit date: 03/14/2016 Discharge date: 03/15/2016   Discharge Diagnoses:  Active Problems:   Abdominal pain   Procedures:Nausea  Hospital Course: This a patient who was admitted last week with a partial small bowel obstruction which resolved spontaneously and then he came back not able to eat but having no abdominal pain and workup in the emergency room suggested acalculous cholecystitis with sludge in his gallbladder. However the patient has demonstrated and complained of absolutely no abdominal pain and no discomfort and he is now presently tolerating a diet. He wants to go home and there is been no sign of objective cholecystitis on exam or by his subjective complaints. With that in mind he is being discharged today to follow-up in our office and with his primary care physician in 10 days he is to restart all home medications no new medications given  Consults: None  Disposition: 01-Home or Self Care     Medication List    TAKE these medications        aspirin 81 MG tablet  Take 81 mg by mouth daily as needed for pain.     atorvastatin 20 MG tablet  Commonly known as:  LIPITOR  Take 20 mg by mouth at bedtime.     CENTRUM SILVER PO  Take 1 tablet by mouth daily.     hydrochlorothiazide 25 MG tablet  Commonly known as:  HYDRODIURIL  Take 12.5 mg by mouth daily.     Magnesium 400 MG Tabs  Take 400 mg by mouth daily.     ondansetron 4 MG tablet  Commonly known as:  ZOFRAN  Take 1 tablet (4 mg total) by mouth every 8 (eight) hours as needed for nausea or vomiting.     oxybutynin 5 MG 24 hr tablet  Commonly known as:  DITROPAN-XL  Take 5 mg by mouth daily.     POTASSIUM PO  Take 100 mg by mouth daily.     tamsulosin 0.4 MG Caps capsule  Commonly known as:  FLOMAX  Take 0.4 mg by mouth daily.     verapamil 240 MG CR tablet  Commonly known as:  CALAN-Bryant  Take 240  mg by mouth 2 (two) times daily.     vitamin B-12 1000 MCG tablet  Commonly known as:  CYANOCOBALAMIN  Take 1,000 mcg by mouth daily.           Follow-up Information    Follow up with Phoebe Perch, MD In 10 days.   Specialty:  Surgery   Contact information:   [address] [place] [address] [phone]       Florene Glen, MD, FACS

## 2016-03-15 NOTE — Progress Notes (Signed)
Pt stable. IV removed. D/c instructions given and education provided. Signed prescriptions verified and given. Pt states he understands instructions. Pt dressed and escorted out by staff. Driven home by family.  

## 2016-03-15 NOTE — Progress Notes (Signed)
CC: Not eating Subjective: History is reviewed with the admitting surgeon as well as with family and patient. Currently patient has no abdominal pain he was not able to eat yesterday but is hungry now he has no nausea vomiting and absolutely no abdominal pain he states she's never had a right upper quadrant pain. Denies fevers or chills  Objective: Vital signs in last 24 hours: Temp:  [97.7 F (36.5 C)-98.3 F (36.8 C)] 98.3 F (36.8 C) (07/03 0450) Pulse Rate:  [40-66] 58 (07/03 0621) Resp:  [18-23] 18 (07/03 0450) BP: (128-177)/(37-83) 156/62 mmHg (07/03 0621) SpO2:  [94 %-100 %] 96 % (07/03 0450) Last BM Date: 03/14/16  Intake/Output from previous day: 07/02 0701 - 07/03 0700 In: 585 [P.O.:60; I.V.:525] Out: 175 [Urine:175] Intake/Output this shift: Total I/O In: -  Out: 200 [Urine:200]  Physical exam:  Awake alert oriented. Vital signs reviewed in stable Abdomen is soft nondistended nontympanitic and nontender negative Murphy sign no mass  Calves are nontender   Lab Results: CBC   Recent Labs  03/14/16 1110 03/15/16 0416  WBC 15.6* 11.7*  HGB 15.7 13.6  HCT 46.4 39.7*  PLT 148* 117*   BMET  Recent Labs  03/14/16 1110 03/15/16 0416  NA 138 140  K 3.2* 3.2*  CL 107 112*  CO2 20* 20*  GLUCOSE 128* 92  BUN 24* 14  CREATININE 1.23 0.95  CALCIUM 8.8* 7.6*   PT/INR No results for input(s): LABPROT, INR in the last 72 hours. ABG No results for input(s): PHART, HCO3 in the last 72 hours.  Invalid input(s): PCO2, PO2  Studies/Results: Ct Abdomen Pelvis W Contrast  03/14/2016  CLINICAL DATA:  Acute onset of nausea and vomiting. Generalized abdominal pain. Initial encounter. EXAM: CT ABDOMEN AND PELVIS WITH CONTRAST TECHNIQUE: Multidetector CT imaging of the abdomen and pelvis was performed using the standard protocol following bolus administration of intravenous contrast. CONTRAST:  162mL ISOVUE-300 IOPAMIDOL (ISOVUE-300) INJECTION 61% COMPARISON:  CT of  the abdomen and pelvis from 03/06/2016, and abdominal radiograph performed 03/10/2016 FINDINGS: Mild left basilar scarring is noted. There is dilatation of small-bowel loops to 4.1 cm in maximal diameter, with gradual decompression along the mid ileum. No focal transition point is seen, and a small amount of intraluminal fluid extends to the level of the ileocolic anastomosis at the right lower quadrant. Findings may reflect mild small bowel dysmotility. Trace ascites is seen tracking about the liver. The liver and spleen are unremarkable in appearance. The pancreas and adrenal glands are unremarkable. Mild soft tissue inflammation is suggested about the gallbladder, which could reflect mild cholecystitis. Would correlate with the patient's symptoms. Nonspecific perinephric stranding is noted bilaterally. A large 8.2 cm cyst is noted arising at the interpole region of the left kidney, with scattered small stones noted in the left kidney, measuring up to 4 mm in size. There is no evidence of hydronephrosis. No obstructing ureteral stones are seen. The stomach is within normal limits. No acute vascular abnormalities are seen. Scattered calcification is noted along the abdominal aorta and its branches. Scattered diverticulosis is noted along the descending and sigmoid colon. The sigmoid colon is redundant. The bladder is mildly distended and grossly unremarkable. The prostate remains normal in size, with scattered calcification. No inguinal lymphadenopathy is seen. No acute osseous abnormalities are identified. Facet disease is noted at the lower lumbar spine. IMPRESSION: 1. Mild soft inflammation about the gallbladder, raising concern for mild acute cholecystitis. Would correlate with the patient's symptoms. 2. Trace ascites  noted tracking about the liver, possibly related to cholecystitis. 3. Dilatation of small-bowel loops to 4.1 cm in maximal diameter, with gradual decompression. No focal transition point seen.  Findings may reflect mild small bowel dysmotility. 4. Scattered calcification along the abdominal aorta and its branches. 5. Small left renal stones noted. Large 8.2 cm left renal cyst seen. 6. Scattered diverticulosis along the descending and sigmoid colon. 7. Mild left basilar scarring noted. Electronically Signed   By: Garald Balding M.D.   On: 03/14/2016 17:23   Nm Hepato W/eject Fract  03/15/2016  CLINICAL DATA:  Right upper quadrant pain with gallbladder sludge and wall thickening on recent ultrasound examination. EXAM: NUCLEAR MEDICINE HEPATOBILIARY IMAGING WITH GALLBLADDER EF TECHNIQUE: Sequential images of the abdomen were obtained out to 60 minutes following intravenous administration of radiopharmaceutical. After oral ingestion of Ensure, gallbladder ejection fraction was determined. At 60 min, normal ejection fraction is greater than 33%. RADIOPHARMACEUTICALS:  5.525 mCi Tc-52m  Choletec IV COMPARISON:  None. FINDINGS: Prompt uptake and biliary excretion of activity by the liver is seen. Gallbladder activity is visualized, consistent with patency of cystic duct. Biliary activity passes into small bowel, consistent with patent common bile duct. Calculated gallbladder ejection fraction is 6%%. (Normal gallbladder ejection fraction with Ensure is greater than 33%.) IMPRESSION: Normal uptake and excretion of biliary tracer. The gallbladder visualizes in a normal fashion. Significantly reduced gallbladder ejection fraction of 6% consistent with gallbladder dysfunction. Electronically Signed   By: Inez Catalina M.D.   On: 03/15/2016 14:27   US Abdomen Limited Ruq  03/14/2016  CLINICAL DATA:  Abdominal pain.  Nausea, onset yesterday. EXAM: US ABDOMEN LIMITED - RIGHT UPPER QUADRANT COMPARISON:  CT abdomen/ pelvis earlier this day FINDINGS: Gallbladder: Gallbladder filled with sludge, no discrete gallstone. Gallbladder or wall thickening and edema, wall thickness of 5 mm. Small amount of pericholecystic fluid.  No sonographic Murphy sign noted by sonographer. Common bile duct: Diameter: 5 mm, normal. Liver: No focal lesion identified. Within normal limits in parenchymal echogenicity. Normal directional flow in the main portal vein. IMPRESSION: Sludge filled gallbladder with pericholecystic fluid, wall thickening and edema. No discrete gallstones. Findings are suspicious for acute acalculous cholecystitis, however sonographic Murphy sign is negative. Nuclear medicine HIDA scan could be considered based on clinical scenario. Electronically Signed   By: Jeb Levering M.D.   On: 03/14/2016 21:13    Anti-infectives: Anti-infectives    Start     Dose/Rate Route Frequency Ordered Stop   03/14/16 2230  piperacillin-tazobactam (ZOSYN) IVPB 3.375 g     3.375 g 12.5 mL/hr over 240 Minutes Intravenous Every 8 hours 03/14/16 2222        Assessment/Plan:  Patient feels well wants to eat I will advance him to a full liquid diet should he tolerate this we will discharge him. I discussed with he and his family member the options of surgical intervention but he is completely asymptomatic at this point and removing the gallbladder for questionable sludge and acalculous cholecystitis makes no sense as the risks far outweigh the benefits. This discussed with them they understood and agreed with this plan  Florene Glen, MD, FACS  03/15/2016

## 2016-03-16 ENCOUNTER — Encounter: Payer: Self-pay | Admitting: Infectious Diseases

## 2016-03-29 ENCOUNTER — Ambulatory Visit (INDEPENDENT_AMBULATORY_CARE_PROVIDER_SITE_OTHER): Payer: Medicare Other | Admitting: General Surgery

## 2016-03-29 ENCOUNTER — Encounter: Payer: Self-pay | Admitting: General Surgery

## 2016-03-29 VITALS — BP 155/71 | HR 80 | Temp 98.8°F | Ht 72.0 in | Wt 223.0 lb

## 2016-03-29 DIAGNOSIS — Z09 Encounter for follow-up examination after completed treatment for conditions other than malignant neoplasm: Secondary | ICD-10-CM

## 2016-03-29 NOTE — Progress Notes (Signed)
Outpatient Surgical Follow Up  03/29/2016  Thom A Sircy Sr. is an 82 y.o. male.   Chief Complaint  Patient presents with  . Follow-up    Abdominal Pain (Hospitalized 7/2-7/3)    HPI: A 82 year old male returns to clinic for follow-up from his recent hospital stay. He has had numerous hospitalizations over the last 2 months for evaluation of abdominal pain, nausea, vomiting. Patient states that he has not had any pain for the last several weeks and he has not had any nausea or vomiting since his last visit to the hospital. He states that he is slowly returning back to his normal self. He is eating well and having normal bowel function for him. He denies any fevers, chills, nausea, vomiting, chest pain, shortness of breath, diarrhea, constipation.  Past Medical History  Diagnosis Date  . Hypertension   . Diverticulitis   . Hypercholesteremia   . Skin cancer   . Lower extremity edema     Past Surgical History  Procedure Laterality Date  . Colon resection    . Colonoscopy    . Hernia repair    . Cataract extraction w/phaco Right 09/18/2015    Procedure: CATARACT EXTRACTION PHACO AND INTRAOCULAR LENS PLACEMENT (IOC);  Surgeon: Leandrew Koyanagi, MD;  Location: [facility] ORS;  Service: Ophthalmology;  Laterality: Right;  US00:57.4 AP12.5 CDE7.15 FLUID LOT [id number]  . Tonsillectomy    . Cataract extraction w/phaco Left 10/16/2015    Procedure: CATARACT EXTRACTION PHACO AND INTRAOCULAR LENS PLACEMENT (IOC);  Surgeon: Leandrew Koyanagi, MD;  Location: [facility] ORS;  Service: Ophthalmology;  Laterality: Left;  [place]   1:02.9 AP%  12.5 CDE    8.81 casette lot # [id number]   exp 02/10/2017    Family History  Problem Relation Age of Onset  . Heart disease Father   . Cancer Father   . Cancer Sister     Social History:  reports that he quit smoking about 44 years ago. He has never used smokeless tobacco. He reports that he does not drink alcohol or use illicit drugs.  Allergies: No Known  Allergies  Medications reviewed.    ROS A multipoint review of systems was completed. All pertinent positives and negatives are documented within the history of present illness and remainder are negative.   BP 155/71 mmHg  Pulse 80  Temp(Src) 98.8 F (37.1 C) (Oral)  Ht 6' (1.829 m)  Wt 101.152 kg (223 lb)  BMI 30.24 kg/m2  Physical Exam Gen.: No acute distress Chest: Clear to auscultation Heart: Regular rate and rhythm Abdomen: Soft, nontender, nondistended    No results found for this or any previous visit (from the past 48 hour(s)). No results found.  Assessment/Plan:  1. Hospital discharge follow-up 82 year old male here for follow-up from recent hospitalization. During those hospitalizations he was worked up for possible cholecystitis and for possible small bowel traction. Although he's had numerous studies that show possible gallbladder pathology he is currently completely asymptomatic. Discussed with the patient and his present caregiver that there is no current indication for an elective procedure in this patient. The signs and symptoms of either biliary colic or recurrent bowel obstruction were described in detail and the patient was instructed to seek follow-up care should they occur. Otherwise he'll follow up on an as-needed basis.     Clayburn Pert, MD FACS General Surgeon  03/29/2016,2:17 PM

## 2016-03-29 NOTE — Patient Instructions (Signed)
Please call with any questions or concerns.  You may advance your diet to a regular diet.  Continue your walking as much as possible and increase water to a goal of 32 ounces daily. This will help with your bowel movements.   Small Bowel Obstruction A small bowel obstruction means that something is blocking the small bowel. The small bowel is also called the small intestine. It is the long tube that connects the stomach to the colon. An obstruction will stop food and fluids from passing through the small bowel. Treatment depends on what is causing the problem and how bad the problem is. HOME CARE  Get a lot of rest.  Follow your diet as told by your doctor. You may need to:  Only drink clear liquids until you start to get better.  Avoid solid foods as told by your doctor.  Take over-the-counter and prescription medicines only as told by your doctor.  Keep all follow-up visits as told by your doctor. This is important. GET HELP IF:  You have a fever.  You have chills. GET HELP RIGHT AWAY IF:  You have pain or cramps that get worse.  You throw up (vomit) blood.  You have a feeling of being sick to your stomach (nausea) that does not go away.  You cannot stop throwing up.  You cannot drink fluids.  You feel confused.  You feel dry or thirsty (dehydrated).  Your belly gets more bloated.  You feel weak or you pass out (faint).   This information is not intended to replace advice given to you by your health care provider. Make sure you discuss any questions you have with your health care provider.   Document Released: 10/07/2004 Document Revised: 05/21/2015 Document Reviewed: 10/24/2014 Elsevier Interactive Patient Education 2016 [place].  Biliary Colic Biliary colic is a pain in the upper abdomen. The pain:  Is usually felt on the right side of the abdomen, but it may also be felt in the center of the abdomen, just below the breastbone (sternum).  May spread  back toward the right shoulder blade.  May be steady or irregular.  May be accompanied by nausea and vomiting. Most of the time, the pain goes away in 1-5 hours. After the most intense pain passes, the abdomen may continue to ache mildly for about 24 hours. Biliary colic is caused by a blockage in the bile duct. The bile duct is a pathway that carries bile--a liquid that helps to digest fats--from the gallbladder to the small intestine. Biliary colic usually occurs after eating, when the digestive system demands bile. The pain develops when muscle cells contract forcefully to try to move the blockage so that bile can get by. HOME CARE INSTRUCTIONS  Take medicines only as directed by your health care provider.  Drink enough fluid to keep your urine clear or pale yellow.  Avoid fatty, greasy, and fried foods. These kinds of foods increase your body's demand for bile.  Avoid any foods that make your pain worse.  Avoid overeating.  Avoid having a large meal after fasting. SEEK MEDICAL CARE IF:  You develop a fever.  Your pain gets worse.  You vomit.  You develop nausea that prevents you from eating and drinking. SEEK IMMEDIATE MEDICAL CARE IF:  You suddenly develop a fever and shaking chills.  You develop a yellowish discoloration (jaundice) of:  Skin.  Whites of the eyes.  Mucous membranes.  You have continuous or severe pain that is not relieved  with medicines.  You have nausea and vomiting that is not relieved with medicines.  You develop dizziness or you faint.   This information is not intended to replace advice given to you by your health care provider. Make sure you discuss any questions you have with your health care provider.   Document Released: 01/31/2006 Document Revised: 01/14/2015 Document Reviewed: 06/11/2014 Elsevier Interactive Patient Education [employer].

## 2018-06-28 ENCOUNTER — Emergency Department: Payer: Medicare Other

## 2018-06-28 ENCOUNTER — Other Ambulatory Visit: Payer: Self-pay

## 2018-06-28 ENCOUNTER — Inpatient Hospital Stay
Admission: EM | Admit: 2018-06-28 | Discharge: 2018-07-03 | DRG: 388 | Disposition: A | Discharge status: Home / Self Care | Payer: Medicare Other | Attending: Surgery | Admitting: Surgery

## 2018-06-28 ENCOUNTER — Encounter: Payer: Self-pay | Admitting: Emergency Medicine

## 2018-06-28 DIAGNOSIS — R0902 Hypoxemia: Secondary | ICD-10-CM | POA: Diagnosis present

## 2018-06-28 DIAGNOSIS — Z79899 Other long term (current) drug therapy: Secondary | ICD-10-CM | POA: Diagnosis not present

## 2018-06-28 DIAGNOSIS — Z7982 Long term (current) use of aspirin: Secondary | ICD-10-CM | POA: Diagnosis not present

## 2018-06-28 DIAGNOSIS — R001 Bradycardia, unspecified: Secondary | ICD-10-CM | POA: Diagnosis not present

## 2018-06-28 DIAGNOSIS — J690 Pneumonitis due to inhalation of food and vomit: Secondary | ICD-10-CM | POA: Diagnosis present

## 2018-06-28 DIAGNOSIS — R0600 Dyspnea, unspecified: Secondary | ICD-10-CM

## 2018-06-28 DIAGNOSIS — K567 Ileus, unspecified: Secondary | ICD-10-CM

## 2018-06-28 DIAGNOSIS — K920 Hematemesis: Secondary | ICD-10-CM | POA: Diagnosis present

## 2018-06-28 DIAGNOSIS — I10 Essential (primary) hypertension: Secondary | ICD-10-CM | POA: Diagnosis present

## 2018-06-28 DIAGNOSIS — N400 Enlarged prostate without lower urinary tract symptoms: Secondary | ICD-10-CM | POA: Diagnosis present

## 2018-06-28 DIAGNOSIS — E876 Hypokalemia: Secondary | ICD-10-CM | POA: Diagnosis not present

## 2018-06-28 DIAGNOSIS — Z87891 Personal history of nicotine dependence: Secondary | ICD-10-CM

## 2018-06-28 DIAGNOSIS — N179 Acute kidney failure, unspecified: Secondary | ICD-10-CM | POA: Diagnosis present

## 2018-06-28 DIAGNOSIS — E785 Hyperlipidemia, unspecified: Secondary | ICD-10-CM | POA: Diagnosis present

## 2018-06-28 DIAGNOSIS — Z9049 Acquired absence of other specified parts of digestive tract: Secondary | ICD-10-CM | POA: Diagnosis not present

## 2018-06-28 DIAGNOSIS — I119 Hypertensive heart disease without heart failure: Secondary | ICD-10-CM | POA: Diagnosis present

## 2018-06-28 DIAGNOSIS — Z8719 Personal history of other diseases of the digestive system: Secondary | ICD-10-CM | POA: Diagnosis not present

## 2018-06-28 DIAGNOSIS — K56609 Unspecified intestinal obstruction, unspecified as to partial versus complete obstruction: Secondary | ICD-10-CM | POA: Diagnosis present

## 2018-06-28 DIAGNOSIS — K5652 Intestinal adhesions [bands] with complete obstruction: Secondary | ICD-10-CM | POA: Diagnosis present

## 2018-06-28 DIAGNOSIS — I509 Heart failure, unspecified: Secondary | ICD-10-CM

## 2018-06-28 DIAGNOSIS — Z23 Encounter for immunization: Secondary | ICD-10-CM

## 2018-06-28 LAB — COMPREHENSIVE METABOLIC PANEL
ALT: 16 U/L (ref 0–44)
ANION GAP: 15 (ref 5–15)
AST: 25 U/L (ref 15–41)
Albumin: 4.2 g/dL (ref 3.5–5.0)
Alkaline Phosphatase: 36 U/L — ABNORMAL LOW (ref 38–126)
BILIRUBIN TOTAL: 1.3 mg/dL — AB (ref 0.3–1.2)
BUN: 43 mg/dL — ABNORMAL HIGH (ref 8–23)
CHLORIDE: 102 mmol/L (ref 98–111)
CO2: 26 mmol/L (ref 22–32)
Calcium: 9.4 mg/dL (ref 8.9–10.3)
Creatinine, Ser: 1.33 mg/dL — ABNORMAL HIGH (ref 0.61–1.24)
GFR, EST AFRICAN AMERICAN: 55 mL/min — AB (ref >60)
GFR, EST NON AFRICAN AMERICAN: 47 mL/min — AB (ref >60)
Glucose, Bld: 195 mg/dL — ABNORMAL HIGH (ref 70–99)
POTASSIUM: 3.8 mmol/L (ref 3.5–5.1)
Sodium: 143 mmol/L (ref 135–145)
TOTAL PROTEIN: 7.6 g/dL (ref 6.5–8.1)

## 2018-06-28 LAB — CBC
HEMATOCRIT: 50.1 % (ref 39.0–52.0)
HEMOGLOBIN: 16.2 g/dL (ref 13.0–17.0)
MCH: 29.7 pg (ref 26.0–34.0)
MCHC: 32.3 g/dL (ref 30.0–36.0)
MCV: 91.8 fL (ref 80.0–100.0)
NRBC: 0 % (ref 0.0–0.2)
Platelets: 135 10*3/uL — ABNORMAL LOW (ref 150–400)
RBC: 5.46 MIL/uL (ref 4.22–5.81)
RDW: 14.4 % (ref 11.5–15.5)
WBC: 8.4 10*3/uL (ref 4.0–10.5)

## 2018-06-28 MED ORDER — HEPARIN SODIUM (PORCINE) 5000 UNIT/ML IJ SOLN
5000.0000 [IU] | Freq: Three times a day (TID) | INTRAMUSCULAR | Status: DC
  Administered 2018-06-28 – 2018-07-03 (×13): 5000 [IU] via SUBCUTANEOUS
  Filled 2018-06-28 (×13): qty 1

## 2018-06-28 MED ORDER — MORPHINE SULFATE (PF) 4 MG/ML IV SOLN: 4.0000 mg | Freq: Once | INTRAVENOUS | Status: DC

## 2018-06-28 MED ORDER — IOHEXOL 300 MG/ML  SOLN
75.0000 mL | Freq: Once | INTRAMUSCULAR | Status: AC | PRN
  Administered 2018-06-28: 75 mL via INTRAVENOUS

## 2018-06-28 MED ORDER — DEXTROSE IN LACTATED RINGERS 5 % IV SOLN
INTRAVENOUS | Status: DC
  Administered 2018-06-29: 01:00:00 via INTRAVENOUS

## 2018-06-28 MED ORDER — ONDANSETRON HCL 4 MG/2ML IJ SOLN
4.0000 mg | Freq: Once | INTRAMUSCULAR | Status: AC
  Administered 2018-06-28: 4 mg via INTRAVENOUS
  Filled 2018-06-28: qty 2

## 2018-06-28 MED ORDER — CLINDAMYCIN PHOSPHATE 300 MG/50ML IV SOLN
300.0000 mg | Freq: Once | INTRAVENOUS | Status: AC
  Administered 2018-06-28: 300 mg via INTRAVENOUS
  Filled 2018-06-28: qty 50

## 2018-06-28 NOTE — ED Triage Notes (Signed)
Pt arrived via POV from [facility] with reports of several days of vomiting with dark brown emesis today.  Family states that the pt had been in the hospital previously for bowel obstruction a few years ago.  Pt states he is not comfortable when laying down due to abdominal pain.  Pt reports he has also had diarrhea that he describes as a black and yellow color.

## 2018-06-28 NOTE — Progress Notes (Signed)
Patient discussed with ED physician, Dr. Jacqualine Code, after patient presented tonight for abdominal pain, distention, and nausea with multiple episodes of non-bloody emesis. CT personally reviewed, demonstrates small bowel obstruction, likely secondary to post-surgical adhesions. WBC WNL. Chart reviewed, including patient previously admitted to surgical service 2 years ago for same. Agree with placement of NG tube for decompression and hospitalist consultation for evaluation/antibiotics for possible aspiration pneumonia and medical management of comorbidities.  Will admit to surgical service. Full H&P to follow.  Please call if any questions or concerns.  -- Marilynne Drivers Rosana Hoes, MD, [place]: [facility] - Partnering for exceptional care. Office: [phone]

## 2018-06-28 NOTE — ED Provider Notes (Signed)
[facility] Provider Note   ____________________________________________   First MD Initiated Contact with Patient 06/28/18 2045     (approximate)  I have reviewed the triage vital signs and the nursing notes.   HISTORY  Chief Complaint Emesis and GI Bleeding (Vomiting blood-dark )    HPI Keith A Blossom Sr. is a 84 y.o. male presents for evaluation of vomiting  Patient reports that yesterday he started feeling nauseated with vomiting,  abdominal discomfort and distention.  Reports similar symptoms when a bowel obstruction in the past.  Started about 2 to 3 days ago, went to urgent care and was discharged from there but his symptoms have continued to worsen.  Had a bowel movement yesterday.  Has had multiple rounds of very dark vomit.  No fevers or chills.  Denies shortness of breath or chest pain.  Reports previous colon surgery  Past Medical History:  Diagnosis Date  . Diverticulitis   . Hypercholesteremia   . Hypertension   . Lower extremity edema   . Skin cancer     Patient Active Problem List   Diagnosis Date Noted  . Intestinal adhesions with complete obstruction ([place]) 06/28/2018  . Acalculous cholecystitis   . Abdominal pain 03/14/2016  . Hypokalemia   . Blood glucose elevated 03/06/2016  . HLD (hyperlipidemia) 03/06/2016  . BP (high blood pressure) 03/06/2016  . Small bowel obstruction ([name]) 03/06/2016  . AKI (acute kidney injury) ([place])   . Uncontrollable vomiting   . Edema leg 05/02/2015  . Chronic gouty arthritis 11/08/2014    Past Surgical History:  Procedure Laterality Date  . CATARACT EXTRACTION W/PHACO Right 09/18/2015   Procedure: CATARACT EXTRACTION PHACO AND INTRAOCULAR LENS PLACEMENT (IOC);  Surgeon: Leandrew Koyanagi, MD;  Location: [facility] ORS;  Service: Ophthalmology;  Laterality: Right;  US00:57.4 AP12.5 CDE7.15 FLUID LOT [id number]  . CATARACT EXTRACTION W/PHACO Left 10/16/2015   Procedure: CATARACT  EXTRACTION PHACO AND INTRAOCULAR LENS PLACEMENT (IOC);  Surgeon: Leandrew Koyanagi, MD;  Location: [facility] ORS;  Service: Ophthalmology;  Laterality: Left;  [place]   1:02.9 AP%  12.5 CDE    8.81 casette lot # [id number]   exp 02/10/2017  . COLON RESECTION    . COLONOSCOPY    . HERNIA REPAIR    . TONSILLECTOMY      Prior to Admission medications   Medication Sig Start Date End Date Taking? Authorizing Provider  aspirin 81 MG tablet Take 81 mg by mouth daily as needed for pain.   Yes [provider]  atorvastatin (LIPITOR) 20 MG tablet Take 20 mg by mouth at bedtime.    Yes [provider]  diltiazem (CARDIZEM CD) 240 MG 24 hr capsule TAKE 1 CAPSULE ONCE DAILY 11/16/17  Yes [provider]  hydrochlorothiazide (HYDRODIURIL) 25 MG tablet Take 12.5 mg by mouth daily.    Yes [provider]  Magnesium 400 MG TABS Take 400 mg by mouth daily.    Yes [provider]  MAGNESIUM CARBONATE PO Take by mouth.   Yes [provider]  Multiple Vitamins-Minerals (CENTRUM SILVER PO) Take 1 tablet by mouth daily.   Yes [provider]  omeprazole (PRILOSEC) 20 MG capsule TAKE 1 CAPSULE ONCE DAILY 10/31/17  Yes [provider]  ondansetron (ZOFRAN-ODT) 4 MG disintegrating tablet Take by mouth. 06/26/18  Yes [provider]  oxybutynin (DITROPAN-XL) 5 MG 24 hr tablet Take 5 mg by mouth daily.    Yes [provider]  POTASSIUM PO Take  100 mg by mouth daily.    Yes [provider]  Quinine Sulfate POWD Take 1 Dose by mouth as needed.   Yes [provider]  tadalafil (CIALIS) 20 MG tablet Take by mouth. 02/01/18 02/01/19 Yes [provider]  tamsulosin (FLOMAX) 0.4 MG CAPS capsule Take 0.4 mg by mouth daily.   Yes [provider]  verapamil (CALAN-SR) 240 MG CR tablet Take 240 mg by mouth 2 (two) times daily.   Yes [provider]  vitamin B-12 (CYANOCOBALAMIN) 1000 MCG tablet Take 1,000 mcg  by mouth daily.   Yes [provider]    Allergies Patient has no known allergies.  Family History  Problem Relation Age of Onset  . Heart disease Father   . Cancer Father   . Cancer Sister     Social History Social History   Tobacco Use  . Smoking status: Former Smoker    Last attempt to quit: 03/29/1972    Years since quitting: 46.2  . Smokeless tobacco: Never Used  Substance Use Topics  . 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 Drug use: No    Review of Systems Constitutional: No fever/chills Eyes: No visual changes. ENT: No sore throat. Cardiovascular: Denies chest pain. Respiratory: Denies shortness of breath. Gastrointestinal: Some moderate discomfort in the abdomen does not wish for any pain medicine at present .  Lots of dark vomit, did have a bowel movement yesterday which was small Genitourinary: Negative for dysuria. Musculoskeletal: Negative for back pain. Skin: Negative for rash. Neurological: Negative for headaches, areas of focal weakness or numbness.    ____________________________________________   PHYSICAL EXAM:  VITAL SIGNS: ED Triage Vitals  Enc Vitals Group     BP 06/28/18 1702 (!) 130/53     Pulse Rate 06/28/18 1702 70     Resp 06/28/18 1702 18     Temp 06/28/18 1702 98.4 F (36.9 C)     Temp Source 06/28/18 1702 Oral     SpO2 06/28/18 1702 91 %     Weight 06/28/18 1703 221 lb (100.2 kg)     Height 06/28/18 1703 6' (1.829 m)     Head Circumference --      Peak Flow --      Pain Score 06/28/18 1702 0     Pain Loc --      Pain Edu? --      Excl. in [place]? --     Constitutional: Alert and oriented. Well appearing and in no acute distress. Eyes: Conjunctivae are normal. Head: Atraumatic. Nose: No congestion/rhinnorhea. Mouth/Throat: Mucous membranes are moist. Neck: No stridor.  Cardiovascular: Normal rate, regular rhythm. Grossly normal heart sounds.  Good peripheral  circulation. Respiratory: Normal respiratory effort.  No retractions. Lungs CTAB. Gastrointestinal: Soft and notably distended, slightly tympanitic with moderate tenderness throughout. No distention. Musculoskeletal: No lower extremity tenderness nor edema. Neurologic:  Normal speech and language. No gross focal neurologic deficits are appreciated.  Skin:  Skin is warm, dry and intact. No rash noted. Psychiatric: Mood and affect are normal. Speech and behavior are normal.  ____________________________________________   LABS (all labs ordered are listed, but only abnormal results are displayed)  Labs Reviewed  COMPREHENSIVE METABOLIC PANEL - Abnormal; Notable for the following components:      Result Value   Glucose, Bld 195 (*)    BUN 43 (*)    Creatinine, Ser 1.33 (*)    Alkaline Phosphatase 36 (*)  Total Bilirubin 1.3 (*)    GFR calc non Af Amer 47 (*)    GFR calc Af Amer 55 (*)    All other components within normal limits  CBC - Abnormal; Notable for the following components:   Platelets 135 (*)    All other components within normal limits  CULTURE, BLOOD (ROUTINE X 2)  CULTURE, BLOOD (ROUTINE X 2)  LIPASE, BLOOD  CBC  BASIC METABOLIC PANEL  TYPE AND SCREEN   ____________________________________________  EKG  Reviewed interpreted by me 2100 Heart rate 99 cures 100 QTc 460 Suspect wandering atrial pacemaker with frequent PACs.  Probable left ventricular hypertrophy, no evidence of acute ischemic changes denoted. ____________________________________________  RADIOLOGY  Ct Abdomen Pelvis W Contrast  Result Date: 06/28/2018 CLINICAL DATA:  Nausea and vomiting for several days EXAM: CT ABDOMEN AND PELVIS WITH CONTRAST TECHNIQUE: Multidetector CT imaging of the abdomen and pelvis was performed using the standard protocol following bolus administration of intravenous contrast. CONTRAST:  18mL OMNIPAQUE IOHEXOL 300 MG/ML  SOLN COMPARISON:  03/14/2016 FINDINGS: Lower  chest: Mild patchy density in the left more than right lower lungs favors atelectasis. Borderline heart size with minimal pericardial calcification along the left ventricle. Fluid distended lower esophagus. Hepatobiliary: Coarse calcification in the capsular/subcapsular high right liver.No evidence of biliary obstruction or stone. Pancreas: Unremarkable. Spleen: Unremarkable. Adrenals/Urinary Tract: 15 mm right adrenal myelolipoma. No hydronephrosis or stone. Large (9.5 cm) left renal cyst. There are 2 adjacent left lower pole calculi that are likely from related urinary mass effect. Even if mural calcifications, the cyst has an overall benign appearance. Unremarkable bladder. Stomach/Bowel: Prior right hemicolectomy. There is dilated and fluid-filled small bowel with transition in the left upper quadrant where there is no evident mass or inflammation, suspected adhesion. Extensive diverticulosis. Vascular/Lymphatic: Atherosclerosis with notable advanced narrowing at the origins of the SMA and celiac. No acute vascular finding. No mass or adenopathy. Reproductive:Negative Other: No ascites or pneumoperitoneum. Musculoskeletal: Degenerative changes without acute finding. There is L4-5 grade 1 anterolisthesis related to facet arthropathy. Multilevel thoracic ankylosis from bridging osteophytes. IMPRESSION: 1. High-grade small bowel obstruction with transition in the left upper quadrant, presumably from adhesions. 2. Right hemicolectomy. 3. Colonic diverticulosis. Electronically Signed   By: Monte Fantasia M.D.   On: 06/28/2018 21:41   Dg Chest Port 1 View  Result Date: 06/28/2018 CLINICAL DATA:  Emesis EXAM: PORTABLE CHEST 1 VIEW COMPARISON:  03/06/2016 FINDINGS: Cardiomegaly with aortic atherosclerosis. Fluid level in the left upper quadrant. Mild left basilar atelectasis. No pneumothorax. IMPRESSION: Cardiomegaly with mild left basilar atelectasis. Electronically Signed   By: Donavan Foil M.D.   On:  06/28/2018 22:05   Dg Abd Portable 2 Views  Result Date: 06/28/2018 CLINICAL DATA:  Emesis EXAM: PORTABLE ABDOMEN - 2 VIEW COMPARISON:  CT 06/28/2018 FINDINGS: Minimal atelectasis at the left base. Lucency at the left diaphragm. Postsurgical changes in the right abdomen. Dilated loops of small bowel in the left abdomen measuring up to 5.8 cm. Some colon gas is present. There is contrast within the bladder. There are phleboliths in the pelvis. IMPRESSION: 1. Dilated loops of small bowel in the left hemiabdomen, consistent with small bowel obstruction 2. Small amount of residual contrast within the renal collecting systems and bladder 3. Crescent-shaped lucency at the left diaphragm, suspected to be due to atelectasis and aerated lung. Electronically Signed   By: Donavan Foil M.D.   On: 06/28/2018 22:03    ____________________________________________   PROCEDURES  Procedure(s) performed: None  Procedures  Critical Care performed: No  ____________________________________________   INITIAL IMPRESSION / ASSESSMENT AND PLAN / ED COURSE  Pertinent labs & imaging results that were available during my care of the patient were reviewed by me and considered in my medical decision making (see chart for details).   Differential diagnosis includes but is not limited to, abdominal perforation, aortic dissection, cholecystitis, appendicitis, diverticulitis, colitis, esophagitis/gastritis, kidney stone, pyelonephritis, urinary tract infection, aortic aneurysm. All are considered in decision and treatment plan. Based upon the patient's presentation and risk factors, highly suspect bowel obstruction we will proceed with CT imaging.  Do not believe patient could tolerate oral contrast.  Patient would like antiemetic but does not wish for any pain medication at present.  Hemodynamics are stable though his oxygen saturation marginal, and given the severe vomiting will obtain a chest x-ray to further evaluate.   Lung sounds are clear, but appears high risk for aspiration given his recent multiple rounds of vomiting   Patient returns for evaluation of abdominal pain nausea vomiting noted to have distended abdomen, tympanic in nature with no bowel sounds appreciable.     ----------------------------------------- 10:38 PM on 06/28/2018 -----------------------------------------  CT scan results discussed with Dr. Tama High of general surgery will admit patient.  He recommends placement of an NG tube, discussed with patient the patient has had a previous NG tube is agreeable with placement.  Additionally, suspect the patient may be aspirating somewhat with gastric contents and have placed consult to internal medicine with Dr. Jannifer Franklin to help in comanagement of this patient under the general surgery service.  We will give IV clindamycin here, patient and wife do report that he has had quite a bit of vomiting over the last 2 days.  Patient agreeable with plan for admission ____________________________________________   FINAL CLINICAL IMPRESSION(S) / ED DIAGNOSES  Final diagnoses:  Small bowel obstruction ([place])  Aspiration pneumonia of both lower lobes, unspecified aspiration pneumonia type [facility])        Note:  This document was prepared using Dragon voice recognition software and may include unintentional dictation errors       Delman Kitten, MD 06/29/18 0022

## 2018-06-28 NOTE — ED Notes (Signed)
Pt to ct at this time.

## 2018-06-28 NOTE — Consult Note (Signed)
[place] [place] NAME: Keith Bryant    MR#:  [id number]  DATE OF BIRTH:  07/26/1931  DATE OF ADMISSION:  06/28/2018  PRIMARY CARE PHYSICIAN: Katheren Shams   REQUESTING/REFERRING PHYSICIAN: Rosana Hoes, MD  CHIEF COMPLAINT:   Chief Complaint  Patient presents with  . Emesis  . GI Bleeding    Vomiting blood-dark     HISTORY OF PRESENT ILLNESS:  Keith Bryant  is a 84 y.o. male who presents with significant episodes of vomiting.  Here he is found on imaging to have small bowel obstruction.  He was admitted to surgery service with NG tube.  Hospitalist were called for consultation given some hypoxia in the ED and likely story of aspiration.  PAST MEDICAL HISTORY:   Past Medical History:  Diagnosis Date  . Diverticulitis   . Hypercholesteremia   . Hypertension   . Lower extremity edema   . Skin cancer     PAST SURGICAL HISTOIRY:   Past Surgical History:  Procedure Laterality Date  . CATARACT EXTRACTION W/PHACO Right 09/18/2015   Procedure: CATARACT EXTRACTION PHACO AND INTRAOCULAR LENS PLACEMENT (IOC);  Surgeon: Leandrew Koyanagi, MD;  Location: [facility] ORS;  Service: Ophthalmology;  Laterality: Right;  US00:57.4 AP12.5 CDE7.15 FLUID LOT [id number]  . CATARACT EXTRACTION W/PHACO Left 10/16/2015   Procedure: CATARACT EXTRACTION PHACO AND INTRAOCULAR LENS PLACEMENT (IOC);  Surgeon: Leandrew Koyanagi, MD;  Location: [facility] ORS;  Service: Ophthalmology;  Laterality: Left;  [place]   1:02.9 AP%  12.5 CDE    8.81 casette lot # [id number]   exp 02/10/2017  . COLON RESECTION    . COLONOSCOPY    . HERNIA REPAIR    . TONSILLECTOMY      SOCIAL HISTORY:   Social History   Tobacco Use  . Smoking status: Former Smoker    Last attempt to quit: 03/29/1972    Years since quitting: 46.2  . Smokeless tobacco: Never Used  Substance Use Topics  . 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 Heart disease Father   . Cancer Father   . Cancer Sister     DRUG ALLERGIES:  No Known Allergies  REVIEW OF SYSTEMS:  Review of Systems  Constitutional: Negative for chills, fever, malaise/fatigue and weight loss.  HENT: Negative for ear pain, hearing loss and tinnitus.   Eyes: Negative for blurred vision, double vision, pain and redness.  Respiratory: Positive for shortness of breath. Negative for cough and hemoptysis.   Cardiovascular: Negative for chest pain, palpitations, orthopnea and leg swelling.  Gastrointestinal: Positive for abdominal pain, nausea and vomiting. Negative for constipation and diarrhea.  Genitourinary: Negative for dysuria, frequency and hematuria.  Musculoskeletal: Negative for back pain, joint pain and neck pain.  Skin:       No acne, rash, or lesions  Neurological: Negative for dizziness, tremors, focal weakness and weakness.  Endo/Heme/Allergies: Negative for polydipsia. Does not bruise/bleed easily.  Psychiatric/Behavioral: Negative for depression. The patient is not nervous/anxious and does not have insomnia.     MEDICATIONS AT HOME:   Prior to Admission medications   Medication Sig Start Date End Date Taking? Authorizing Provider  aspirin 81 MG tablet Take 81 mg by mouth daily as needed for pain.   Yes [provider]  atorvastatin (LIPITOR) 20 MG tablet Take 20 mg by mouth at bedtime.  Yes [provider]  diltiazem (CARDIZEM CD) 240 MG 24 hr capsule TAKE 1 CAPSULE ONCE DAILY 11/16/17  Yes [provider]  hydrochlorothiazide (HYDRODIURIL) 25 MG tablet Take 12.5 mg by mouth daily.    Yes [provider]  Magnesium 400 MG TABS Take 400 mg by mouth daily.    Yes [provider]  MAGNESIUM CARBONATE PO Take by mouth.   Yes [provider]  Multiple Vitamins-Minerals (CENTRUM SILVER PO) Take 1 tablet by mouth daily.   Yes [provider]   omeprazole (PRILOSEC) 20 MG capsule TAKE 1 CAPSULE ONCE DAILY 10/31/17  Yes [provider]  ondansetron (ZOFRAN-ODT) 4 MG disintegrating tablet Take by mouth. 06/26/18  Yes [provider]  oxybutynin (DITROPAN-XL) 5 MG 24 hr tablet Take 5 mg by mouth daily.    Yes [provider]  POTASSIUM PO Take 100 mg by mouth daily.    Yes [provider]  Quinine Sulfate POWD Take 1 Dose by mouth as needed.   Yes [provider]  tadalafil (CIALIS) 20 MG tablet Take by mouth. 02/01/18 02/01/19 Yes [provider]  tamsulosin (FLOMAX) 0.4 MG CAPS capsule Take 0.4 mg by mouth daily.   Yes [provider]  verapamil (CALAN-SR) 240 MG CR tablet Take 240 mg by mouth 2 (two) times daily.   Yes [provider]  vitamin B-12 (CYANOCOBALAMIN) 1000 MCG tablet Take 1,000 mcg by mouth daily.   Yes [provider]      VITAL SIGNS:   Vitals:   06/28/18 2215 06/28/18 2230 06/28/18 2245 06/28/18 2300  BP:      Pulse: 85 81 91 77  Resp: (!) 21 (!) 29 19 (!) 24  Temp:      TempSrc:      SpO2: 94% 92% (!) 88% (!) 88%  Weight:      Height:       Wt Readings from Last 3 Encounters:  06/28/18 100.2 kg  03/29/16 101.2 kg  03/14/16 111.1 kg    PHYSICAL EXAMINATION:  Physical Exam  Vitals reviewed. Constitutional: He is oriented to person, place, and time. He appears well-developed and well-nourished. No distress.  HENT:  Head: Normocephalic and atraumatic.  Mouth/Throat: Oropharynx is clear and moist.  Eyes: Pupils are equal, round, and reactive to light. Conjunctivae and EOM are normal. No scleral icterus.  Neck: Normal range of motion. Neck supple. No JVD present. No thyromegaly present.  Cardiovascular: Normal rate, regular rhythm and intact distal pulses. Exam reveals no gallop and no friction rub.  No murmur heard. Respiratory: Effort normal. No respiratory distress. He has no wheezes. He has rales.  GI: Soft. He exhibits  no distension. There is tenderness.  Hypoactive bowel sounds  Musculoskeletal: Normal range of motion. He exhibits no edema.  No arthritis, no gout  Lymphadenopathy:    He has no cervical adenopathy.  Neurological: He is alert and oriented to person, place, and time. No cranial nerve deficit.  No dysarthria, no aphasia  Skin: Skin is warm and dry. No rash noted. No erythema.  Psychiatric: He has a normal mood and affect. His behavior is normal. Judgment and thought content normal.     LABORATORY PANEL:   CBC Recent Labs  Lab 06/28/18 1711  WBC 8.4  HGB 16.2  HCT 50.1  PLT 135*   ------------------------------------------------------------------------------------------------------------------  Chemistries  Recent Labs  Lab 06/28/18 1711  NA 143  K 3.8  CL 102  CO2 26  GLUCOSE  195*  BUN 43*  CREATININE 1.33*  CALCIUM 9.4  AST 25  ALT 16  ALKPHOS 36*  BILITOT 1.3*   ------------------------------------------------------------------------------------------------------------------  Cardiac Enzymes No results for input(s): TROPONINI in the last 168 hours. ------------------------------------------------------------------------------------------------------------------  RADIOLOGY:  Ct Abdomen Pelvis W Contrast  Result Date: 06/28/2018 CLINICAL DATA:  Nausea and vomiting for several days EXAM: CT ABDOMEN AND PELVIS WITH CONTRAST TECHNIQUE: Multidetector CT imaging of the abdomen and pelvis was performed using the standard protocol following bolus administration of intravenous contrast. CONTRAST:  40mL OMNIPAQUE IOHEXOL 300 MG/ML  SOLN COMPARISON:  03/14/2016 FINDINGS: Lower chest: Mild patchy density in the left more than right lower lungs favors atelectasis. Borderline heart size with minimal pericardial calcification along the left ventricle. Fluid distended lower esophagus. Hepatobiliary: Coarse calcification in the capsular/subcapsular high right liver.No evidence of  biliary obstruction or stone. Pancreas: Unremarkable. Spleen: Unremarkable. Adrenals/Urinary Tract: 15 mm right adrenal myelolipoma. No hydronephrosis or stone. Large (9.5 cm) left renal cyst. There are 2 adjacent left lower pole calculi that are likely from related urinary mass effect. Even if mural calcifications, the cyst has an overall benign appearance. Unremarkable bladder. Stomach/Bowel: Prior right hemicolectomy. There is dilated and fluid-filled small bowel with transition in the left upper quadrant where there is no evident mass or inflammation, suspected adhesion. Extensive diverticulosis. Vascular/Lymphatic: Atherosclerosis with notable advanced narrowing at the origins of the SMA and celiac. No acute vascular finding. No mass or adenopathy. Reproductive:Negative Other: No ascites or pneumoperitoneum. Musculoskeletal: Degenerative changes without acute finding. There is L4-5 grade 1 anterolisthesis related to facet arthropathy. Multilevel thoracic ankylosis from bridging osteophytes. IMPRESSION: 1. High-grade small bowel obstruction with transition in the left upper quadrant, presumably from adhesions. 2. Right hemicolectomy. 3. Colonic diverticulosis. Electronically Signed   By: Monte Fantasia M.D.   On: 06/28/2018 21:41   Dg Chest Port 1 View  Result Date: 06/28/2018 CLINICAL DATA:  Emesis EXAM: PORTABLE CHEST 1 VIEW COMPARISON:  03/06/2016 FINDINGS: Cardiomegaly with aortic atherosclerosis. Fluid level in the left upper quadrant. Mild left basilar atelectasis. No pneumothorax. IMPRESSION: Cardiomegaly with mild left basilar atelectasis. Electronically Signed   By: Donavan Foil M.D.   On: 06/28/2018 22:05   Dg Abd Portable 2 Views  Result Date: 06/28/2018 CLINICAL DATA:  Emesis EXAM: PORTABLE ABDOMEN - 2 VIEW COMPARISON:  CT 06/28/2018 FINDINGS: Minimal atelectasis at the left base. Lucency at the left diaphragm. Postsurgical changes in the right abdomen. Dilated loops of small bowel in the  left abdomen measuring up to 5.8 cm. Some colon gas is present. There is contrast within the bladder. There are phleboliths in the pelvis. IMPRESSION: 1. Dilated loops of small bowel in the left hemiabdomen, consistent with small bowel obstruction 2. Small amount of residual contrast within the renal collecting systems and bladder 3. Crescent-shaped lucency at the left diaphragm, suspected to be due to atelectasis and aerated lung. Electronically Signed   By: Donavan Foil M.D.   On: 06/28/2018 22:03    EKG:   Orders placed or performed during the hospital encounter of 06/28/18  . ED EKG  . ED EKG  . EKG 12-Lead  . EKG 12-Lead    IMPRESSION AND PLAN:  Principal Problem:   Intestinal adhesions with complete obstruction ([facility]) -NG tube to suction, defer surgical team for further treatment of this problem Active Problems:   Aspiration pneumonia ([place]) -IV antibiotics initiated, supportive treatment PRN   BP (high blood pressure) -continue home meds   HLD (hyperlipidemia) -Home dose antilipid  All the records are reviewed and case discussed with ED provider. Management plans discussed with the patient and/or family.  CODE STATUS:     Code Status Orders  (From admission, onward)         Start     Ordered   06/28/18 2239  Full code  Continuous     06/28/18 2242        Code Status History    Date Active Date Inactive Code Status Order ID Comments User Context   03/14/2016 2223 03/15/2016 2224 Full Code [id number]  Gonczy, [place], MD ED   03/06/2016 1022 03/11/2016 1751 Full Code [id number]  Hubbard Robinson, MD ED    Full Code  TOTAL TIME TAKING CARE OF THIS PATIENT: 40 minutes.    Keith Bryant [place] 06/28/2018, 11:41 PM  [employer]  [phone]  CC: Primary care Physician: Katheren Shams  Note:  This document was prepared using Dragon voice recognition software and may include unintentional dictation errors.

## 2018-06-29 ENCOUNTER — Inpatient Hospital Stay: Payer: Medicare Other

## 2018-06-29 DIAGNOSIS — J690 Pneumonitis due to inhalation of food and vomit: Secondary | ICD-10-CM | POA: Diagnosis present

## 2018-06-29 DIAGNOSIS — K5652 Intestinal adhesions [bands] with complete obstruction: Principal | ICD-10-CM

## 2018-06-29 LAB — BASIC METABOLIC PANEL
Anion gap: 15 (ref 5–15)
BUN: 53 mg/dL — AB (ref 8–23)
CHLORIDE: 97 mmol/L — AB (ref 98–111)
CO2: 31 mmol/L (ref 22–32)
CREATININE: 1.64 mg/dL — AB (ref 0.61–1.24)
Calcium: 9.2 mg/dL (ref 8.9–10.3)
GFR calc Af Amer: 42 mL/min — ABNORMAL LOW (ref >60)
GFR calc non Af Amer: 37 mL/min — ABNORMAL LOW (ref >60)
Glucose, Bld: 215 mg/dL — ABNORMAL HIGH (ref 70–99)
POTASSIUM: 3.4 mmol/L — AB (ref 3.5–5.1)
SODIUM: 143 mmol/L (ref 135–145)

## 2018-06-29 LAB — CBC
HEMATOCRIT: 49.5 % (ref 39.0–52.0)
HEMOGLOBIN: 16.1 g/dL (ref 13.0–17.0)
MCH: 29.7 pg (ref 26.0–34.0)
MCHC: 32.5 g/dL (ref 30.0–36.0)
MCV: 91.3 fL (ref 80.0–100.0)
Platelets: 133 10*3/uL — ABNORMAL LOW (ref 150–400)
RBC: 5.42 MIL/uL (ref 4.22–5.81)
RDW: 14.4 % (ref 11.5–15.5)
WBC: 10.7 10*3/uL — ABNORMAL HIGH (ref 4.0–10.5)
nRBC: 0 % (ref 0.0–0.2)

## 2018-06-29 LAB — LIPASE, BLOOD: Lipase: 24 U/L (ref 11–51)

## 2018-06-29 MED ORDER — ATORVASTATIN CALCIUM 20 MG PO TABS
20.0000 mg | ORAL_TABLET | Freq: Every day | ORAL | Status: DC
  Administered 2018-07-01 – 2018-07-02 (×2): 20 mg via ORAL
  Filled 2018-06-29 (×3): qty 1

## 2018-06-29 MED ORDER — HYDROCHLOROTHIAZIDE 25 MG PO TABS: 12.5000 mg | ORAL_TABLET | Freq: Every day | ORAL | Status: DC

## 2018-06-29 MED ORDER — SODIUM CHLORIDE 0.9 % IV SOLN
INTRAVENOUS | Status: DC
  Administered 2018-06-29: 09:00:00 via INTRAVENOUS

## 2018-06-29 MED ORDER — SODIUM CHLORIDE 0.9 % IV SOLN
INTRAVENOUS | Status: AC
  Administered 2018-06-29 – 2018-06-30 (×2): via INTRAVENOUS

## 2018-06-29 MED ORDER — INFLUENZA VAC SPLIT HIGH-DOSE 0.5 ML IM SUSY
0.5000 mL | PREFILLED_SYRINGE | INTRAMUSCULAR | Status: AC
  Administered 2018-07-01: 0.5 mL via INTRAMUSCULAR
  Filled 2018-06-29 (×2): qty 0.5

## 2018-06-29 MED ORDER — SODIUM CHLORIDE 0.9 % IV SOLN
3.0000 g | Freq: Four times a day (QID) | INTRAVENOUS | Status: DC
  Administered 2018-06-29 – 2018-07-02 (×13): 3 g via INTRAVENOUS
  Filled 2018-06-29 (×17): qty 3

## 2018-06-29 MED ORDER — VERAPAMIL HCL ER 240 MG PO TBCR
240.0000 mg | EXTENDED_RELEASE_TABLET | Freq: Two times a day (BID) | ORAL | Status: DC
  Administered 2018-06-30 – 2018-07-01 (×3): 240 mg via ORAL
  Filled 2018-06-29 (×8): qty 1

## 2018-06-29 NOTE — Progress Notes (Signed)
Pharmacy Antibiotic Note  Keith Bryant. is a 84 y.o. male admitted on 06/28/2018 with aspiration pneumonia.  Pharmacy has been consulted for Unasyn dosing.  Plan: Unasyn 3 grams q 6 hours ordered  Height: 6' (182.9 cm) Weight: 221 lb (100.2 kg) IBW/kg (Calculated) : 77.6  Temp (24hrs), Avg:98.7 F (37.1 C), Min:98.4 F (36.9 C), Max:99 F (37.2 C)  Recent Labs  Lab 06/28/18 1711 06/29/18 0115  WBC 8.4 10.7*  CREATININE 1.33* 1.64*    Estimated Creatinine Clearance: 40.3 mL/min (A) (by C-G formula based on SCr of 1.64 mg/dL (H)).    No Known Allergies  Antimicrobials this admission: Unasyn 10/17  >>    >>   Dose adjustments this admission:   Microbiology results: 10/17 BCx: pending   Thank you for allowing pharmacy to be a part of this patient's care.  Boe Deans S 06/29/2018 2:06 AM

## 2018-06-29 NOTE — Plan of Care (Signed)
The patient has been stable. The patient was up in the chair for 4hrs. NG tube output recorded at 1800 is was 276ml. The patient has not complained off pain.  Problem: Education: Goal: Knowledge of General Education information will improve Description Including pain rating scale, medication(s)/side effects and non-pharmacologic comfort measures Outcome: Progressing   Problem: Health Behavior/Discharge Planning: Goal: Ability to manage health-related needs will improve Outcome: Progressing   Problem: Clinical Measurements: Goal: Ability to maintain clinical measurements within normal limits will improve Outcome: Progressing Goal: Will remain free from infection Outcome: Progressing Goal: Diagnostic test results will improve Outcome: Progressing Goal: Respiratory complications will improve Outcome: Progressing Goal: Cardiovascular complication will be avoided Outcome: Progressing   Problem: Activity: Goal: Risk for activity intolerance will decrease Outcome: Progressing   Problem: Nutrition: Goal: Adequate nutrition will be maintained Outcome: Progressing   Problem: Coping: Goal: Level of anxiety will decrease Outcome: Progressing   Problem: Elimination: Goal: Will not experience complications related to bowel motility Outcome: Progressing Goal: Will not experience complications related to urinary retention Outcome: Progressing   Problem: Pain Managment: Goal: General experience of comfort will improve Outcome: Progressing   Problem: Safety: Goal: Ability to remain free from injury will improve Outcome: Progressing   Problem: Skin Integrity: Goal: Risk for impaired skin integrity will decrease Outcome: Progressing

## 2018-06-29 NOTE — H&P (Addendum)
SURGICAL HISTORY & PHYSICAL (cpt [phone])  Patient seen and examined as described below with surgical PA-C, Ardell Isaacs.  Assessment/Plan: (ICD-10's: K9.52) 84 y.o. male with recurrent complete small bowel obstruction, likely attributable to post-surgical adhesions following Right colectomy, complicated by possible aspiration pneumonia, AKI superimposed on CKD, mild leukocytosis, and by pertinent comorbidities including advanced chronological age, HTN, HLD, and history of diverticulitis.  - NPO for now, IV fluids             - NG tube for nasogastric decompression             - monitor ongoing bowel function and abdominal exam              - anticipate symptomatic relief within 24 - 48 hours following NGT insertion, followed by "rumbling" the following day and flatus either the same day or the day following the "rumbling" with anticipated length of stay ~3 - 5 days with successful non-operative management for 8 of 10 patients with small bowel obstruction attributed to post-surgical adhesions             - medical management of possible aspiration pneumonia and comorbidities as per medical team  - surgical intervention if doesn't improve was also discussed             - ambulation encouraged              - DVT prophylaxis  I have personally reviewed the patient's chart, evaluated/examined the patient, proposed the recommended management, and discussed these recommendations with the patient to his expressed satisfaction as well as with patient's RN.  Thank you for the opportunity to participate in this patient's care.  -- Marilynne Drivers Rosana Hoes, MD, [place]: [facility] - Partnering for exceptional care. Office: [phone]      SURGICAL HISTORY & PHYSICAL (cpt [phone])  HISTORY OF PRESENT ILLNESS (HPI):  84 y.o. male presented to [facility] ED on 06/28/18 for abdominal pain. Patient reports a 2-3 days history of worsening abdominal  distension accompanied by worsening diffuse abdominal pain and 1 day of nausea and emesis. He reports a history of similar in the past in which he was diagnosed with a small bowel obstruction. He reports his last BM was on 10/14. No complaints of fever, chills, CP, SOB. Abdominal surgical history is significant for previous colon resection. In the ED his work up was significant for AKI and revealed a SBO with transition point in the LUQ most likely due to adhesions.   General surgery was consulted by emergency medicine physician Dr. Delman Kitten, MD for evaluation and management of small bowel obstruction.    PAST MEDICAL HISTORY (PMH):  Past Medical History:  Diagnosis Date  . Diverticulitis   . Hypercholesteremia   . Hypertension   . Lower extremity edema   . Skin cancer     Reviewed. Otherwise negative.   PAST SURGICAL HISTORY ([place]):  Past Surgical History:  Procedure Laterality Date  . CATARACT EXTRACTION W/PHACO Right 09/18/2015   Procedure: CATARACT EXTRACTION PHACO AND INTRAOCULAR LENS PLACEMENT (IOC);  Surgeon: Leandrew Koyanagi, MD;  Location: [facility] ORS;  Service: Ophthalmology;  Laterality: Right;  US00:57.4 AP12.5 CDE7.15 FLUID LOT [id number]  . CATARACT EXTRACTION W/PHACO Left 10/16/2015   Procedure: CATARACT EXTRACTION PHACO AND INTRAOCULAR LENS PLACEMENT (IOC);  Surgeon: Leandrew Koyanagi, MD;  Location: [facility] ORS;  Service: Ophthalmology;  Laterality: Left;  [place]   1:02.9 AP%  12.5 CDE    8.81 casette lot #  [id number]   exp 02/10/2017  . COLON RESECTION    . COLONOSCOPY    . HERNIA REPAIR    . TONSILLECTOMY      Reviewed. Otherwise negative.   MEDICATIONS:  Prior to Admission medications   Medication Sig Start Date End Date Taking? Authorizing Provider  aspirin 81 MG tablet Take 81 mg by mouth daily as needed for pain.   Yes [provider]  atorvastatin (LIPITOR) 20 MG tablet Take 20 mg by mouth at bedtime.    Yes [provider]  diltiazem (CARDIZEM  CD) 240 MG 24 hr capsule TAKE 1 CAPSULE ONCE DAILY 11/16/17  Yes [provider]  hydrochlorothiazide (HYDRODIURIL) 25 MG tablet Take 12.5 mg by mouth daily.    Yes [provider]  Magnesium 400 MG TABS Take 400 mg by mouth daily.    Yes [provider]  MAGNESIUM CARBONATE PO Take by mouth.   Yes [provider]  Multiple Vitamins-Minerals (CENTRUM SILVER PO) Take 1 tablet by mouth daily.   Yes [provider]  omeprazole (PRILOSEC) 20 MG capsule TAKE 1 CAPSULE ONCE DAILY 10/31/17  Yes [provider]  ondansetron (ZOFRAN-ODT) 4 MG disintegrating tablet Take by mouth. 06/26/18  Yes [provider]  oxybutynin (DITROPAN-XL) 5 MG 24 hr tablet Take 5 mg by mouth daily.    Yes [provider]  POTASSIUM PO Take 100 mg by mouth daily.    Yes [provider]  Quinine Sulfate POWD Take 1 Dose by mouth as needed.   Yes [provider]  tadalafil (CIALIS) 20 MG tablet Take by mouth. 02/01/18 02/01/19 Yes [provider]  tamsulosin (FLOMAX) 0.4 MG CAPS capsule Take 0.4 mg by mouth daily.   Yes [provider]  verapamil (CALAN-SR) 240 MG CR tablet Take 240 mg by mouth 2 (two) times daily.   Yes [provider]  vitamin B-12 (CYANOCOBALAMIN) 1000 MCG tablet Take 1,000 mcg by mouth daily.   Yes [provider]     ALLERGIES:  No Known Allergies   SOCIAL HISTORY:  Social History   Socioeconomic History  . Marital status: Married    Spouse name: Not on file  . Number of children: Not on file  . Years of education: Not on file  . Highest education level: Not on file  Occupational History  . Not on file  Social Needs  . Financial resource strain: Not on file  . Food insecurity:    Worry: Not on file    Inability: Not on file  . Transportation needs:    Medical: Not on file    Non-medical: Not on file  Tobacco Use  . Smoking status: Former Smoker    Last attempt to quit:  03/29/1972    Years since quitting: 46.2  . Smokeless tobacco: Never Used  Substance and Sexual Activity  . 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 Drug use: No  . Sexual activity: Not on file  Lifestyle  . Physical activity:    Days per week: Not on file    Minutes per session: Not on file  . Stress: Not on file  Relationships  . Social connections:    Talks on phone: Not on file    Gets together: Not on file    Attends religious service: Not on file    Active member of club or organization: Not on file    Attends meetings of clubs  or organizations: Not on file    Relationship status: Not on file  . Intimate partner violence:    Fear of current or ex partner: Not on file    Emotionally abused: Not on file    Physically abused: Not on file    Forced sexual activity: Not on file  Other Topics Concern  . Not on file  Social History Narrative  . Not on file    The patient currently resides (home / rehab facility / nursing home): Home The patient normally is (ambulatory / bedbound): Ambulatory  FAMILY HISTORY:  Family History  Problem Relation Age of Onset  . Heart disease Father   . Cancer Father   . Cancer Sister     Otherwise negative.   REVIEW OF SYSTEMS:  Constitutional: denies any other weight loss, fever, chills, or sweats  Eyes: denies any other vision changes, history of eye injury  ENT: denies sore throat, hearing problems  Respiratory: denies shortness of breath, wheezing  Cardiovascular: denies chest pain, palpitations  Gastrointestinal: + Abdominal pain, + Distension, + Nausea, + Vomiting Genitourinary: denies burning with urination or urinary frequency Musculoskeletal: denies any other joint pains or cramps  Skin: Denies any other rashes or skin discolorations  Neurological: denies any other headache, dizziness, weakness  Psychiatric: denies any other depression, anxiety   All other review of systems were otherwise  negative.  VITAL SIGNS:  Temp:  [97.6 F (36.4 C)-99 F (37.2 C)] 97.6 F (36.4 C) (10/17 0543) Pulse Rate:  [67-94] 67 (10/17 0543) Resp:  [18-29] 20 (10/17 0543) BP: (96-134)/(46-106) 123/63 (10/17 0543) SpO2:  [86 %-96 %] 95 % (10/17 0543) Weight:  [100.2 kg] 100.2 kg (10/16 1703)     Height: 6' (182.9 cm) Weight: 100.2 kg BMI (Calculated): 29.97   INTAKE/OUTPUT:  This shift: No intake/output data recorded.  Last 2 shifts: @IOLAST2SHIFTS @  PHYSICAL EXAM:  Constitutional:  -- Normal body habitus  -- Awake, alert, and oriented x3, no apparent distress Eyes:  -- Pupils equally round and reactive to light  -- No scleral icterus, B/L no occular discharge Ear, nose, throat: -- Neck is FROM WNL -- NGT in place Pulmonary:  -- No wheezes or rhales -- Equal breath sounds bilaterally -- Breathing non-labored at rest Cardiovascular:  -- S1, S2 present  -- No pericardial rubs  Gastrointestinal:  -- Abdomen soft, nontender, distended, no guarding or rebound tenderness -- No abdominal masses appreciated, pulsatile or otherwise  Musculoskeletal and Integumentary:  -- Wounds or skin discoloration: None appreciated -- Extremities: B/L UE and LE FROM, hands and feet warm, no edema  Neurologic:  -- Motor function: Intact and symmetric -- Sensation: Intact and symmetric Psychiatric:  -- Mood and affect WNL  Labs:  CBC Latest Ref Rng & Units 06/29/2018 06/28/2018 03/15/2016  WBC 4.0 - 10.5 K/uL 10.7(H) 8.4 11.7(H)  Hemoglobin 13.0 - 17.0 g/dL 16.1 16.2 13.6  Hematocrit 39.0 - 52.0 % 49.5 50.1 39.7(L)  Platelets 150 - 400 K/uL 133(L) 135(L) 117(L)   CMP Latest Ref Rng & Units 06/29/2018 06/28/2018 03/15/2016  Glucose 70 - 99 mg/dL 215(H) 195(H) 92  BUN 8 - 23 mg/dL 53(H) 43(H) 14  Creatinine 0.61 - 1.24 mg/dL 1.64(H) 1.33(H) 0.95  Sodium 135 - 145 mmol/L 143 143 140  Potassium 3.5 - 5.1 mmol/L 3.4(L) 3.8 3.2(L)  Chloride 98 - 111 mmol/L 97(L) 102 112(H)  CO2 22 - 32 mmol/L 31 26  20(L)  Calcium 8.9 - 10.3 mg/dL 9.2 9.4 7.6(L)  Total Protein 6.5 - 8.1 g/dL - 7.6 5.8(L)  Total Bilirubin 0.3 - 1.2 mg/dL - 1.3(H) 0.6  Alkaline Phos 38 - 126 U/L - 36(L) 31(L)  AST 15 - 41 U/L - 25 17  ALT 0 - 44 U/L - 16 11(L)   Imaging studies:   -- KUB on 06/28/18:  IMPRESSION: 1. Dilated loops of small bowel in the left hemiabdomen, consistent with small bowel obstruction 2. Small amount of residual contrast within the renal collecting systems and bladder 3. Crescent-shaped lucency at the left diaphragm, suspected to be due to atelectasis and aerated lung.  -- CT Abdomen/Pelvis on 06/28/18:   IMPRESSION: 1. High-grade small bowel obstruction with transition in the left upper quadrant, presumably from adhesions. 2. Right hemicolectomy. 3. Colonic diverticulosis.  Assessment/Plan: (ICD-10's: K45.52) 84 y.o. male with complete small bowel obstruction, likely attributable to post-surgical adhesions following colon resection with AKI and mild leukocytosis, complicated by pertinent comorbidities including HTN, HLD, history of diverticulitis, and advanced age.  - NPO for now, IV fluids             - insert NG tube for nasogastric decompression             - monitor ongoing bowel function and abdominal exam   - Pain control (minimize narcotics)  - Trend WBC (CBC) and renal function (BMP) in AM             - anticipate symptomatic relief within 24 - 48 hours following NGT insertion, followed by "rumbling" the following day and flatus either the same day or the day following the "rumbling" with anticipated length of stay ~3 - 5 days with successful non-operative management for 8 of 10 patients with small bowel obstruction attributed to post-surgical adhesions  - surgical intervention if doesn't improve was also discussed             - medical management comorbidities as per medical team             - ambulation encouraged              - DVT prophylaxis  All of the above  findings and recommendations were discussed with the patient, and all of his questions were answered to his expressed satisfaction.  -- Edison Simon, PA-C [facility] 06/29/2018, 7:25 AM [phone] M-F: 7am - 4pm

## 2018-06-29 NOTE — Progress Notes (Signed)
[place] [facility] [place] NAME: Keith Bryant    MR#:  [id number]  DATE OF BIRTH:  1931-02-27  SUBJECTIVE:  CHIEF COMPLAINT:   Chief Complaint  Patient presents with  . Emesis  . GI Bleeding    Vomiting blood-dark    -Came in with bowel obstruction, prior abdominal surgery. -NG tube for decompression, greater than 2.2 L of fluid -Receiving IV fluids, congested on exam  REVIEW OF SYSTEMS:  Review of Systems  Constitutional: Negative for chills and fever.  HENT: Positive for hearing loss. Negative for congestion, ear discharge and nosebleeds.        Dry mouth  Respiratory: Negative for cough, shortness of breath and wheezing.   Cardiovascular: Negative for chest pain and palpitations.  Gastrointestinal: Positive for abdominal pain, nausea and vomiting. Negative for constipation and diarrhea.  Genitourinary: Negative for dysuria.  Musculoskeletal: Negative for myalgias.  Neurological: Negative for dizziness, speech change, focal weakness, seizures, weakness and headaches.  Psychiatric/Behavioral: Negative for depression.    DRUG ALLERGIES:  No Known Allergies  VITALS:  Blood pressure (!) 138/56, pulse 79, temperature 97.6 F (36.4 C), temperature source Oral, resp. rate 19, height 6' (1.829 m), weight 100.2 kg, SpO2 95 %.  PHYSICAL EXAMINATION:  Physical Exam   GENERAL:  84 y.o.-year-old elderly patient lying in the bed with no acute distress.  EYES: Pupils equal, round, reactive to light and accommodation. No scleral icterus. Extraocular muscles intact.  HEENT: Head atraumatic, normocephalic. Oropharynx and nasopharynx clear.  NECK:  Supple, no jugular venous distention. No thyroid enlargement, no tenderness.  LUNGS: Moving air bilaterally but sounds congested especially upper airway sounds,, creased at the bases.  No wheezing, rales,rhonchi or crepitation. No use of accessory muscles of respiration.  CARDIOVASCULAR: S1, S2 normal. No  rubs, or gallops.  3/6 systolic murmur is present ABDOMEN: Soft, nontender, nondistended. Bowel sounds present. No organomegaly or mass.  EXTREMITIES: No pedal edema, cyanosis, or clubbing.  NEUROLOGIC: Cranial nerves II through XII are intact. Muscle strength 5/5 in all extremities. Sensation intact. Gait not checked.  Global weakness noted PSYCHIATRIC: The patient is alert and oriented x 3.  SKIN: No obvious rash, lesion, or ulcer.    LABORATORY PANEL:   CBC Recent Labs  Lab 06/29/18 0115  WBC 10.7*  HGB 16.1  HCT 49.5  PLT 133*   ------------------------------------------------------------------------------------------------------------------  Chemistries  Recent Labs  Lab 06/28/18 1711 06/29/18 0115  NA 143 143  K 3.8 3.4*  CL 102 97*  CO2 26 31  GLUCOSE 195* 215*  BUN 43* 53*  CREATININE 1.33* 1.64*  CALCIUM 9.4 9.2  AST 25  --   ALT 16  --   ALKPHOS 36*  --   BILITOT 1.3*  --    ------------------------------------------------------------------------------------------------------------------  Cardiac Enzymes No results for input(s): TROPONINI in the last 168 hours. ------------------------------------------------------------------------------------------------------------------  RADIOLOGY:  Ct Abdomen Pelvis W Contrast  Result Date: 06/28/2018 CLINICAL DATA:  Nausea and vomiting for several days EXAM: CT ABDOMEN AND PELVIS WITH CONTRAST TECHNIQUE: Multidetector CT imaging of the abdomen and pelvis was performed using the standard protocol following bolus administration of intravenous contrast. CONTRAST:  70mL OMNIPAQUE IOHEXOL 300 MG/ML  SOLN COMPARISON:  03/14/2016 FINDINGS: Lower chest: Mild patchy density in the left more than right lower lungs favors atelectasis. Borderline heart size with minimal pericardial calcification along the left ventricle. Fluid distended lower esophagus. Hepatobiliary: Coarse calcification in the capsular/subcapsular high right  liver.No evidence of biliary obstruction  or stone. Pancreas: Unremarkable. Spleen: Unremarkable. Adrenals/Urinary Tract: 15 mm right adrenal myelolipoma. No hydronephrosis or stone. Large (9.5 cm) left renal cyst. There are 2 adjacent left lower pole calculi that are likely from related urinary mass effect. Even if mural calcifications, the cyst has an overall benign appearance. Unremarkable bladder. Stomach/Bowel: Prior right hemicolectomy. There is dilated and fluid-filled small bowel with transition in the left upper quadrant where there is no evident mass or inflammation, suspected adhesion. Extensive diverticulosis. Vascular/Lymphatic: Atherosclerosis with notable advanced narrowing at the origins of the SMA and celiac. No acute vascular finding. No mass or adenopathy. Reproductive:Negative Other: No ascites or pneumoperitoneum. Musculoskeletal: Degenerative changes without acute finding. There is L4-5 grade 1 anterolisthesis related to facet arthropathy. Multilevel thoracic ankylosis from bridging osteophytes. IMPRESSION: 1. High-grade small bowel obstruction with transition in the left upper quadrant, presumably from adhesions. 2. Right hemicolectomy. 3. Colonic diverticulosis. Electronically Signed   By: Monte Fantasia M.D.   On: 06/28/2018 21:41   Dg Chest Port 1 View  Result Date: 06/28/2018 CLINICAL DATA:  Emesis EXAM: PORTABLE CHEST 1 VIEW COMPARISON:  03/06/2016 FINDINGS: Cardiomegaly with aortic atherosclerosis. Fluid level in the left upper quadrant. Mild left basilar atelectasis. No pneumothorax. IMPRESSION: Cardiomegaly with mild left basilar atelectasis. Electronically Signed   By: Donavan Foil M.D.   On: 06/28/2018 22:05   Dg Abd Portable 2 Views  Result Date: 06/28/2018 CLINICAL DATA:  Emesis EXAM: PORTABLE ABDOMEN - 2 VIEW COMPARISON:  CT 06/28/2018 FINDINGS: Minimal atelectasis at the left base. Lucency at the left diaphragm. Postsurgical changes in the right abdomen. Dilated loops  of small bowel in the left abdomen measuring up to 5.8 cm. Some colon gas is present. There is contrast within the bladder. There are phleboliths in the pelvis. IMPRESSION: 1. Dilated loops of small bowel in the left hemiabdomen, consistent with small bowel obstruction 2. Small amount of residual contrast within the renal collecting systems and bladder 3. Crescent-shaped lucency at the left diaphragm, suspected to be due to atelectasis and aerated lung. Electronically Signed   By: Donavan Foil M.D.   On: 06/28/2018 22:03    EKG:   Orders placed or performed during the hospital encounter of 06/28/18  . ED EKG  . ED EKG  . EKG 12-Lead  . EKG 12-Lead    ASSESSMENT AND PLAN:   84 year old male with past medical history significant for hypertension, skin cancer, or colon resection presents to hospital secondary to abdominal pain nausea and vomiting and noted to have small bowel obstruction  1.  Bowel obstruction-could be from adhesions.  High-grade bowel obstruction noted in the left upper quadrant. -Patient is status post right hemicolectomy and-management per surgical team.  N.p.o., NG tube to low intermittent suction at this time -Replace electrolytes as needed  2.  Dyspnea and hypoxia-receiving 2 L oxygen -On Unasyn for possible aspiration -Congested on exam.  Chest x-ray with cardiomegaly.  Repeat x-ray -Decrease the rate of IV fluids.  No recent echocardiogram noted  3.  Hypertension-currently on verapamil  4.  DVT prophylaxis-subcutaneous heparin  Patient states he is independent at home, lives with his wife. -We will get physical therapy once stable   All the records are reviewed and case discussed with Care Management/Social Workerr. Management plans discussed with the patient, family and they are in agreement.  CODE STATUS: Full code  TOTAL TIME TAKING CARE OF THIS PATIENT: 38 minutes.   POSSIBLE D/C IN 2 DAYS, DEPENDING ON CLINICAL CONDITION.   Gladstone Lighter  M.D  on 06/29/2018 at 11:11 AM  Between 7am to 6pm - Pager - [phone]  After 6pm go to [email or web address] - password [email or web address] [facility]  Office  [phone]  CC: Primary care physician; Katheren Shams

## 2018-06-30 LAB — CBC
HCT: 42.4 % (ref 39.0–52.0)
Hemoglobin: 13.4 g/dL (ref 13.0–17.0)
MCH: 29.8 pg (ref 26.0–34.0)
MCHC: 31.6 g/dL (ref 30.0–36.0)
MCV: 94.4 fL (ref 80.0–100.0)
PLATELETS: 100 10*3/uL — AB (ref 150–400)
RBC: 4.49 MIL/uL (ref 4.22–5.81)
RDW: 14.7 % (ref 11.5–15.5)
WBC: 8.6 10*3/uL (ref 4.0–10.5)
nRBC: 0 % (ref 0.0–0.2)

## 2018-06-30 LAB — BASIC METABOLIC PANEL
Anion gap: 10 (ref 5–15)
BUN: 44 mg/dL — ABNORMAL HIGH (ref 8–23)
CALCIUM: 8.3 mg/dL — AB (ref 8.9–10.3)
CO2: 32 mmol/L (ref 22–32)
CREATININE: 1.28 mg/dL — AB (ref 0.61–1.24)
Chloride: 105 mmol/L (ref 98–111)
GFR calc Af Amer: 57 mL/min — ABNORMAL LOW (ref >60)
GFR, EST NON AFRICAN AMERICAN: 49 mL/min — AB (ref >60)
Glucose, Bld: 86 mg/dL (ref 70–99)
Potassium: 3.4 mmol/L — ABNORMAL LOW (ref 3.5–5.1)
Sodium: 147 mmol/L — ABNORMAL HIGH (ref 135–145)

## 2018-06-30 LAB — BRAIN NATRIURETIC PEPTIDE: B Natriuretic Peptide: 216 pg/mL — ABNORMAL HIGH (ref 0.0–100.0)

## 2018-06-30 NOTE — Progress Notes (Signed)
[name] NAME: Keith Bryant    MR#:  [id number]  DATE OF BIRTH:  12/15/1930  SUBJECTIVE:  CHIEF COMPLAINT:   Chief Complaint  Patient presents with  . Emesis  . GI Bleeding    Vomiting blood-dark    -Decreased output from NG tube.  Patient is alert and oriented. -Had a bowel movement last night  REVIEW OF SYSTEMS:  Review of Systems  Constitutional: Negative for chills and fever.  HENT: Positive for hearing loss. Negative for congestion, ear discharge and nosebleeds.        Dry mouth  Respiratory: Negative for cough, shortness of breath and wheezing.   Cardiovascular: Negative for chest pain and palpitations.  Gastrointestinal: Positive for abdominal pain, nausea and vomiting. Negative for constipation and diarrhea.  Genitourinary: Negative for dysuria.  Musculoskeletal: Negative for myalgias.  Neurological: Negative for dizziness, speech change, focal weakness, seizures, weakness and headaches.  Psychiatric/Behavioral: Negative for depression.    DRUG ALLERGIES:  No Known Allergies  VITALS:  Blood pressure (!) 146/94, pulse 69, temperature 98.4 F (36.9 C), resp. rate 17, height 6' (1.829 m), weight 100.2 kg, SpO2 99 %.  PHYSICAL EXAMINATION:  Physical Exam   GENERAL:  84 y.o.-year-old elderly patient lying in the bed with no acute distress.  EYES: Pupils equal, round, reactive to light and accommodation. No scleral icterus. Extraocular muscles intact.  HEENT: Head atraumatic, normocephalic. Oropharynx and nasopharynx clear.  NECK:  Supple, no jugular venous distention. No thyroid enlargement, no tenderness.  LUNGS: Moving air bilaterally but sounds congested especially upper airway sounds,, creased at the bases.  No wheezing, rales,rhonchi or crepitation. No use of accessory muscles of respiration.  CARDIOVASCULAR: S1, S2 normal. No rubs, or gallops.  3/6 systolic murmur is present ABDOMEN: Soft, nontender,  non-distended. Bowel sounds present. No organomegaly or mass.  EXTREMITIES: No pedal edema, cyanosis, or clubbing.  NEUROLOGIC: Cranial nerves II through XII are intact. Muscle strength 5/5 in all extremities. Sensation intact. Gait not checked.  Global weakness noted PSYCHIATRIC: The patient is alert and oriented x 3.  SKIN: No obvious rash, lesion, or ulcer.    LABORATORY PANEL:   CBC Recent Labs  Lab 06/30/18 0429  WBC 8.6  HGB 13.4  HCT 42.4  PLT 100*   ------------------------------------------------------------------------------------------------------------------  Chemistries  Recent Labs  Lab 06/28/18 1711  06/30/18 0429  NA 143   < > 147*  K 3.8   < > 3.4*  CL 102   < > 105  CO2 26   < > 32  GLUCOSE 195*   < > 86  BUN 43*   < > 44*  CREATININE 1.33*   < > 1.28*  CALCIUM 9.4   < > 8.3*  AST 25  --   --   ALT 16  --   --   ALKPHOS 36*  --   --   BILITOT 1.3*  --   --    < > = values in this interval not displayed.   ------------------------------------------------------------------------------------------------------------------  Cardiac Enzymes No results for input(s): TROPONINI in the last 168 hours. ------------------------------------------------------------------------------------------------------------------  RADIOLOGY:  Ct Abdomen Pelvis W Contrast  Result Date: 06/28/2018 CLINICAL DATA:  Nausea and vomiting for several days EXAM: CT ABDOMEN AND PELVIS WITH CONTRAST TECHNIQUE: Multidetector CT imaging of the abdomen and pelvis was performed using the standard protocol following bolus administration of intravenous contrast. CONTRAST:  14mL OMNIPAQUE IOHEXOL 300 MG/ML  SOLN COMPARISON:  03/14/2016 FINDINGS: Lower chest: Mild patchy density in the left more than right lower lungs favors atelectasis. Borderline heart size with minimal pericardial calcification along the left ventricle. Fluid distended lower esophagus. Hepatobiliary: Coarse calcification in  the capsular/subcapsular high right liver.No evidence of biliary obstruction or stone. Pancreas: Unremarkable. Spleen: Unremarkable. Adrenals/Urinary Tract: 15 mm right adrenal myelolipoma. No hydronephrosis or stone. Large (9.5 cm) left renal cyst. There are 2 adjacent left lower pole calculi that are likely from related urinary mass effect. Even if mural calcifications, the cyst has an overall benign appearance. Unremarkable bladder. Stomach/Bowel: Prior right hemicolectomy. There is dilated and fluid-filled small bowel with transition in the left upper quadrant where there is no evident mass or inflammation, suspected adhesion. Extensive diverticulosis. Vascular/Lymphatic: Atherosclerosis with notable advanced narrowing at the origins of the SMA and celiac. No acute vascular finding. No mass or adenopathy. Reproductive:Negative Other: No ascites or pneumoperitoneum. Musculoskeletal: Degenerative changes without acute finding. There is L4-5 grade 1 anterolisthesis related to facet arthropathy. Multilevel thoracic ankylosis from bridging osteophytes. IMPRESSION: 1. High-grade small bowel obstruction with transition in the left upper quadrant, presumably from adhesions. 2. Right hemicolectomy. 3. Colonic diverticulosis. Electronically Signed   By: Monte Fantasia M.D.   On: 06/28/2018 21:41   Dg Chest Port 1 View  Result Date: 06/29/2018 CLINICAL DATA:  Shortness of breath. EXAM: PORTABLE CHEST 1 VIEW COMPARISON:  Chest x-ray 06/28/2018. FINDINGS: NG tube noted with tip below left hemidiaphragm. Cardiomegaly with normal pulmonary vascularity. Mild left base. Small left pleural effusion. Similar finding noted on prior exam. No pneumothorax. IMPRESSION: 1.  NG tube noted with tip below left hemidiaphragm. 2.  Cardiomegaly.  No pulmonary venous congestion. 3. Left base atelectasis/infiltrate and small left pleural effusion. Similar findings noted on prior exam Electronically Signed   By: Marcello Moores  Register   On:  06/29/2018 11:49   Dg Chest Port 1 View  Result Date: 06/28/2018 CLINICAL DATA:  Emesis EXAM: PORTABLE CHEST 1 VIEW COMPARISON:  03/06/2016 FINDINGS: Cardiomegaly with aortic atherosclerosis. Fluid level in the left upper quadrant. Mild left basilar atelectasis. No pneumothorax. IMPRESSION: Cardiomegaly with mild left basilar atelectasis. Electronically Signed   By: Donavan Foil M.D.   On: 06/28/2018 22:05   Dg Abd Portable 2 Views  Result Date: 06/28/2018 CLINICAL DATA:  Emesis EXAM: PORTABLE ABDOMEN - 2 VIEW COMPARISON:  CT 06/28/2018 FINDINGS: Minimal atelectasis at the left base. Lucency at the left diaphragm. Postsurgical changes in the right abdomen. Dilated loops of small bowel in the left abdomen measuring up to 5.8 cm. Some colon gas is present. There is contrast within the bladder. There are phleboliths in the pelvis. IMPRESSION: 1. Dilated loops of small bowel in the left hemiabdomen, consistent with small bowel obstruction 2. Small amount of residual contrast within the renal collecting systems and bladder 3. Crescent-shaped lucency at the left diaphragm, suspected to be due to atelectasis and aerated lung. Electronically Signed   By: Donavan Foil M.D.   On: 06/28/2018 22:03    EKG:   Orders placed or performed during the hospital encounter of 06/28/18  . ED EKG  . ED EKG  . EKG 12-Lead  . EKG 12-Lead  . EKG    ASSESSMENT AND PLAN:   84 year old male with past medical history significant for hypertension, skin cancer, or colon resection presents to hospital secondary to abdominal pain nausea and vomiting and noted to have small bowel obstruction  1.  Bowel obstruction-could be from adhesions.  High-grade bowel obstruction noted  in the left upper quadrant. -Patient is status post right hemicolectomy and-management per surgical team.   -Remains n.p.o.  NG in place, continue low intermittent suction today. -KUB in a.m.  Likely NG might be removed tomorrow.    -Replace  electrolytes as needed  2.  Dyspnea and hypoxia-receiving 2 L oxygen -On Unasyn for possible aspiration -Congested on exam.  Chest x-ray with cardiomegaly.  Repeat x-ray -Discontinue IV fluids.  Echocardiogram today.  Wean oxygen as tolerated  3.  Hypertension-currently on verapamil  4.  DVT prophylaxis-subcutaneous heparin  Patient states he is independent at home, lives with his wife. -physical therapy recommended home health   All the records are reviewed and case discussed with Care Management/Social Workerr. Management plans discussed with the patient, family and they are in agreement.  CODE STATUS: Full code  TOTAL TIME TAKING CARE OF THIS PATIENT: 38 minutes.   POSSIBLE D/C IN 2 DAYS, DEPENDING ON CLINICAL CONDITION.   Gladstone Lighter M.D on 06/30/2018 at 2:54 PM  Between 7am to 6pm - Pager - [phone]  After 6pm go to [email or web address] - password [email or web address] [facility]  Office  [phone]  CC: Primary care physician; Katheren Shams

## 2018-06-30 NOTE — Evaluation (Signed)
Physical Therapy Evaluation Patient Details Name: Keith A Culbreath Sr. MRN: [id number] DOB: 1931-03-08 Today's Date: 06/30/2018   History of Present Illness  Pt is an 84 year old male with past medical history significant for hypertension, skin cancer, and colon resection presented to hospital secondary to abdominal pain nausea and vomiting and noted to have small bowel obstruction possibly secondary to adhesions.  NG placed with assessment also including dyspnea and hypoxia.      Clinical Impression  Pt presents with mild deficits in strength, transfers, gait, and activity tolerance but overall performed well during the session.  Pt was Ind with bed mobility tasks and SBA with transfers with good control and stability.  Pt was able to amb 200' with a RW and SBA with slow cadence but was steady without LOB.  Pt reported feeling somewhat weaker than baseline and reported feeling safer walking with the RW than using no AD which is his baseline.  Pt will benefit from HHPT services upon discharge to safely address above deficits for decreased risk of further functional decline and eventual return to PLOF.      Follow Up Recommendations Home health PT    Equipment Recommendations  None recommended by PT    Recommendations for Other Services       Precautions / Restrictions Precautions Precautions: Fall Restrictions Weight Bearing Restrictions: No      Mobility  Bed Mobility Overal bed mobility: Independent                Transfers Overall transfer level: Needs assistance Equipment used: Rolling walker (2 wheeled) Transfers: Sit to/from Stand Sit to Stand: Supervision         General transfer comment: Good control and stability with transfers  Ambulation/Gait Ambulation/Gait assistance: Supervision Gait Distance (Feet): 200 Feet Assistive device: Rolling walker (2 wheeled) Gait Pattern/deviations: Step-through pattern;Decreased step length - right;Decreased step length -  left;Trunk flexed Gait velocity: Decreased   General Gait Details: Slow cadence with amb but steady without LOB and no adverse symptoms noted  Stairs            Wheelchair Mobility    Modified Rankin (Stroke Patients Only)       Balance Overall balance assessment: No apparent balance deficits (not formally assessed)                                           Pertinent Vitals/Pain Pain Assessment: No/denies pain    Home Living Family/patient expects to be discharged to:: Private residence Living Arrangements: Spouse/significant other Available Help at Discharge: Family;Available 24 hours/day Type of Home: House Home Access: Stairs to enter Entrance Stairs-Rails: None Entrance Stairs-Number of Steps: 2 Home Layout: Two level;Other (Comment)(Bedroom on the 1st floor but wife sleeps there, pt sleeps in upstairs bedroom)        Prior Function Level of Independence: Independent         Comments: Ind amb without AD, Ind with ADLs, 2 falls in the last year outdoors in the grass     Hand Dominance        Extremity/Trunk Assessment   Upper Extremity Assessment Upper Extremity Assessment: Overall WFL for tasks assessed    Lower Extremity Assessment Lower Extremity Assessment: Generalized weakness       Communication   Communication: No difficulties  Cognition Arousal/Alertness: Awake/alert Behavior During Therapy: WFL for tasks assessed/performed Overall Cognitive Status: Within Functional  Limits for tasks assessed                                        General Comments      Exercises Total Joint Exercises Ankle Circles/Pumps: Strengthening;Both;10 reps Quad Sets: Strengthening;Both;10 reps Gluteal Sets: Strengthening;Both;10 reps Heel Slides: AROM;Both;10 reps Long Arc Quad: Strengthening;Both;10 reps Knee Flexion: Strengthening;Both;10 reps Marching in Standing: AROM;Both;10 reps;Standing   Assessment/Plan     PT Assessment Patient needs continued PT services  PT Problem List Decreased strength;Decreased activity tolerance;Decreased knowledge of use of DME       PT Treatment Interventions DME instruction;Gait training;Stair training;Functional mobility training;Balance training;Therapeutic exercise;Therapeutic activities;Patient/family education    PT Goals (Current goals can be found in the Care Plan section)  Acute Rehab PT Goals Patient Stated Goal: To return home PT Goal Formulation: With patient Time For Goal Achievement: 07/13/18 Potential to Achieve Goals: Good    Frequency Min 2X/week   Barriers to discharge        Co-evaluation               AM-PAC PT "6 Clicks" Daily Activity  Outcome Measure Difficulty turning over in bed (including adjusting bedclothes, sheets and blankets)?: None Difficulty moving from lying on back to sitting on the side of the bed? : None Difficulty sitting down on and standing up from a chair with arms (e.g., wheelchair, bedside commode, etc,.)?: None Help needed moving to and from a bed to chair (including a wheelchair)?: None Help needed walking in hospital room?: A Little Help needed climbing 3-5 steps with a railing? : A Little 6 Click Score: 22    End of Session Equipment Utilized During Treatment: Gait belt;Oxygen Activity Tolerance: Patient tolerated treatment well Patient left: in chair;with chair alarm set;with call bell/phone within reach Nurse Communication: Mobility status PT Visit Diagnosis: Difficulty in walking, not elsewhere classified (R26.2);Muscle weakness (generalized) (M62.81)    Time: 1700-1749 PT Time Calculation (min) (ACUTE ONLY): 39 min   Charges:   PT Evaluation $PT Eval Low Complexity: 1 Low PT Treatments $Therapeutic Exercise: 8-22 mins        D. Scott Amjad Fikes PT, DPT 06/30/18, 1:23 PM

## 2018-06-30 NOTE — Progress Notes (Signed)
Pharmacy Antibiotic Note  Keith Bryant. is a 84 y.o. male admitted on 06/28/2018 with aspiration pneumonia.  Pharmacy has been consulted for Unasyn dosing.  Plan: Continue Unasyn 3 grams q 6 hours   Height: 6' (182.9 cm) Weight: 221 lb (100.2 kg) IBW/kg (Calculated) : 77.6  Temp (24hrs), Avg:98.4 F (36.9 C), Min:98.2 F (36.8 C), Max:98.6 F (37 C)  Recent Labs  Lab 06/28/18 1711 06/29/18 0115 06/30/18 0429  WBC 8.4 10.7* 8.6  CREATININE 1.33* 1.64* 1.28*    Estimated Creatinine Clearance: 51.7 mL/min (A) (by C-G formula based on SCr of 1.28 mg/dL (H)).    No Known Allergies  Antimicrobials this admission: Unasyn 10/17  >>  Clindamycin x 1 dose 10/16  Dose adjustments this admission: No dosing adj warranted at this time  CrCl > 30 ml/min  Microbiology results: 10/17 BCx: pending   Thank you for allowing pharmacy to be a part of this patient's care.  Lu Duffel, PharmD Clinical Pharmacist 06/30/2018 12:59 PM

## 2018-06-30 NOTE — Care Management Note (Signed)
Case Management Note  Patient Details  Name: Keith A Barbara Sr. MRN: [id number] Date of Birth: June 26, 1931  Subjective/Objective:       Patient is admitted with intestinal adhesions with complete obstruction, pt has and NG to suction and acute O2 via [place].  Pt lives at home with wife.  Patient is independent in ADL's and requires no assistive devices.  PCP is Dr. Wynetta Emery in [facility] and wife states he has an upcoming appointment.  He is able to drive and goes grocery shopping.  He uses mail order pharmacy [facility].  PT recommended HHPT but patient refuses.                Action/Plan:   Expected Discharge Date:                  Expected Discharge Plan:     In-House Referral:     Discharge planning Services     Post Acute Care Choice:    Choice offered to:     DME Arranged:    DME Agency:     HH Arranged:    HH Agency:     Status of Service:     If discussed at [employer] of Stay Meetings, dates discussed:    Additional Comments:  Shelbie Hutching, RN 06/30/2018, 4:03 PM

## 2018-06-30 NOTE — Progress Notes (Addendum)
SURGICAL PROGRESS NOTE (cpt: [phone])  Patient seen and examined as described below with surgical PA-C, Ardell Isaacs.  Assessment/Plan: (ICD-10's: K56.52) In summary, this patient is an 84 y.o. malewith recurrent complete small bowel obstruction, likely attributable to post-surgical adhesions following Right colectomy, complicated by possible aspiration pneumonia, AKI superimposed on CKD, mild leukocytosis, and by pertinent comorbidities including advanced chronological age, HTN, HLD, and history of diverticulitis.  - NPO for now, IV fluids - NG tube for nasogastric decompression - monitor ongoing bowel function and abdominal exam  - anticipate symptomatic relief within 24 - 48 hours following NGT insertion, followed by "rumbling" the following day and flatus either the same day or the day following the "rumbling" with anticipated length of stay ~3 - 5 days with successful non-operative management for 8 of 10 patients with small bowel obstruction attributed to post-surgical adhesions - medical management of possible aspiration pneumonia and comorbidities as per medical team             - surgical intervention if doesn't improve was also discussed - ambulation encouraged - DVT prophylaxis  I have personally reviewed the patient's chart, evaluated/examined the patient, proposed the recommended management, and discussed these recommendations with the patient to his expressed satisfaction as well as with patient's RN.  Thank you for the opportunity to participate in this patient's care.  -- Marilynne Drivers Rosana Hoes, MD, [place]: [facility] - Partnering for exceptional care. Office: [phone]      SURGICAL PROGRESS NOTE (cpt [phone])  Hospital Day(s): 2.   Post op day(s):  Marland Kitchen   Interval History: Patient seen and examined, no acute events or new complaints overnight.  Patient reports improvement in his abdominal discomfort which is minimal today at most. He denies fever, chills, nausea, or emesis. He did have a large BM but denied passing flatus. NPO. About 200 from NGT.   Review of Systems:  Constitutional: denies fever, chills  HEENT: denies cough or congestion  Respiratory: denies any shortness of breath  Cardiovascular: denies chest pain or palpitations  Gastrointestinal: + abdominal pain, denied N/V, or diarrhea/and bowel function as per interval history Genitourinary: denies burning with urination or urinary frequency Musculoskeletal: denies pain, decreased motor or sensation Integumentary: denies any other rashes or skin discolorations Neurological: denies HA or vision/hearing changes   Vital signs in last 24 hours: [min-max] current  Temp:  [98.1 F (36.7 C)-98.6 F (37 C)] 98.6 F (37 C) (10/18 0420) Pulse Rate:  [42-79] 71 (10/18 0420) Resp:  [16-20] 16 (10/18 0420) BP: (133-149)/(56-78) 133/66 (10/18 0420) SpO2:  [95 %-98 %] 97 % (10/18 0420)     Height: 6' (182.9 cm) Weight: 100.2 kg BMI (Calculated): 29.97   Intake/Output this shift:  No intake/output data recorded.   Intake/Output last 2 shifts:  @IOLAST2SHIFTS @   Physical Exam:  Constitutional: alert, cooperative and no distress  HENT: normocephalic without obvious abnormality, NGT in place  Eyes: PERRL Neuro: CN II - XII grossly intact and symmetric without deficit  Respiratory: breathing non-labored at rest  Cardiovascular: regular rate and sinus rhythm  Gastrointestinal: soft, non-tender, and non-distended Musculoskeletal: UE and LE FROM, no edema or wounds, motor and sensation grossly intact, NT    Labs:  CBC Latest Ref Rng & Units 06/30/2018 06/29/2018 06/28/2018  WBC 4.0 - 10.5 K/uL 8.6 10.7(H) 8.4  Hemoglobin 13.0 - 17.0 g/dL 13.4 16.1 16.2  Hematocrit 39.0 - 52.0 % 42.4 49.5 50.1  Platelets 150 - 400 K/uL 100(L) 133(L)  135(L)   CMP Latest Ref Rng & Units  06/30/2018 06/29/2018 06/28/2018  Glucose 70 - 99 mg/dL 86 215(H) 195(H)  BUN 8 - 23 mg/dL 44(H) 53(H) 43(H)  Creatinine 0.61 - 1.24 mg/dL 1.28(H) 1.64(H) 1.33(H)  Sodium 135 - 145 mmol/L 147(H) 143 143  Potassium 3.5 - 5.1 mmol/L 3.4(L) 3.4(L) 3.8  Chloride 98 - 111 mmol/L 105 97(L) 102  CO2 22 - 32 mmol/L 32 31 26  Calcium 8.9 - 10.3 mg/dL 8.3(L) 9.2 9.4  Total Protein 6.5 - 8.1 g/dL - - 7.6  Total Bilirubin 0.3 - 1.2 mg/dL - - 1.3(H)  Alkaline Phos 38 - 126 U/L - - 36(L)  AST 15 - 41 U/L - - 25  ALT 0 - 44 U/L - - 16     Imaging studies: No new pertinent imaging studies   Assessment/Plan: (ICD-10's: K56.52) 84 y.o. malewith recurrent complete small bowel obstruction, likely attributable to post-surgical adhesions following Right colectomy, complicated by possible aspiration pneumonia, improving AKI superimposed on CKD, mild leukocytosis, and by pertinent comorbidities including advanced chronological age, HTN, HLD, and history of diverticulitis.   - NPO for now, IV fluids - NG tube for nasogastric decompression, consider removing tomorrow - monitor ongoing bowel function and abdominal exam  - anticipate symptomatic relief within 24 - 48 hours following NGT insertion, followed by "rumbling" the following day and flatus either the same day or the day following the "rumbling" with anticipated length of stay ~3 - 5 days with successful non-operative management for 8 of 10 patients with small bowel obstruction attributed to post-surgical adhesions - medical management of possible aspiration pneumonia and comorbidities as per medical team             - surgical intervention if doesn't improve was also discussed - ambulation encouraged - DVT prophylaxis   All of the above findings and recommendations were discussed with the patient, and the medical team, and all of patient's questions were answered to his expressed  satisfaction.  -- Edison Simon, PA-C [facility] 06/30/2018, 9:27 AM [phone] M-F: 7am - 4pm

## 2018-06-30 NOTE — Care Management Important Message (Signed)
Copy of signed IM left with patient in room.  

## 2018-07-01 ENCOUNTER — Inpatient Hospital Stay: Payer: Medicare Other

## 2018-07-01 ENCOUNTER — Inpatient Hospital Stay: Admit: 2018-07-01 | Payer: Medicare Other

## 2018-07-01 LAB — BASIC METABOLIC PANEL
Anion gap: 15 (ref 5–15)
BUN: 32 mg/dL — AB (ref 8–23)
CO2: 22 mmol/L (ref 22–32)
Calcium: 8.5 mg/dL — ABNORMAL LOW (ref 8.9–10.3)
Chloride: 111 mmol/L (ref 98–111)
Creatinine, Ser: 1.09 mg/dL (ref 0.61–1.24)
GFR calc Af Amer: >60 mL/min (ref >60)
GFR, EST NON AFRICAN AMERICAN: 60 mL/min — AB (ref >60)
Glucose, Bld: 75 mg/dL (ref 70–99)
POTASSIUM: 3.7 mmol/L (ref 3.5–5.1)
Sodium: 148 mmol/L — ABNORMAL HIGH (ref 135–145)

## 2018-07-01 MED ORDER — MAGNESIUM OXIDE 400 (241.3 MG) MG PO TABS
400.0000 mg | ORAL_TABLET | Freq: Every day | ORAL | Status: DC
  Administered 2018-07-01 – 2018-07-03 (×3): 400 mg via ORAL
  Filled 2018-07-01 (×3): qty 1

## 2018-07-01 MED ORDER — ASPIRIN 81 MG PO CHEW
81.0000 mg | CHEWABLE_TABLET | Freq: Every day | ORAL | Status: DC | PRN
  Administered 2018-07-02: 81 mg via ORAL
  Filled 2018-07-01: qty 1

## 2018-07-01 MED ORDER — ADULT MULTIVITAMIN W/MINERALS CH
1.0000 | ORAL_TABLET | Freq: Every day | ORAL | Status: DC
  Administered 2018-07-01 – 2018-07-03 (×3): 1 via ORAL
  Filled 2018-07-01 (×3): qty 1

## 2018-07-01 MED ORDER — OXYBUTYNIN CHLORIDE ER 5 MG PO TB24
5.0000 mg | ORAL_TABLET | Freq: Every day | ORAL | Status: DC
  Administered 2018-07-01 – 2018-07-03 (×3): 5 mg via ORAL
  Filled 2018-07-01 (×3): qty 1

## 2018-07-01 MED ORDER — PANTOPRAZOLE SODIUM 40 MG PO TBEC
40.0000 mg | DELAYED_RELEASE_TABLET | Freq: Every day | ORAL | Status: DC
  Administered 2018-07-01 – 2018-07-03 (×3): 40 mg via ORAL
  Filled 2018-07-01 (×3): qty 1

## 2018-07-01 MED ORDER — TAMSULOSIN HCL 0.4 MG PO CAPS
0.4000 mg | ORAL_CAPSULE | Freq: Every day | ORAL | Status: DC
  Administered 2018-07-01 – 2018-07-03 (×3): 0.4 mg via ORAL
  Filled 2018-07-01 (×3): qty 1

## 2018-07-01 MED ORDER — VITAMIN B-12 1000 MCG PO TABS
1000.0000 ug | ORAL_TABLET | Freq: Every day | ORAL | Status: DC
  Administered 2018-07-01 – 2018-07-03 (×3): 1000 ug via ORAL
  Filled 2018-07-01 (×3): qty 1

## 2018-07-01 NOTE — Progress Notes (Signed)
[facility] [place] NAME: Keith Bryant    MR#:  [id number]  DATE OF BIRTH:  05/10/1931  SUBJECTIVE:  CHIEF COMPLAINT:   Chief Complaint  Patient presents with  . Emesis  . GI Bleeding    Vomiting blood-dark    -Minimal output from the NG.  Complains of dryness of the mouth -No abdominal pain or nausea vomiting today.  Had a bowel movement yesterday and passing flatus  REVIEW OF SYSTEMS:  Review of Systems  Constitutional: Negative for chills and fever.  HENT: Positive for hearing loss. Negative for congestion, ear discharge and nosebleeds.   Respiratory: Negative for cough, shortness of breath and wheezing.   Cardiovascular: Negative for chest pain and palpitations.  Gastrointestinal: Negative for abdominal pain, constipation, diarrhea, nausea and vomiting.  Genitourinary: Negative for dysuria.  Musculoskeletal: Negative for myalgias.  Neurological: Negative for dizziness, speech change, focal weakness, seizures, weakness and headaches.  Psychiatric/Behavioral: Negative for depression.    DRUG ALLERGIES:  No Known Allergies  VITALS:  Blood pressure (!) 137/45, pulse (!) 54, temperature 97.8 F (36.6 C), temperature source Oral, resp. rate 18, height 6' (1.829 m), weight 100.2 kg, SpO2 97 %.  PHYSICAL EXAMINATION:  Physical Exam   GENERAL:  84 y.o.-year-old elderly patient lying in the bed with no acute distress.  EYES: Pupils equal, round, reactive to light and accommodation. No scleral icterus. Extraocular muscles intact.  HEENT: Head atraumatic, normocephalic. Oropharynx and nasopharynx clear.  NECK:  Supple, no jugular venous distention. No thyroid enlargement, no tenderness.  LUNGS: Moving air bilaterally ,, decreased at the bases.  No wheezing, rales,rhonchi or crepitation. No use of accessory muscles of respiration.  CARDIOVASCULAR: S1, S2 normal. No rubs, or gallops.  3/6 systolic murmur is present ABDOMEN: Soft,  nontender, non-distended. Bowel sounds present. No organomegaly or mass.  EXTREMITIES: No pedal edema, cyanosis, or clubbing.  NEUROLOGIC: Cranial nerves II through XII are intact. Muscle strength 5/5 in all extremities. Sensation intact. Gait not checked.  Global weakness noted PSYCHIATRIC: The patient is alert and oriented x 3.  SKIN: No obvious rash, lesion, or ulcer.    LABORATORY PANEL:   CBC Recent Labs  Lab 06/30/18 0429  WBC 8.6  HGB 13.4  HCT 42.4  PLT 100*   ------------------------------------------------------------------------------------------------------------------  Chemistries  Recent Labs  Lab 06/28/18 1711  07/01/18 0530  NA 143   < > 148*  K 3.8   < > 3.7  CL 102   < > 111  CO2 26   < > 22  GLUCOSE 195*   < > 75  BUN 43*   < > 32*  CREATININE 1.33*   < > 1.09  CALCIUM 9.4   < > 8.5*  AST 25  --   --   ALT 16  --   --   ALKPHOS 36*  --   --   BILITOT 1.3*  --   --    < > = values in this interval not displayed.   ------------------------------------------------------------------------------------------------------------------  Cardiac Enzymes No results for input(s): TROPONINI in the last 168 hours. ------------------------------------------------------------------------------------------------------------------  RADIOLOGY:  Dg Chest 2 View  Result Date: 07/01/2018 CLINICAL DATA:  Congestive heart failure. EXAM: CHEST - 2 VIEW COMPARISON:  One-view chest x-ray 06/29/2018 FINDINGS: The heart is enlarged. Aortic atherosclerosis is present. There is no edema or effusion. Retrocardiac airspace disease and small left pleural effusion are stable. IMPRESSION: 1. Cardiomegaly without failure. 2. Aortic atherosclerosis. 3.  Persistent left basilar airspace disease and effusion. Infection is not excluded. Electronically Signed   By: San Morelle M.D.   On: 07/01/2018 08:22   Dg Abd 1 View  Result Date: 07/01/2018 CLINICAL DATA:  84 year old male  with a history of prior ileus EXAM: ABDOMEN - 1 VIEW COMPARISON:  06/28/2018, CT 06/28/2018 FINDINGS: Gastric tube terminates within the left upper quadrant. The side port is near the GE junction. No significant gastric distention with no significant gastric air. Small amount of small bowel and colonic air. No abnormal distention. Surgical clips within the right abdomen. Gas extends to the rectum. IMPRESSION: No evidence of bowel obstruction. Gastric tube terminates in the left upper quadrant with the side port near the GE junction. Surgical changes of the right abdomen. Electronically Signed   By: Corrie Mckusick D.O.   On: 07/01/2018 08:23   Dg Chest Port 1 View  Result Date: 06/29/2018 CLINICAL DATA:  Shortness of breath. EXAM: PORTABLE CHEST 1 VIEW COMPARISON:  Chest x-ray 06/28/2018. FINDINGS: NG tube noted with tip below left hemidiaphragm. Cardiomegaly with normal pulmonary vascularity. Mild left base. Small left pleural effusion. Similar finding noted on prior exam. No pneumothorax. IMPRESSION: 1.  NG tube noted with tip below left hemidiaphragm. 2.  Cardiomegaly.  No pulmonary venous congestion. 3. Left base atelectasis/infiltrate and small left pleural effusion. Similar findings noted on prior exam Electronically Signed   By: [place]   On: 06/29/2018 11:49    EKG:   Orders placed or performed during the hospital encounter of 06/28/18  . ED EKG  . ED EKG  . EKG 12-Lead  . EKG 12-Lead  . EKG    ASSESSMENT AND PLAN:   84 year old male with past medical history significant for hypertension, skin cancer, or colon resection presents to hospital secondary to abdominal pain nausea and vomiting and noted to have small bowel obstruction  1.  Bowel obstruction-could be from adhesions.  High-grade bowel obstruction noted in the left upper quadrant on admission. -Patient has h/o right hemicolectomy and-management per surgical team.   -Remains n.p.o.  NG in place, -minimal output today.   KUB showing improvement. -Discontinue NG tube- start on clear liquid diet -Replace electrolytes as needed  2.  Dyspnea and hypoxia-receiving 2 L oxygen -On Unasyn for possible aspiration -Congested on exam.  Chest x-ray with cardiomegaly and left basilar infiltrate -Discontinued IV fluids.  Echocardiogram follow up.  Wean oxygen as tolerated -Incentive spirometry -Continue IV antibiotics today and change to oral tomorrow  3.  Hypertension-on verapamil Hold Cardizem as bradycardic  4.  BPH-continue home medications  5.  DVT prophylaxis-subcutaneous heparin  Patient states he is independent at home, lives with his wife. -physical therapy recommended home health -Encourage ambulation   All the records are reviewed and case discussed with Care Management/Social Workerr. Management plans discussed with the patient, family and they are in agreement.  CODE STATUS: Full code  TOTAL TIME TAKING CARE OF THIS PATIENT: 37 minutes.   POSSIBLE D/C TOMORROW, DEPENDING ON CLINICAL CONDITION.   Gladstone Lighter M.D on 07/01/2018 at 9:17 AM  Between 7am to 6pm - Pager - [phone]  After 6pm go to [email or web address] - password [email or web address] [facility]  Office  [phone]  CC: Primary care physician; Katheren Shams

## 2018-07-01 NOTE — Progress Notes (Signed)
[facility] Hospital Day(s): 3.   Post op day(s):  Marland Kitchen   Interval History: Patient seen and examined, no acute events or new complaints overnight. Patient reports passing gas and had bowel movement, Denies nausea or vomiting after clear liquid diet trial.  Vital signs in last 24 hours: [min-max] current  Temp:  [96.7 F (35.9 C)-98 F (36.7 C)] 97.4 F (36.3 C) (10/19 1225) Pulse Rate:  [54-64] 55 (10/19 1225) Resp:  [17-20] 18 (10/19 1225) BP: (135-160)/(45-53) 160/52 (10/19 1225) SpO2:  [97 %-99 %] 97 % (10/19 1225)     Height: 6' (182.9 cm) Weight: 100.2 kg BMI (Calculated): 29.97    Physical Exam:  Constitutional: alert, cooperative and no distress  Gastrointestinal: soft, non-tender, and non-distended  Labs:  CBC Latest Ref Rng & Units 06/30/2018 06/29/2018 06/28/2018  WBC 4.0 - 10.5 K/uL 8.6 10.7(H) 8.4  Hemoglobin 13.0 - 17.0 g/dL 13.4 16.1 16.2  Hematocrit 39.0 - 52.0 % 42.4 49.5 50.1  Platelets 150 - 400 K/uL 100(L) 133(L) 135(L)   CMP Latest Ref Rng & Units 07/01/2018 06/30/2018 06/29/2018  Glucose 70 - 99 mg/dL 75 86 215(H)  BUN 8 - 23 mg/dL 32(H) 44(H) 53(H)  Creatinine 0.61 - 1.24 mg/dL 1.09 1.28(H) 1.64(H)  Sodium 135 - 145 mmol/L 148(H) 147(H) 143  Potassium 3.5 - 5.1 mmol/L 3.7 3.4(L) 3.4(L)  Chloride 98 - 111 mmol/L 111 105 97(L)  CO2 22 - 32 mmol/L 22 32 31  Calcium 8.9 - 10.3 mg/dL 8.5(L) 8.3(L) 9.2  Total Protein 6.5 - 8.1 g/dL - - -  Total Bilirubin 0.3 - 1.2 mg/dL - - -  Alkaline Phos 38 - 126 U/L - - -  AST 15 - 41 U/L - - -  ALT 0 - 44 U/L - - -    Imaging studies:  EXAM: ABDOMEN - 1 VIEW  COMPARISON:  06/28/2018, CT 06/28/2018  FINDINGS: Gastric tube terminates within the left upper quadrant. The side port is near the GE junction.  No significant gastric distention with no significant gastric air. Small amount of small bowel and colonic air. No abnormal distention. Surgical clips within the right abdomen. Gas extends  to the rectum.  IMPRESSION: No evidence of bowel obstruction.  Gastric tube terminates in the left upper quadrant with the side port near the GE junction.  Surgical changes of the right abdomen.  Assessment/Plan:  This patient is an 84 y.o.malewithrecurrentcomplete small bowel obstruction, likely attributable to post-surgical adhesions followingRight colectomy, complicated bypossible aspiration pneumonia,AKIsuperimposed on CKD,mild leukocytosis, and bypertinent comorbidities includingadvanced chronological age,HTN, HLD,andhistory of diverticulitis. Today with persistent flatus and xray showing no bowel obstructive pattern. Tolerated clear liquid diet. Will follow clinically and advance as tolerated.  [facility] recommendations and management of medical conditions.   Arnold Long, MD

## 2018-07-01 NOTE — Plan of Care (Signed)

## 2018-07-02 ENCOUNTER — Inpatient Hospital Stay
Admit: 2018-07-02 | Discharge: 2018-07-02 | Disposition: A | Discharge status: Home / Self Care | Payer: Medicare Other | Attending: Internal Medicine | Admitting: Internal Medicine

## 2018-07-02 DIAGNOSIS — R600 Localized edema: Secondary | ICD-10-CM

## 2018-07-02 LAB — BASIC METABOLIC PANEL
ANION GAP: 10 (ref 5–15)
BUN: 26 mg/dL — AB (ref 8–23)
CHLORIDE: 109 mmol/L (ref 98–111)
CO2: 29 mmol/L (ref 22–32)
Calcium: 8.3 mg/dL — ABNORMAL LOW (ref 8.9–10.3)
Creatinine, Ser: 1.13 mg/dL (ref 0.61–1.24)
GFR calc Af Amer: >60 mL/min (ref >60)
GFR calc non Af Amer: 57 mL/min — ABNORMAL LOW (ref >60)
GLUCOSE: 130 mg/dL — AB (ref 70–99)
POTASSIUM: 3.1 mmol/L — AB (ref 3.5–5.1)
Sodium: 148 mmol/L — ABNORMAL HIGH (ref 135–145)

## 2018-07-02 MED ORDER — AMOXICILLIN-POT CLAVULANATE 875-125 MG PO TABS
1.0000 | ORAL_TABLET | Freq: Two times a day (BID) | ORAL | Status: DC
  Administered 2018-07-02 – 2018-07-03 (×3): 1 via ORAL
  Filled 2018-07-02 (×3): qty 1

## 2018-07-02 MED ORDER — POTASSIUM CHLORIDE CRYS ER 20 MEQ PO TBCR
40.0000 meq | EXTENDED_RELEASE_TABLET | ORAL | Status: AC
  Administered 2018-07-02 (×2): 40 meq via ORAL
  Filled 2018-07-02 (×2): qty 2

## 2018-07-02 MED ORDER — PERFLUTREN LIPID MICROSPHERE
1.0000 mL | INTRAVENOUS | Status: AC | PRN
  Administered 2018-07-02: 4 mL via INTRAVENOUS
  Filled 2018-07-02: qty 10

## 2018-07-02 MED ORDER — VERAPAMIL HCL ER 120 MG PO TBCR
120.0000 mg | EXTENDED_RELEASE_TABLET | Freq: Two times a day (BID) | ORAL | Status: DC
  Administered 2018-07-02 – 2018-07-03 (×3): 120 mg via ORAL
  Filled 2018-07-02 (×4): qty 1

## 2018-07-02 NOTE — Progress Notes (Signed)
Oxygen not required the patient was wean off oxygen. 96% at room air and 93% ambulating at room air.

## 2018-07-02 NOTE — Progress Notes (Signed)
[facility] [place] NAME: Keith Bryant    MR#:  [id number]  DATE OF BIRTH:  06/11/1931  SUBJECTIVE:  CHIEF COMPLAINT:   Chief Complaint  Patient presents with  . Emesis  . GI Bleeding    Vomiting blood-dark    - NG removed, tolerating clears well - passing flatus, off o2 now  REVIEW OF SYSTEMS:  Review of Systems  Constitutional: Negative for chills and fever.  HENT: Positive for hearing loss. Negative for congestion, ear discharge and nosebleeds.   Respiratory: Negative for cough, shortness of breath and wheezing.   Cardiovascular: Negative for chest pain and palpitations.  Gastrointestinal: Negative for abdominal pain, constipation, diarrhea, nausea and vomiting.  Genitourinary: Negative for dysuria.  Musculoskeletal: Negative for myalgias.  Neurological: Negative for dizziness, speech change, focal weakness, seizures, weakness and headaches.  Psychiatric/Behavioral: Negative for depression.    DRUG ALLERGIES:  No Known Allergies  VITALS:  Blood pressure (!) 143/58, pulse (!) 47, temperature 99 F (37.2 C), temperature source Oral, resp. rate 18, height 6' (1.829 m), weight 100.2 kg, SpO2 95 %.  PHYSICAL EXAMINATION:  Physical Exam   GENERAL:  84 y.o.-year-old elderly patient lying in the bed with no acute distress.  EYES: Pupils equal, round, reactive to light and accommodation. No scleral icterus. Extraocular muscles intact.  HEENT: Head atraumatic, normocephalic. Oropharynx and nasopharynx clear.  NECK:  Supple, no jugular venous distention. No thyroid enlargement, no tenderness.  LUNGS: Moving air bilaterally ,, decreased at the bases.  No wheezing, rales,rhonchi or crepitation. No use of accessory muscles of respiration.  CARDIOVASCULAR: S1, S2 normal. No rubs, or gallops.  3/6 systolic murmur is present ABDOMEN: Soft, nontender, non-distended. good Bowel sounds present. No organomegaly or mass.  EXTREMITIES: No pedal  edema, cyanosis, or clubbing.  NEUROLOGIC: Cranial nerves II through XII are intact. Muscle strength 5/5 in all extremities. Sensation intact. Gait not checked.  Global weakness noted PSYCHIATRIC: The patient is alert and oriented x 3.  SKIN: No obvious rash, lesion, or ulcer.    LABORATORY PANEL:   CBC Recent Labs  Lab 06/30/18 0429  WBC 8.6  HGB 13.4  HCT 42.4  PLT 100*   ------------------------------------------------------------------------------------------------------------------  Chemistries  Recent Labs  Lab 06/28/18 1711  07/02/18 0423  NA 143   < > 148*  K 3.8   < > 3.1*  CL 102   < > 109  CO2 26   < > 29  GLUCOSE 195*   < > 130*  BUN 43*   < > 26*  CREATININE 1.33*   < > 1.13  CALCIUM 9.4   < > 8.3*  AST 25  --   --   ALT 16  --   --   ALKPHOS 36*  --   --   BILITOT 1.3*  --   --    < > = values in this interval not displayed.   ------------------------------------------------------------------------------------------------------------------  Cardiac Enzymes No results for input(s): TROPONINI in the last 168 hours. ------------------------------------------------------------------------------------------------------------------  RADIOLOGY:  Dg Chest 2 View  Result Date: 07/01/2018 CLINICAL DATA:  Congestive heart failure. EXAM: CHEST - 2 VIEW COMPARISON:  One-view chest x-ray 06/29/2018 FINDINGS: The heart is enlarged. Aortic atherosclerosis is present. There is no edema or effusion. Retrocardiac airspace disease and small left pleural effusion are stable. IMPRESSION: 1. Cardiomegaly without failure. 2. Aortic atherosclerosis. 3. Persistent left basilar airspace disease and effusion. Infection is not excluded. Electronically Signed  By: San Morelle M.D.   On: 07/01/2018 08:22   Dg Abd 1 View  Result Date: 07/01/2018 CLINICAL DATA:  84 year old male with a history of prior ileus EXAM: ABDOMEN - 1 VIEW COMPARISON:  06/28/2018, CT 06/28/2018  FINDINGS: Gastric tube terminates within the left upper quadrant. The side port is near the GE junction. No significant gastric distention with no significant gastric air. Small amount of small bowel and colonic air. No abnormal distention. Surgical clips within the right abdomen. Gas extends to the rectum. IMPRESSION: No evidence of bowel obstruction. Gastric tube terminates in the left upper quadrant with the side port near the GE junction. Surgical changes of the right abdomen. Electronically Signed   By: Corrie Mckusick D.O.   On: 07/01/2018 08:23    EKG:   Orders placed or performed during the hospital encounter of 06/28/18  . ED EKG  . ED EKG  . EKG 12-Lead  . EKG 12-Lead  . EKG    ASSESSMENT AND PLAN:   84 year old male with past medical history significant for hypertension, skin cancer, or colon resection presents to hospital secondary to abdominal pain nausea and vomiting and noted to have small bowel obstruction  1.  Bowel obstruction-could be from adhesions.  High-grade bowel obstruction noted in the left upper quadrant on admission. -Patient has h/o right hemicolectomy and-management per surgical team.   -Passing flatus, NG tube removed.  Tolerating clear liquids well. -Diet will be advanced today.  Possible discharge later today or tomorrow per surgery  2.  Dyspnea and hypoxia --On Unasyn for possible aspiration -Off oxygen.  Improved while off of IV fluids.  Chest x-ray with cardiomegaly and left basilar infiltrate -Discontinued IV fluids.  Echocardiogram follow up-can be done as outpatient.   - off o2 now -Incentive spirometry -Change to oral Augmentin and can be discharged on the same for 7 more days  3.  Hypertension-on verapamil-dose reduced due to bradycardia Discontinue Cardizem  At discharge as well  4.  BPH-continue home medications  5.  DVT prophylaxis-subcutaneous heparin  6. Hypokalemia- replaced  Patient states he is independent at home, lives with his  wife. -physical therapy recommended home health -Encourage ambulation -Possible discharge later today.  Discussed with surgeon, will sign off.  Medically stable.   All the records are reviewed and case discussed with Care Management/Social Workerr. Management plans discussed with the patient, family and they are in agreement.  CODE STATUS: Full code  TOTAL TIME TAKING CARE OF THIS PATIENT:35 minutes.   POSSIBLE D/C today , DEPENDING ON CLINICAL CONDITION.   Cori Henningsen M.D on 07/02/2018 at 8:07 AM  Between 7am to 6pm - Pager - [phone]  After 6pm go to [email or web address] - password [email or web address] [facility]  Office  [phone]  CC: Primary care physician; Katheren Shams

## 2018-07-02 NOTE — Progress Notes (Signed)
*  PRELIMINARY RESULTS* Echocardiogram 2D Echocardiogram has been performed. Definity IV Contrast used on this study.  Keith Bryant 07/02/2018, 10:44 AM

## 2018-07-02 NOTE — Plan of Care (Signed)
The patient has been stable. Tolerating the diet changed from full liquid diet to soft diet this afternoon. Most likely D/C tomorrow if tolerating the diet change. The patient has been ambulating in the hallway. Pain has been stable. Voiding and had a bowel movement today.  Problem: Education: Goal: Knowledge of General Education information will improve Description Including pain rating scale, medication(s)/side effects and non-pharmacologic comfort measures 07/02/2018 1714 by Myles Rosenthal, Cory Roughen, RN Outcome: Progressing 07/02/2018 1413 by Myles Rosenthal, Cory Roughen, RN Outcome: Progressing   Problem: Health Behavior/Discharge Planning: Goal: Ability to manage health-related needs will improve 07/02/2018 1714 by Ronna Polio, RN Outcome: Progressing 07/02/2018 1413 by Myles Rosenthal, Cory Roughen, RN Outcome: Progressing   Problem: Clinical Measurements: Goal: Ability to maintain clinical measurements within normal limits will improve 07/02/2018 1714 by Ronna Polio, RN Outcome: Progressing 07/02/2018 1413 by Myles Rosenthal, Cory Roughen, RN Outcome: Progressing Goal: Will remain free from infection 07/02/2018 1714 by Ronna Polio, RN Outcome: Progressing 07/02/2018 1413 by Myles Rosenthal, Cory Roughen, RN Outcome: Progressing Goal: Diagnostic test results will improve 07/02/2018 1714 by Ronna Polio, RN Outcome: Progressing 07/02/2018 1413 by Myles Rosenthal, Cory Roughen, RN Outcome: Progressing Goal: Respiratory complications will improve 07/02/2018 1714 by Ronna Polio, RN Outcome: Progressing 07/02/2018 1413 by Myles Rosenthal, Cory Roughen, RN Outcome: Progressing Goal: Cardiovascular complication will be avoided 07/02/2018 1714 by Ronna Polio, RN Outcome: Progressing 07/02/2018 1413 by Ronna Polio, RN Outcome: Progressing   Problem: Activity: Goal: Risk for activity intolerance will decrease 07/02/2018 1714 by  Myles Rosenthal, Cory Roughen, RN Outcome: Progressing 07/02/2018 1413 by Myles Rosenthal, Cory Roughen, RN Outcome: Progressing   Problem: Nutrition: Goal: Adequate nutrition will be maintained 07/02/2018 1714 by Myles Rosenthal, Cory Roughen, RN Outcome: Progressing 07/02/2018 1413 by Myles Rosenthal, Cory Roughen, RN Outcome: Progressing   Problem: Coping: Goal: Level of anxiety will decrease 07/02/2018 1714 by Myles Rosenthal, Cory Roughen, RN Outcome: Progressing 07/02/2018 1413 by Myles Rosenthal, Cory Roughen, RN Outcome: Progressing   Problem: Elimination: Goal: Will not experience complications related to bowel motility 07/02/2018 1714 by Ronna Polio, RN Outcome: Progressing 07/02/2018 1413 by Myles Rosenthal, Cory Roughen, RN Outcome: Progressing Goal: Will not experience complications related to urinary retention 07/02/2018 1714 by Ronna Polio, RN Outcome: Progressing 07/02/2018 1413 by Myles Rosenthal, Cory Roughen, RN Outcome: Progressing   Problem: Pain Managment: Goal: General experience of comfort will improve 07/02/2018 1714 by Ronna Polio, RN Outcome: Progressing 07/02/2018 1413 by Myles Rosenthal, Cory Roughen, RN Outcome: Progressing   Problem: Safety: Goal: Ability to remain free from injury will improve 07/02/2018 1714 by Ronna Polio, RN Outcome: Progressing 07/02/2018 1413 by Myles Rosenthal, Cory Roughen, RN Outcome: Progressing   Problem: Skin Integrity: Goal: Risk for impaired skin integrity will decrease 07/02/2018 1714 by Ronna Polio, RN Outcome: Progressing 07/02/2018 1413 by Ronna Polio, RN Outcome: Progressing

## 2018-07-02 NOTE — Progress Notes (Signed)
[facility] Hospital Day(s): 4.   Post op day(s):  Marland Kitchen   Interval History: Patient seen and examined, no acute events or new complaints overnight. Patient reports feeling better today, denies nausea or vomiting. Passing gas, no bowel movement.   Vital signs in last 24 hours: [min-max] current  Temp:  [97.4 F (36.3 C)-99 F (37.2 C)] 98.1 F (36.7 C) (10/20 0938) Pulse Rate:  [47-58] 50 (10/20 0938) Resp:  [18] 18 (10/20 0938) BP: (138-160)/(52-62) 138/52 (10/20 0938) SpO2:  [95 %-98 %] 96 % (10/20 0938)     Height: 6' (182.9 cm) Weight: 100.2 kg BMI (Calculated): 29.97    Physical Exam:  Constitutional: alert, cooperative and no distress  Respiratory: breathing non-labored at rest  Cardiovascular: regular rate and sinus rhythm  Gastrointestinal: soft, non-tender, and non-distended  Labs:  CBC Latest Ref Rng & Units 06/30/2018 06/29/2018 06/28/2018  WBC 4.0 - 10.5 K/uL 8.6 10.7(H) 8.4  Hemoglobin 13.0 - 17.0 g/dL 13.4 16.1 16.2  Hematocrit 39.0 - 52.0 % 42.4 49.5 50.1  Platelets 150 - 400 K/uL 100(L) 133(L) 135(L)   CMP Latest Ref Rng & Units 07/02/2018 07/01/2018 06/30/2018  Glucose 70 - 99 mg/dL 130(H) 75 86  BUN 8 - 23 mg/dL 26(H) 32(H) 44(H)  Creatinine 0.61 - 1.24 mg/dL 1.13 1.09 1.28(H)  Sodium 135 - 145 mmol/L 148(H) 148(H) 147(H)  Potassium 3.5 - 5.1 mmol/L 3.1(L) 3.7 3.4(L)  Chloride 98 - 111 mmol/L 109 111 105  CO2 22 - 32 mmol/L 29 22 32  Calcium 8.9 - 10.3 mg/dL 8.3(L) 8.5(L) 8.3(L)  Total Protein 6.5 - 8.1 g/dL - - -  Total Bilirubin 0.3 - 1.2 mg/dL - - -  Alkaline Phos 38 - 126 U/L - - -  AST 15 - 41 U/L - - -  ALT 0 - 44 U/L - - -    Imaging studies: No new pertinent imaging studies   Assessment/Plan:  84 y.o.malewithrecurrentcomplete small bowel obstruction, likely attributable to post-surgical adhesions followingRight colectomy, complicated bypossible aspiration pneumonia,AKIsuperimposed on CKD,mild leukocytosis, and  bypertinent comorbidities includingadvanced chronological age,HTN, HLD,andhistory of diverticulitis.  Patient feeling better today but still feels weak when trying to ambulate. Diet progressed to full liquid. If tolerated will advance to soft. Encourage to ambulate as tolerated. Possible discharge tomorrow if tolerates diet and ambulating well.   Arnold Long, MD

## 2018-07-02 NOTE — Progress Notes (Signed)
Pt ambulated with the nurse in the hallway.

## 2018-07-02 NOTE — Plan of Care (Signed)
The patient has ambulated in the hallway two laps around the unit. Tolerating a full liquid diet. Had a bowel movement today. Voiding. On room air. Pain has been under control.  Problem: Education: Goal: Knowledge of General Education information will improve Description Including pain rating scale, medication(s)/side effects and non-pharmacologic comfort measures Outcome: Progressing   Problem: Health Behavior/Discharge Planning: Goal: Ability to manage health-related needs will improve Outcome: Progressing   Problem: Clinical Measurements: Goal: Ability to maintain clinical measurements within normal limits will improve Outcome: Progressing Goal: Will remain free from infection Outcome: Progressing Goal: Diagnostic test results will improve Outcome: Progressing Goal: Respiratory complications will improve Outcome: Progressing Goal: Cardiovascular complication will be avoided Outcome: Progressing   Problem: Activity: Goal: Risk for activity intolerance will decrease Outcome: Progressing   Problem: Nutrition: Goal: Adequate nutrition will be maintained Outcome: Progressing   Problem: Coping: Goal: Level of anxiety will decrease Outcome: Progressing   Problem: Elimination: Goal: Will not experience complications related to bowel motility Outcome: Progressing Goal: Will not experience complications related to urinary retention Outcome: Progressing   Problem: Pain Managment: Goal: General experience of comfort will improve Outcome: Progressing   Problem: Safety: Goal: Ability to remain free from injury will improve Outcome: Progressing   Problem: Skin Integrity: Goal: Risk for impaired skin integrity will decrease Outcome: Progressing

## 2018-07-03 LAB — ECHOCARDIOGRAM COMPLETE
HEIGHTINCHES: 72 in
Weight: 3536 oz

## 2018-07-03 MED ORDER — AMOXICILLIN-POT CLAVULANATE 875-125 MG PO TABS: 1.0000 | ORAL_TABLET | Freq: Two times a day (BID) | ORAL | 0 refills | Status: AC

## 2018-07-03 NOTE — Discharge Summary (Addendum)
Patient seen and examined as described below with surgical PA-C, Ardell Isaacs.  I have personally reviewed the patient's chart, evaluated/examined the patient, proposed the recommended management, and discussed these recommendations with the patient to his expressed satisfaction as well as with patient's RN.  -- Marilynne Drivers. Rosana Hoes, MD, [place]: [facility] Partnering for exceptional care. Office: [phone]  Discharge Summary  Patient ID: Keith A Offer Sr. MRN: [id number] DOB/AGE: Mar 11, 1931 84 y.o.  Admit date: 06/28/2018 Discharge date: 07/03/2018  Discharge Diagnoses Small Bowel Obstruction Possible Aspiration PNA HTN BPH  Consultants Medicine  Procedures None  HPI: 84 y.o. male presented to [facility] ED on 06/28/18 for abdominal pain. Patient reports a 2-3 days history of worsening abdominal distension accompanied by worsening diffuse abdominal pain and 1 day of nausea and emesis. He reports a history of similar in the past in which he was diagnosed with a small bowel obstruction. He reports his last BM was on 10/14. No complaints of fever, chills, CP, SOB. Abdominal surgical history is significant for previous colon resection. In the ED his work up was significant for AKI and revealed a SBO with transition point in the LUQ most likely due to adhesions. He was admitted to general surgery with medicine on consult.   Hospital Course: Over the next 2 days the patient was kept NPO and his NGT remained in placed until 10/19. Patient's small bowel obstruction improved/resolvedclinically and on KUB and advancement of patient's diet and ambulation were well-tolerated. The remainder of patient's hospital course was essentially unremarkable, and discharge planning was initiated accordingly with patient safely able to be discharged home with appropriate discharge instructions and outpatient  follow-up after all of his questions were answered to his  expressed satisfaction.  Physical Examination:  Constitutional: alert, cooperative and no distress  HENT: normocephalic without obvious abnormality Respiratory: breathing non-labored at rest  Cardiovascular: regular rate and sinus rhythm  Gastrointestinal: soft, non-tender, and non-distended, well healed previous midline laparotomy incision  Allergies as of 07/03/2018   No Known Allergies     Medication List    TAKE these medications   amoxicillin-clavulanate 875-125 MG tablet Commonly known as:  AUGMENTIN Take 1 tablet by mouth every 12 (twelve) hours for 7 days.   aspirin 81 MG tablet Take 81 mg by mouth daily as needed for pain.   atorvastatin 20 MG tablet Commonly known as:  LIPITOR Take 20 mg by mouth at bedtime.   CENTRUM SILVER PO Take 1 tablet by mouth daily.   CIALIS 20 MG tablet Generic drug:  tadalafil Take by mouth.   diltiazem 240 MG 24 hr capsule Commonly known as:  CARDIZEM CD TAKE 1 CAPSULE ONCE DAILY   hydrochlorothiazide 25 MG tablet Commonly known as:  HYDRODIURIL Take 12.5 mg by mouth daily.   Magnesium 400 MG Tabs Take 400 mg by mouth daily.   MAGNESIUM CARBONATE PO Take by mouth.   omeprazole 20 MG capsule Commonly known as:  PRILOSEC TAKE 1 CAPSULE ONCE DAILY   ondansetron 4 MG disintegrating tablet Commonly known as:  ZOFRAN-ODT Take by mouth.   oxybutynin 5 MG 24 hr tablet Commonly known as:  DITROPAN-XL Take 5 mg by mouth daily.   POTASSIUM PO Take 100 mg by mouth daily.   Quinine Sulfate Powd Take 1 Dose by mouth as needed.   tamsulosin 0.4 MG Caps capsule Commonly known as:  FLOMAX Take 0.4 mg by mouth daily.   verapamil 240 MG CR tablet Commonly known  as:  CALAN-SR Take 240 mg by mouth 2 (two) times daily.   vitamin B-12 1000 MCG tablet Commonly known as:  CYANOCOBALAMIN Take 1,000 mcg by mouth daily.      Follow-up Information    [facility]. Schedule an appointment as soon as possible for a  visit.   Why:  Make an appointment to follow up with your PCP following hospitalization for small bowel obstruction  Contact information: [place] [address] [phone]        Vickie Epley, MD Follow up.   Specialty:  General Surgery Why:  Follow up as needed Contact information: [address] [place] [address] [phone]          Signed: Edison Simon , PA-C [facility]  07/03/2018, 9:15 AM [phone] M-F: 7am - 4pm

## 2018-07-03 NOTE — Care Management Important Message (Signed)
Copy of signed IM left with patient in room.  

## 2018-07-03 NOTE — Care Management (Signed)
Patient for discharge home today.  Firmly declines home health. Notified Otho Ket.  Patient's wife to transport hoe. Denies issues obtaining medications.

## 2018-07-03 NOTE — Progress Notes (Signed)
07/03/2018 11:34 AM  Keith A Shanley Sr. to be D/C'd Home per MD order.  Discussed prescriptions and follow up appointments with the patient. Prescriptions given to patient, medication list explained in detail. Pt verbalized understanding.  Allergies as of 07/03/2018   No Known Allergies     Medication List    TAKE these medications   amoxicillin-clavulanate 875-125 MG tablet Commonly known as:  AUGMENTIN Take 1 tablet by mouth every 12 (twelve) hours for 7 days.   aspirin 81 MG tablet Take 81 mg by mouth daily as needed for pain.   atorvastatin 20 MG tablet Commonly known as:  LIPITOR Take 20 mg by mouth at bedtime.   CENTRUM SILVER PO Take 1 tablet by mouth daily.   CIALIS 20 MG tablet Generic drug:  tadalafil Take by mouth.   diltiazem 240 MG 24 hr capsule Commonly known as:  CARDIZEM CD TAKE 1 CAPSULE ONCE DAILY   hydrochlorothiazide 25 MG tablet Commonly known as:  HYDRODIURIL Take 12.5 mg by mouth daily.   Magnesium 400 MG Tabs Take 400 mg by mouth daily.   MAGNESIUM CARBONATE PO Take by mouth.   omeprazole 20 MG capsule Commonly known as:  PRILOSEC TAKE 1 CAPSULE ONCE DAILY   ondansetron 4 MG disintegrating tablet Commonly known as:  ZOFRAN-ODT Take by mouth.   oxybutynin 5 MG 24 hr tablet Commonly known as:  DITROPAN-XL Take 5 mg by mouth daily.   POTASSIUM PO Take 100 mg by mouth daily.   Quinine Sulfate Powd Take 1 Dose by mouth as needed.   tamsulosin 0.4 MG Caps capsule Commonly known as:  FLOMAX Take 0.4 mg by mouth daily.   verapamil 240 MG CR tablet Commonly known as:  CALAN-SR Take 240 mg by mouth 2 (two) times daily.   vitamin B-12 1000 MCG tablet Commonly known as:  CYANOCOBALAMIN Take 1,000 mcg by mouth daily.       Vitals:   07/03/18 0442 07/03/18 1055  BP: (!) 144/72 129/66  Pulse: 73 68  Resp: 20   Temp: 97.9 F (36.6 C)   SpO2: 98% 97%    Skin clean, dry and intact without evidence of skin break down, no  evidence of skin tears noted. IV catheter discontinued intact. Site without signs and symptoms of complications. Dressing and pressure applied. Pt denies pain at this time. No complaints noted.  An After Visit Summary was printed and given to the patient. Patient escorted via [place], and D/C home via private auto.  Fuller Mandril, RN

## 2018-07-04 LAB — CULTURE, BLOOD (ROUTINE X 2)
CULTURE: NO GROWTH
Culture: NO GROWTH
SPECIAL REQUESTS: ADEQUATE
Special Requests: ADEQUATE

## 2018-07-06 ENCOUNTER — Inpatient Hospital Stay: Payer: Self-pay | Admitting: Surgery

## 2018-09-30 ENCOUNTER — Emergency Department
Admission: EM | Admit: 2018-09-30 | Discharge: 2018-09-30 | Disposition: A | Discharge status: Home / Self Care | Payer: Medicare Other | Attending: Emergency Medicine | Admitting: Emergency Medicine

## 2018-09-30 ENCOUNTER — Encounter: Payer: Self-pay | Admitting: Emergency Medicine

## 2018-09-30 ENCOUNTER — Other Ambulatory Visit: Payer: Self-pay

## 2018-09-30 DIAGNOSIS — I10 Essential (primary) hypertension: Secondary | ICD-10-CM | POA: Diagnosis not present

## 2018-09-30 DIAGNOSIS — Z85828 Personal history of other malignant neoplasm of skin: Secondary | ICD-10-CM | POA: Insufficient documentation

## 2018-09-30 DIAGNOSIS — Z79899 Other long term (current) drug therapy: Secondary | ICD-10-CM | POA: Insufficient documentation

## 2018-09-30 DIAGNOSIS — Z87891 Personal history of nicotine dependence: Secondary | ICD-10-CM | POA: Insufficient documentation

## 2018-09-30 DIAGNOSIS — R195 Other fecal abnormalities: Secondary | ICD-10-CM | POA: Diagnosis present

## 2018-09-30 DIAGNOSIS — K625 Hemorrhage of anus and rectum: Secondary | ICD-10-CM | POA: Insufficient documentation

## 2018-09-30 LAB — CBC
HEMATOCRIT: 37.3 % — AB (ref 39.0–52.0)
Hemoglobin: 12 g/dL — ABNORMAL LOW (ref 13.0–17.0)
MCH: 29.6 pg (ref 26.0–34.0)
MCHC: 32.2 g/dL (ref 30.0–36.0)
MCV: 92.1 fL (ref 80.0–100.0)
NRBC: 0 % (ref 0.0–0.2)
Platelets: 116 10*3/uL — ABNORMAL LOW (ref 150–400)
RBC: 4.05 MIL/uL — ABNORMAL LOW (ref 4.22–5.81)
RDW: 14.2 % (ref 11.5–15.5)
WBC: 7.8 10*3/uL (ref 4.0–10.5)

## 2018-09-30 LAB — COMPREHENSIVE METABOLIC PANEL
ALT: 11 U/L (ref 0–44)
AST: 19 U/L (ref 15–41)
Albumin: 3.4 g/dL — ABNORMAL LOW (ref 3.5–5.0)
Alkaline Phosphatase: 31 U/L — ABNORMAL LOW (ref 38–126)
Anion gap: 6 (ref 5–15)
BILIRUBIN TOTAL: 0.9 mg/dL (ref 0.3–1.2)
BUN: 24 mg/dL — ABNORMAL HIGH (ref 8–23)
CALCIUM: 8.4 mg/dL — AB (ref 8.9–10.3)
CHLORIDE: 106 mmol/L (ref 98–111)
CO2: 28 mmol/L (ref 22–32)
CREATININE: 1.2 mg/dL (ref 0.61–1.24)
GFR, EST NON AFRICAN AMERICAN: 55 mL/min — AB (ref >60)
Glucose, Bld: 138 mg/dL — ABNORMAL HIGH (ref 70–99)
Potassium: 3.7 mmol/L (ref 3.5–5.1)
Sodium: 140 mmol/L (ref 135–145)
TOTAL PROTEIN: 6.2 g/dL — AB (ref 6.5–8.1)

## 2018-09-30 LAB — HEMOGLOBIN AND HEMATOCRIT, BLOOD
HCT: 39.7 % (ref 39.0–52.0)
Hemoglobin: 12.7 g/dL — ABNORMAL LOW (ref 13.0–17.0)

## 2018-09-30 LAB — TYPE AND SCREEN
ABO/RH(D): A NEG
Antibody Screen: NEGATIVE

## 2018-09-30 NOTE — ED Triage Notes (Signed)
Rectal bleeding x 3 days 

## 2018-09-30 NOTE — ED Provider Notes (Signed)
[facility] Emergency Department Provider Note   ____________________________________________   First MD Initiated Contact with Patient 09/30/18 [phone]     (approximate)  I have reviewed the triage vital signs and the nursing notes.   HISTORY  Chief Complaint Rectal Bleeding    HPI Keith A Grilli Sr. is an 85 y.o. male presents for evaluation of blood in his stool for about 3 days  Patient reports he seen a bit of bleeding when he had a bowel movement over the last 3 days.  No nausea vomiting.  No abdominal pain.  Reports he had something similar with hemorrhoids, but he has not seen any hemorrhoids.  He notices his stool seems to be normal in color, but then will see red in the toilet when he is done wipes right off with toilet paper.  No fever chills.  No abdominal pain.  Ports he is otherwise been feeling well.  No chest pain no lightheadedness or weakness.  Does not take any blood thinner, only will occasionally take a baby aspirin if he has a headache does not take aspirin every day.  His wife reports he does have a history of diverticulosis and had a bowel obstruction recently, but he is eating well not having any pain or vomiting.  Past Medical History:  Diagnosis Date  . Diverticulitis   . Hypercholesteremia   . Hypertension   . Lower extremity edema   . Skin cancer     Patient Active Problem List   Diagnosis Date Noted  . Aspiration pneumonia ([place]) 06/29/2018  . Intestinal adhesions with complete obstruction ([place]) 06/28/2018  . Acalculous cholecystitis   . Abdominal pain 03/14/2016  . Hypokalemia   . Blood glucose elevated 03/06/2016  . HLD (hyperlipidemia) 03/06/2016  . BP (high blood pressure) 03/06/2016  . Small bowel obstruction ([place]) 03/06/2016  . AKI (acute kidney injury) ([place])   . Uncontrollable vomiting   . Edema leg 05/02/2015  . Chronic gouty arthritis 11/08/2014    Past Surgical History:  Procedure Laterality Date  .  CATARACT EXTRACTION W/PHACO Right 09/18/2015   Procedure: CATARACT EXTRACTION PHACO AND INTRAOCULAR LENS PLACEMENT (IOC);  Surgeon: Leandrew Koyanagi, MD;  Location: [facility] ORS;  Service: Ophthalmology;  Laterality: Right;  US00:57.4 AP12.5 CDE7.15 FLUID LOT [id number]  . CATARACT EXTRACTION W/PHACO Left 10/16/2015   Procedure: CATARACT EXTRACTION PHACO AND INTRAOCULAR LENS PLACEMENT (IOC);  Surgeon: Leandrew Koyanagi, MD;  Location: [facility] ORS;  Service: Ophthalmology;  Laterality: Left;  [place]   1:02.9 AP%  12.5 CDE    8.81 casette lot # [id number]   exp 02/10/2017  . COLON RESECTION    . COLONOSCOPY    . HERNIA REPAIR    . TONSILLECTOMY      Prior to Admission medications   Medication Sig Start Date End Date Taking? Authorizing Provider  diltiazem (CARDIZEM CD) 240 MG 24 hr capsule TAKE 1 CAPSULE ONCE DAILY 11/16/17  Yes [provider]  hydrochlorothiazide (HYDRODIURIL) 25 MG tablet Take 12.5 mg by mouth daily.    Yes [provider]  Magnesium 400 MG TABS Take 400 mg by mouth daily.    Yes [provider]  Multiple Vitamins-Minerals (CENTRUM SILVER PO) Take 1 tablet by mouth daily.   Yes [provider]  omeprazole (PRILOSEC) 20 MG capsule TAKE 1 CAPSULE ONCE DAILY 10/31/17  Yes [provider]  oxybutynin (DITROPAN-XL) 5 MG 24 hr tablet Take 5 mg by mouth daily.    Yes [provider]  tamsulosin (FLOMAX) 0.4 MG CAPS capsule Take 0.4 mg by mouth daily.   Yes [provider]  vitamin B-12 (CYANOCOBALAMIN) 1000 MCG tablet Take 1,000 mcg by mouth daily.   Yes [provider]    Allergies Patient has no known allergies.  Family History  Problem Relation Age of Onset  . Heart disease Father   . Cancer Father   . Cancer Sister     Social History Social History   Tobacco Use  . Smoking status: Former Smoker    Last attempt to quit: 03/29/1972    Years since quitting: 46.5  . Smokeless tobacco: Never Used  Substance  Use Topics  . 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 Drug use: No    Review of Systems Constitutional: No fever/chills Eyes: No visual changes. ENT: No sore throat. Cardiovascular: Denies chest pain. Respiratory: Denies shortness of breath. Gastrointestinal: See HPI.  Reports has not had a bowel movement today.  Yesterday and the day before he had 1 bowel movement with blood in it each day. Genitourinary: Negative for dysuria. Musculoskeletal: Negative for back pain. Skin: Negative for rash. Neurological: Negative for headaches, areas of focal weakness or numbness.    ____________________________________________   PHYSICAL EXAM:  VITAL SIGNS: ED Triage Vitals  Enc Vitals Group     BP 09/30/18 0938 (!) 144/62     Pulse Rate 09/30/18 0938 68     Resp 09/30/18 0938 18     Temp 09/30/18 0938 97.7 F (36.5 C)     Temp Source 09/30/18 0938 Oral     SpO2 09/30/18 0938 94 %     Weight 09/30/18 0939 224 lb (101.6 kg)     Height 09/30/18 0939 6' (1.829 m)     Head Circumference --      Peak Flow --      Pain Score 09/30/18 0939 0     Pain Loc --      Pain Edu? --      Excl. in [place]? --     Constitutional: Alert and oriented. Well appearing and in no acute distress. Eyes: Conjunctivae are normal. Head: Atraumatic. Nose: No congestion/rhinnorhea. Mouth/Throat: Mucous membranes are moist. Neck: No stridor.  Cardiovascular: Normal rate, regular rhythm. Grossly normal heart sounds.  Good peripheral circulation. Respiratory: Normal respiratory effort.  No retractions. Lungs CTAB.  Rectal exam performed with nurse Anderson Malta.  Patient is a small amount of dried blood over the perineum, very small amount.  He has no active bleeding or blood in the rectal vault.  Small amount of brown stool is slightly heme positive.  No evidence of large or ongoing bleeding by rectal exam at this time. Gastrointestinal: Soft and nontender. No distention. Musculoskeletal:  No lower extremity tenderness nor edema. Neurologic:  Normal speech and language. No gross focal neurologic deficits are appreciated.  Skin:  Skin is warm, dry and intact. No rash noted. Psychiatric: Mood and affect are normal. Speech and behavior are normal.  ____________________________________________   LABS (all labs ordered are listed, but only abnormal results are displayed)  Labs Reviewed  COMPREHENSIVE METABOLIC PANEL - Abnormal; Notable for the following components:      Result Value   Glucose, Bld 138 (*)    BUN 24 (*)    Calcium 8.4 (*)    Total Protein 6.2 (*)    Albumin 3.4 (*)    Alkaline Phosphatase 31 (*)    GFR calc non Af  Amer 55 (*)    All other components within normal limits  CBC - Abnormal; Notable for the following components:   RBC 4.05 (*)    Hemoglobin 12.0 (*)    HCT 37.3 (*)    Platelets 116 (*)    All other components within normal limits  HEMOGLOBIN AND HEMATOCRIT, BLOOD - Abnormal; Notable for the following components:   Hemoglobin 12.7 (*)    All other components within normal limits  POC OCCULT BLOOD, ED  TYPE AND SCREEN   ____________________________________________  EKG   ____________________________________________  RADIOLOGY   ____________________________________________   PROCEDURES  Procedure(s) performed: None  Procedures  Critical Care performed: No  ____________________________________________   INITIAL IMPRESSION / ASSESSMENT AND PLAN / ED COURSE  Pertinent labs & imaging results that were available during my care of the patient were reviewed by me and considered in my medical decision making (see chart for details).   Painless rectal bleeding.  No symptoms of obstruction.  No abdominal pain on exam.  No distention.  Denies pain. history of diverticulosis, partial colectomy in the past as well.  No external hemorrhoids noted today, could potentially be internal hemorrhoid the patient does not report a large amount  of bleeding or take any anticoagulants.  He appears nontoxic, no distress, normal hemodynamics.  Given the patient denies any associated pain, fever, nausea or vomiting or obstructive symptoms do not believe imaging is needed at this time.  Will check hemoglobin, if this is stable and he is not have any further notable bleeding in the ER think it would be most appropriate to refer him to close follow-up with his primary care and GI, I would suspect this may be potentially internal hemorrhoid or potentially small amount of diverticular etiology though host of other etiologies are also considered the need close follow-up.  Clinical Course as of Sep 30 1450  Sat Sep 30, 2018  1146 Patient resting comfortably.  Agreeable with plan to observe for a couple more hours in the ER, recheck hemoglobin to assess for stability and observe for ongoing bowel movement.  Presently fully alert no distress.   [MQ]  1243 Patient had formed brown bowel movement, small couple of drops of bright red blood. No evidence of major bleeding noted. Suspect possibly internal hemorrhoidal like bleeding.   [MQ]    Clinical Course User Index [MQ] Delman Kitten, MD   ----------------------------------------- 2:52 PM on 09/30/2018 -----------------------------------------  Patient ambulatory.  No concerns at present.  No ongoing bleeding.  Hemoglobin stable, no evidence of major GI bleeding.  Discussed careful return precautions and follow-up with primary physician and GI physician with patient and his family at the bedside including his wife.  Return precautions and treatment recommendations and follow-up discussed with the patient who is agreeable with the plan.   ____________________________________________   FINAL CLINICAL IMPRESSION(S) / ED DIAGNOSES  Final diagnoses:  Rectal bleeding        Note:  This document was prepared using Dragon voice recognition software and may include unintentional dictation  errors       Delman Kitten, MD 09/30/18 1453

## 2019-06-20 ENCOUNTER — Other Ambulatory Visit
Admission: RE | Admit: 2019-06-20 | Discharge: 2019-06-20 | Disposition: A | Discharge status: Home / Self Care | Payer: Medicare Other | Source: Ambulatory Visit | Attending: Infectious Diseases | Admitting: Infectious Diseases

## 2019-06-20 DIAGNOSIS — N3943 Post-void dribbling: Secondary | ICD-10-CM | POA: Insufficient documentation

## 2019-06-20 DIAGNOSIS — M7989 Other specified soft tissue disorders: Secondary | ICD-10-CM | POA: Insufficient documentation

## 2019-06-20 DIAGNOSIS — N3945 Continuous leakage: Secondary | ICD-10-CM | POA: Diagnosis not present

## 2019-06-20 LAB — BRAIN NATRIURETIC PEPTIDE: B Natriuretic Peptide: 1440 pg/mL — ABNORMAL HIGH (ref 0.0–100.0)

## 2019-07-25 ENCOUNTER — Encounter: Payer: Self-pay | Admitting: Urology

## 2019-07-25 ENCOUNTER — Ambulatory Visit: Payer: Self-pay | Admitting: Urology

## 2019-07-25 NOTE — Progress Notes (Deleted)
07/25/2019 9:54 AM   Keith A Graziosi Sr. [birth year] [id number]  Referring provider: Medicine, [facility] [address] [place],  [place] [address]  No chief complaint on file.   HPI:  He is currently on oxybutynin 5 mg XL once daily.  He also takes Flomax 0.4 mg daily.  He has a personal history of CHF on Lasix, history of CVA amongst others.  PMH: Past Medical History:  Diagnosis Date  . Diverticulitis   . Hypercholesteremia   . Hypertension   . Lower extremity edema   . Skin cancer     Surgical History: Past Surgical History:  Procedure Laterality Date  . CATARACT EXTRACTION W/PHACO Right 09/18/2015   Procedure: CATARACT EXTRACTION PHACO AND INTRAOCULAR LENS PLACEMENT (IOC);  Surgeon: Leandrew Koyanagi, MD;  Location: [facility] ORS;  Service: Ophthalmology;  Laterality: Right;  US00:57.4 AP12.5 CDE7.15 FLUID LOT [id number]  . CATARACT EXTRACTION W/PHACO Left 10/16/2015   Procedure: CATARACT EXTRACTION PHACO AND INTRAOCULAR LENS PLACEMENT (IOC);  Surgeon: Leandrew Koyanagi, MD;  Location: [facility] ORS;  Service: Ophthalmology;  Laterality: Left;  [place]   1:02.9 AP%  12.5 CDE    8.81 casette lot # [id number]   exp 02/10/2017  . COLON RESECTION    . COLONOSCOPY    . HERNIA REPAIR    . TONSILLECTOMY      Home Medications:  Allergies as of 07/25/2019   No Known Allergies     Medication List       Accurate as of July 25, 2019  9:54 AM. If you have any questions, ask your nurse or doctor.        CENTRUM SILVER PO Take 1 tablet by mouth daily.   diltiazem 240 MG 24 hr capsule Commonly known as: CARDIZEM CD TAKE 1 CAPSULE ONCE DAILY   hydrochlorothiazide 25 MG tablet Commonly known as: HYDRODIURIL Take 12.5 mg by mouth daily.   Magnesium 400 MG Tabs Take 400 mg by mouth daily.   omeprazole 20 MG capsule Commonly known as: PRILOSEC TAKE 1 CAPSULE ONCE DAILY   oxybutynin 5 MG 24 hr tablet Commonly known as: DITROPAN-XL Take 5 mg by mouth daily.   tamsulosin 0.4 MG Caps capsule Commonly known as: FLOMAX Take 0.4 mg by mouth daily.   vitamin B-12 1000 MCG tablet Commonly known as: CYANOCOBALAMIN Take 1,000 mcg by mouth daily.       Allergies: No Known Allergies  Family History: Family History  Problem Relation Age of Onset  . Heart disease Father   . Cancer Father   . Cancer Sister     Social History:  reports that he quit smoking about 47 years ago. He has never used smokeless tobacco. He reports that he does not drink alcohol or use drugs.  ROS:                                        Physical Exam: There were no vitals taken for this visit.  Constitutional:  Alert and oriented, No acute distress. HEENT: Gross AT, moist mucus membranes.  Trachea midline, no masses. Cardiovascular: No clubbing, cyanosis, or edema. Respiratory: Normal respiratory effort, no increased work of breathing. GI: Abdomen is soft, nontender, nondistended, no abdominal masses GU: No CVA tenderness Lymph: No cervical or inguinal lymphadenopathy. Skin: No rashes, bruises or suspicious lesions. Neurologic: Grossly intact, no focal deficits, moving all 4 extremities. Psychiatric: Normal mood and affect.  Laboratory Data: Lab Results  Component Value Date   WBC 7.8 09/30/2018   HGB 12.7 (L) 09/30/2018   HCT 39.7 09/30/2018   MCV 92.1 09/30/2018   PLT 116 (L) 09/30/2018    Lab Results  Component Value Date   CREATININE 1.20 09/30/2018    No results found for: PSA  No results found for: TESTOSTERONE  No results found for: HGBA1C  Urinalysis    Component Value Date/Time   COLORURINE YELLOW (A) 03/14/2016 1353   APPEARANCEUR CLEAR (A) 03/14/2016 1353   LABSPEC 1.025 03/14/2016 1353   PHURINE 5.0 03/14/2016 1353   GLUCOSEU NEGATIVE 03/14/2016 1353   HGBUR NEGATIVE 03/14/2016 1353   BILIRUBINUR NEGATIVE 03/14/2016 1353   KETONESUR 1+ (A) 03/14/2016 1353   PROTEINUR 30 (A) 03/14/2016 1353   NITRITE  NEGATIVE 03/14/2016 1353   LEUKOCYTESUR NEGATIVE 03/14/2016 1353    Lab Results  Component Value Date   BACTERIA NONE SEEN 03/14/2016    Pertinent Imaging: *** Results for orders placed during the hospital encounter of 06/28/18  DG Abd 1 View   Narrative CLINICAL DATA:  85 year old male with a history of prior ileus  EXAM: ABDOMEN - 1 VIEW  COMPARISON:  06/28/2018, CT 06/28/2018  FINDINGS: Gastric tube terminates within the left upper quadrant. The side port is near the GE junction.  No significant gastric distention with no significant gastric air. Small amount of small bowel and colonic air. No abnormal distention. Surgical clips within the right abdomen. Gas extends to the rectum.  IMPRESSION: No evidence of bowel obstruction.  Gastric tube terminates in the left upper quadrant with the side port near the GE junction.  Surgical changes of the right abdomen.   Electronically Signed   By: Corrie Mckusick D.O.   On: 07/01/2018 08:23    No results found for this or any previous visit. No results found for this or any previous visit. No results found for this or any previous visit. No results found for this or any previous visit. No results found for this or any previous visit. No results found for this or any previous visit. No results found for this or any previous visit.  Assessment & Plan:    There are no diagnoses linked to this encounter.  No follow-ups on file.  Hollice Espy, MD  [facility] [address], [place], [place] [address] [phone]

## 2019-09-12 ENCOUNTER — Encounter: Payer: Self-pay | Admitting: Urology

## 2019-09-12 ENCOUNTER — Ambulatory Visit (INDEPENDENT_AMBULATORY_CARE_PROVIDER_SITE_OTHER): Payer: Medicare Other | Admitting: Urology

## 2019-09-12 ENCOUNTER — Other Ambulatory Visit: Payer: Self-pay

## 2019-09-12 VITALS — BP 180/77 | HR 79 | Ht 72.0 in | Wt 217.0 lb

## 2019-09-12 DIAGNOSIS — R32 Unspecified urinary incontinence: Secondary | ICD-10-CM | POA: Diagnosis not present

## 2019-09-12 LAB — MICROSCOPIC EXAMINATION
Bacteria, UA: NONE SEEN
Epithelial Cells (non renal): NONE SEEN /hpf (ref 0–10)

## 2019-09-12 LAB — URINALYSIS, COMPLETE
Bilirubin, UA: NEGATIVE
Glucose, UA: NEGATIVE
Ketones, UA: NEGATIVE
Leukocytes,UA: NEGATIVE
Nitrite, UA: NEGATIVE
Protein,UA: NEGATIVE
Specific Gravity, UA: 1.02 (ref 1.005–1.030)
Urobilinogen, Ur: 0.2 mg/dL (ref 0.2–1.0)
pH, UA: 5.5 (ref 5.0–7.5)

## 2019-09-12 LAB — BLADDER SCAN AMB NON-IMAGING: Scan Result: 92

## 2019-09-12 NOTE — Progress Notes (Signed)
09/12/19 1:18 PM   Keith Bryant. 01-03-1931 [id number]  Referring provider: Medicine, [facility] [address] [place],  [place] [address]  CC: Urinary incontinence  HPI: I saw Keith Bryant in urology clinic for urinary incontinence.  He is a comorbid 85 year old male with medical history notable for hypertension, diverticulitis, small bowel obstruction, and congestive heart failure who reports about 1 year of urinary incontinence.  He is here with his wife today.  Both the the patient and his wife are extremely poor historians.  They state that he has had to wear 2 depends over the last year and he leaks primarily overnight.  It sounds like he may have some urgency associated with the leakage, but it is very difficult to understand his history.  It sounds like he also will have some leakage without being aware of it.  He is also on 40 mg Lasix daily for his lower extremity edema and congestive heart failure.  He has tried some over-the-counter prostate supplements, and he also takes oxybutynin 5 mg daily.  Flomax is on his medication list, but he reports he does not take this medication anymore.  He reports he urinates with a good stream during the day.  He denies any history of UTIs or urinary retention.  He denies any gross hematuria or flank pain.  He drinks soda and tea during the day.  Urinalysis is benign today with 0-5 WBCs, 0-2 RBCs, no bacteria, no yeast, nitrite negative.  PVR is normal at 90 mL.  PSA 0.48 in October 2020.  PMH: Past Medical History:  Diagnosis Date  . Diverticulitis   . Hypercholesteremia   . Hypertension   . Lower extremity edema   . Skin cancer     Surgical History: Past Surgical History:  Procedure Laterality Date  . CATARACT EXTRACTION W/PHACO Right 09/18/2015   Procedure: CATARACT EXTRACTION PHACO AND INTRAOCULAR LENS PLACEMENT (IOC);  Surgeon: Leandrew Koyanagi, MD;  Location: [facility] ORS;  Service: Ophthalmology;  Laterality: Right;   US00:57.4 AP12.5 CDE7.15 FLUID LOT [id number]  . CATARACT EXTRACTION W/PHACO Left 10/16/2015   Procedure: CATARACT EXTRACTION PHACO AND INTRAOCULAR LENS PLACEMENT (IOC);  Surgeon: Leandrew Koyanagi, MD;  Location: [facility] ORS;  Service: Ophthalmology;  Laterality: Left;  [place]   1:02.9 AP%  12.5 CDE    8.81 casette lot # [id number]   exp 02/10/2017  . COLON RESECTION    . COLONOSCOPY    . HERNIA REPAIR    . TONSILLECTOMY     Allergies: No Known Allergies  Family History: Family History  Problem Relation Age of Onset  . Heart disease Father   . Cancer Father   . Cancer Sister     Social History:  reports that he quit smoking about 47 years ago. He has never used smokeless tobacco. He reports that he does not drink alcohol or use drugs.  ROS: Please see flowsheet from today's date for complete review of systems.  Physical Exam: Ht 6' (1.829 m)   BMI 30.38 kg/m    Constitutional: Extremely frail appearing, in wheelchair Cardiovascular: No clubbing, cyanosis, or edema. Respiratory: Normal respiratory effort, no increased work of breathing. GI: Abdomen is soft, nontender, nondistended, no abdominal masses GU: Uncircumcised phallus without lesions, widely patent meatus Mild lower extremity edema  Laboratory Data: Reviewed, see HPI  Pertinent Imaging: I have personally reviewed the CT abdomen pelvis from October 2019.  There is no hydronephrosis or bladder distention.  Prostate measures 16 g.  Assessment & Plan:  In summary, the patient is an extremely comorbid and frail-appearing 85 year old male with over a year of urinary incontinence that sounds primarily urge in nature.  He has been on oxybutynin and Flomax in the past, and continues to take oxybutynin.  He is also on high-dose 40 mg Lasix daily.  I had a very frank conversation with the patient and his wife that urinary incontinence and elderly frail patients can be challenging to improve.  I recommended stopping the oxybutynin  as this medication is proven to cause confusion and falls in elderly patients.  He has no evidence of obstruction or overflow incontinence based on his prior CT or bladder scan today.    We discussed behavioral strategies at length including minimizing coffee, soda, and tea in the diet, minimizing fluids in the evening, timed and double voiding, and voiding prior to bed.  I also recommended taking his furosemide in the morning and not prior to bed.  I also recommended trial of Myrbetriq 50 mg daily to see if this improves some of his urgency and urge incontinence.  1 month of 50 mg samples were given today.  -Stop oxybutynin and Flomax -Virtual visit 1 month for symptom check, continue Myrbetriq at that time if improving -Other alternative would be a trial of a Cunningham clamp, but I doubt this will improve his primarily urge incontinence  A total of 45 minutes were spent face-to-face with the patient, greater than 50% was spent in patient education, counseling, and coordination of care regarding urinary incontinence and treatment options.   Billey Co, [facility] [address], [place], [place] [address] [phone]

## 2019-09-12 NOTE — Patient Instructions (Signed)
1. Stop oxybutynin and over the counter prostate medications 2. Avoid tea, soda, coffee, and alcohol 3. Try samples of mybetriq 50mg  daily to help with urgency and leakage 4. Virtual visit 4 weeks for symptom check

## 2019-10-07 ENCOUNTER — Emergency Department: Payer: Medicare Other

## 2019-10-07 ENCOUNTER — Encounter: Payer: Self-pay | Admitting: Internal Medicine

## 2019-10-07 ENCOUNTER — Inpatient Hospital Stay: Payer: Medicare Other

## 2019-10-07 ENCOUNTER — Inpatient Hospital Stay
Admission: EM | Admit: 2019-10-07 | Discharge: 2019-10-11 | DRG: 175 | Disposition: A | Discharge status: Hospice | Payer: Medicare Other | Attending: Family Medicine | Admitting: Family Medicine

## 2019-10-07 ENCOUNTER — Other Ambulatory Visit: Payer: Self-pay

## 2019-10-07 DIAGNOSIS — I82443 Acute embolism and thrombosis of tibial vein, bilateral: Secondary | ICD-10-CM | POA: Diagnosis present

## 2019-10-07 DIAGNOSIS — I248 Other forms of acute ischemic heart disease: Secondary | ICD-10-CM | POA: Diagnosis present

## 2019-10-07 DIAGNOSIS — I824Y3 Acute embolism and thrombosis of unspecified deep veins of proximal lower extremity, bilateral: Secondary | ICD-10-CM | POA: Diagnosis not present

## 2019-10-07 DIAGNOSIS — I2699 Other pulmonary embolism without acute cor pulmonale: Principal | ICD-10-CM | POA: Diagnosis present

## 2019-10-07 DIAGNOSIS — R451 Restlessness and agitation: Secondary | ICD-10-CM

## 2019-10-07 DIAGNOSIS — T17908A Unspecified foreign body in respiratory tract, part unspecified causing other injury, initial encounter: Secondary | ICD-10-CM

## 2019-10-07 DIAGNOSIS — J690 Pneumonitis due to inhalation of food and vomit: Secondary | ICD-10-CM | POA: Diagnosis present

## 2019-10-07 DIAGNOSIS — R531 Weakness: Secondary | ICD-10-CM

## 2019-10-07 DIAGNOSIS — R001 Bradycardia, unspecified: Secondary | ICD-10-CM

## 2019-10-07 DIAGNOSIS — R1310 Dysphagia, unspecified: Secondary | ICD-10-CM | POA: Diagnosis present

## 2019-10-07 DIAGNOSIS — I700 Atherosclerosis of aorta: Secondary | ICD-10-CM | POA: Diagnosis present

## 2019-10-07 DIAGNOSIS — R5381 Other malaise: Secondary | ICD-10-CM

## 2019-10-07 DIAGNOSIS — Z79899 Other long term (current) drug therapy: Secondary | ICD-10-CM

## 2019-10-07 DIAGNOSIS — I82411 Acute embolism and thrombosis of right femoral vein: Secondary | ICD-10-CM | POA: Diagnosis present

## 2019-10-07 DIAGNOSIS — R32 Unspecified urinary incontinence: Secondary | ICD-10-CM | POA: Diagnosis present

## 2019-10-07 DIAGNOSIS — I82453 Acute embolism and thrombosis of peroneal vein, bilateral: Secondary | ICD-10-CM | POA: Diagnosis present

## 2019-10-07 DIAGNOSIS — E785 Hyperlipidemia, unspecified: Secondary | ICD-10-CM | POA: Diagnosis present

## 2019-10-07 DIAGNOSIS — R627 Adult failure to thrive: Secondary | ICD-10-CM | POA: Diagnosis present

## 2019-10-07 DIAGNOSIS — I252 Old myocardial infarction: Secondary | ICD-10-CM

## 2019-10-07 DIAGNOSIS — I82433 Acute embolism and thrombosis of popliteal vein, bilateral: Secondary | ICD-10-CM | POA: Diagnosis present

## 2019-10-07 DIAGNOSIS — N400 Enlarged prostate without lower urinary tract symptoms: Secondary | ICD-10-CM | POA: Diagnosis present

## 2019-10-07 DIAGNOSIS — M6282 Rhabdomyolysis: Secondary | ICD-10-CM | POA: Diagnosis present

## 2019-10-07 DIAGNOSIS — E860 Dehydration: Secondary | ICD-10-CM | POA: Diagnosis present

## 2019-10-07 DIAGNOSIS — N1831 Chronic kidney disease, stage 3a: Secondary | ICD-10-CM | POA: Diagnosis not present

## 2019-10-07 DIAGNOSIS — N183 Chronic kidney disease, stage 3 unspecified: Secondary | ICD-10-CM | POA: Diagnosis present

## 2019-10-07 DIAGNOSIS — Z6826 Body mass index (BMI) 26.0-26.9, adult: Secondary | ICD-10-CM | POA: Diagnosis not present

## 2019-10-07 DIAGNOSIS — I130 Hypertensive heart and chronic kidney disease with heart failure and stage 1 through stage 4 chronic kidney disease, or unspecified chronic kidney disease: Secondary | ICD-10-CM | POA: Diagnosis present

## 2019-10-07 DIAGNOSIS — Z87891 Personal history of nicotine dependence: Secondary | ICD-10-CM

## 2019-10-07 DIAGNOSIS — R079 Chest pain, unspecified: Secondary | ICD-10-CM | POA: Diagnosis not present

## 2019-10-07 DIAGNOSIS — Z66 Do not resuscitate: Secondary | ICD-10-CM | POA: Diagnosis not present

## 2019-10-07 DIAGNOSIS — Z85828 Personal history of other malignant neoplasm of skin: Secondary | ICD-10-CM

## 2019-10-07 DIAGNOSIS — E43 Unspecified severe protein-calorie malnutrition: Secondary | ICD-10-CM | POA: Diagnosis present

## 2019-10-07 DIAGNOSIS — D696 Thrombocytopenia, unspecified: Secondary | ICD-10-CM | POA: Diagnosis present

## 2019-10-07 DIAGNOSIS — I5032 Chronic diastolic (congestive) heart failure: Secondary | ICD-10-CM

## 2019-10-07 DIAGNOSIS — Z20822 Contact with and (suspected) exposure to covid-19: Secondary | ICD-10-CM | POA: Diagnosis present

## 2019-10-07 DIAGNOSIS — Z8249 Family history of ischemic heart disease and other diseases of the circulatory system: Secondary | ICD-10-CM

## 2019-10-07 DIAGNOSIS — Z515 Encounter for palliative care: Secondary | ICD-10-CM

## 2019-10-07 DIAGNOSIS — Z809 Family history of malignant neoplasm, unspecified: Secondary | ICD-10-CM

## 2019-10-07 DIAGNOSIS — R778 Other specified abnormalities of plasma proteins: Secondary | ICD-10-CM | POA: Diagnosis not present

## 2019-10-07 DIAGNOSIS — I82403 Acute embolism and thrombosis of unspecified deep veins of lower extremity, bilateral: Secondary | ICD-10-CM | POA: Diagnosis not present

## 2019-10-07 LAB — CK: Total CK: 2536 U/L — ABNORMAL HIGH (ref 49–397)

## 2019-10-07 LAB — COMPREHENSIVE METABOLIC PANEL
ALT: 22 U/L (ref 0–44)
AST: 70 U/L — ABNORMAL HIGH (ref 15–41)
Albumin: 3.8 g/dL (ref 3.5–5.0)
Alkaline Phosphatase: 36 U/L — ABNORMAL LOW (ref 38–126)
Anion gap: 11 (ref 5–15)
BUN: 26 mg/dL — ABNORMAL HIGH (ref 8–23)
CO2: 28 mmol/L (ref 22–32)
Calcium: 9.4 mg/dL (ref 8.9–10.3)
Chloride: 103 mmol/L (ref 98–111)
Creatinine, Ser: 1.36 mg/dL — ABNORMAL HIGH (ref 0.61–1.24)
GFR calc Af Amer: 54 mL/min — ABNORMAL LOW (ref >60)
GFR calc non Af Amer: 47 mL/min — ABNORMAL LOW (ref >60)
Glucose, Bld: 138 mg/dL — ABNORMAL HIGH (ref 70–99)
Potassium: 3.9 mmol/L (ref 3.5–5.1)
Sodium: 142 mmol/L (ref 135–145)
Total Bilirubin: 1.5 mg/dL — ABNORMAL HIGH (ref 0.3–1.2)
Total Protein: 7.7 g/dL (ref 6.5–8.1)

## 2019-10-07 LAB — CBC WITH DIFFERENTIAL/PLATELET
Abs Immature Granulocytes: 0.03 10*3/uL (ref 0.00–0.07)
Basophils Absolute: 0.1 10*3/uL (ref 0.0–0.1)
Basophils Relative: 1 %
Eosinophils Absolute: 0.1 10*3/uL (ref 0.0–0.5)
Eosinophils Relative: 1 %
HCT: 48.3 % (ref 39.0–52.0)
Hemoglobin: 14.9 g/dL (ref 13.0–17.0)
Immature Granulocytes: 0 %
Lymphocytes Relative: 15 %
Lymphs Abs: 1.4 10*3/uL (ref 0.7–4.0)
MCH: 25.8 pg — ABNORMAL LOW (ref 26.0–34.0)
MCHC: 30.8 g/dL (ref 30.0–36.0)
MCV: 83.7 fL (ref 80.0–100.0)
Monocytes Absolute: 0.8 10*3/uL (ref 0.1–1.0)
Monocytes Relative: 8 %
Neutro Abs: 7.1 10*3/uL (ref 1.7–7.7)
Neutrophils Relative %: 75 %
Platelets: 93 10*3/uL — ABNORMAL LOW (ref 150–400)
RBC: 5.77 MIL/uL (ref 4.22–5.81)
RDW: 19 % — ABNORMAL HIGH (ref 11.5–15.5)
WBC: 9.5 10*3/uL (ref 4.0–10.5)
nRBC: 0 % (ref 0.0–0.2)

## 2019-10-07 LAB — URINALYSIS, COMPLETE (UACMP) WITH MICROSCOPIC
Bilirubin Urine: NEGATIVE
Glucose, UA: NEGATIVE mg/dL
Ketones, ur: NEGATIVE mg/dL
Leukocytes,Ua: NEGATIVE
Nitrite: NEGATIVE
Protein, ur: 100 mg/dL — AB
Specific Gravity, Urine: 1.025 (ref 1.005–1.030)
Squamous Epithelial / HPF: NONE SEEN (ref 0–5)
pH: 5 (ref 5.0–8.0)

## 2019-10-07 LAB — VITAMIN B12: Vitamin B-12: 871 pg/mL (ref 180–914)

## 2019-10-07 LAB — TSH: TSH: 2.341 u[IU]/mL (ref 0.350–4.500)

## 2019-10-07 LAB — FIBRIN DERIVATIVES D-DIMER (ARMC ONLY): Fibrin derivatives D-dimer (ARMC): >10000 ng/mL (FEU) — ABNORMAL HIGH (ref 0.00–499.00)

## 2019-10-07 LAB — RESPIRATORY PANEL BY RT PCR (FLU A&B, COVID)
Influenza A by PCR: NEGATIVE
Influenza B by PCR: NEGATIVE
SARS Coronavirus 2 by RT PCR: NEGATIVE

## 2019-10-07 LAB — BRAIN NATRIURETIC PEPTIDE: B Natriuretic Peptide: 1312 pg/mL — ABNORMAL HIGH (ref 0.0–100.0)

## 2019-10-07 LAB — TROPONIN I (HIGH SENSITIVITY)
Troponin I (High Sensitivity): 39 ng/L — ABNORMAL HIGH (ref <18)
Troponin I (High Sensitivity): 46 ng/L — ABNORMAL HIGH (ref <18)
Troponin I (High Sensitivity): 37 ng/L — ABNORMAL HIGH (ref <18)

## 2019-10-07 LAB — LACTIC ACID, PLASMA: Lactic Acid, Venous: 1.5 mmol/L (ref 0.5–1.9)

## 2019-10-07 MED ORDER — ONDANSETRON HCL 4 MG PO TABS: 4.0000 mg | ORAL_TABLET | Freq: Four times a day (QID) | ORAL | Status: DC | PRN

## 2019-10-07 MED ORDER — ACETAMINOPHEN 325 MG PO TABS: 650.0000 mg | ORAL_TABLET | Freq: Four times a day (QID) | ORAL | Status: DC | PRN

## 2019-10-07 MED ORDER — SODIUM CHLORIDE 0.9 % IV SOLN: INTRAVENOUS | Status: DC

## 2019-10-07 MED ORDER — HEPARIN BOLUS VIA INFUSION
6000.0000 [IU] | Freq: Once | INTRAVENOUS | Status: AC
  Administered 2019-10-08: 01:00:00 6000 [IU] via INTRAVENOUS
  Filled 2019-10-07: qty 6000

## 2019-10-07 MED ORDER — IOHEXOL 350 MG/ML SOLN
75.0000 mL | Freq: Once | INTRAVENOUS | Status: AC | PRN
  Administered 2019-10-07: 75 mL via INTRAVENOUS

## 2019-10-07 MED ORDER — HEPARIN (PORCINE) 25000 UT/250ML-% IV SOLN
1500.0000 [IU]/h | INTRAVENOUS | Status: DC
  Administered 2019-10-08: 1500 [IU]/h via INTRAVENOUS
  Filled 2019-10-07: qty 250

## 2019-10-07 MED ORDER — HYDROCODONE-ACETAMINOPHEN 5-325 MG PO TABS: 1.0000 | ORAL_TABLET | ORAL | Status: DC | PRN

## 2019-10-07 MED ORDER — ONDANSETRON HCL 4 MG/2ML IJ SOLN: 4.0000 mg | Freq: Four times a day (QID) | INTRAMUSCULAR | Status: DC | PRN

## 2019-10-07 MED ORDER — SODIUM CHLORIDE 0.9 % IV SOLN: Freq: Once | INTRAVENOUS | Status: DC

## 2019-10-07 MED ORDER — ACETAMINOPHEN 650 MG RE SUPP: 650.0000 mg | Freq: Four times a day (QID) | RECTAL | Status: DC | PRN

## 2019-10-07 NOTE — ED Triage Notes (Signed)
Pt arrives via EMS from home after having not moved from his couch since yesterday- EMS was called out for a public assist yesterday to help him onto the couch- pt was found to be in the same spot today soaked in urine- pt urine has foul smell- pt left eye noted to be red and slightly swollen

## 2019-10-07 NOTE — Progress Notes (Signed)
Reds Reading 26% on 10/07/19

## 2019-10-07 NOTE — Progress Notes (Signed)
ANTICOAGULATION CONSULT NOTE - Initial Consult  Pharmacy Consult for Heparin Indication: pulmonary embolus  No Known Allergies  Patient Measurements: Height: 6' (182.9 cm) Weight: 198 lb 12.8 oz (90.2 kg) IBW/kg (Calculated) : 77.6   Vital Signs: Temp: 98.6 F (37 C) (01/24 2111) Temp Source: Oral (01/24 2111) BP: 152/61 (01/24 2111) Pulse Rate: 48 (01/24 2111)  Labs: Recent Labs    10/07/19 1646 10/07/19 1946  HGB 14.9  --   HCT 48.3  --   PLT 93*  --   CREATININE 1.36*  --   CKTOTAL 2,536*  --   TROPONINIHS 39* 46*    Estimated Creatinine Clearance: 42.8 mL/min (A) (by C-G formula based on SCr of 1.36 mg/dL (H)).   Medical History: Past Medical History:  Diagnosis Date  . Diverticulitis   . Hypercholesteremia   . Hypertension   . Lower extremity edema   . Skin cancer     Medications:  Medications Prior to Admission  Medication Sig Dispense Refill Last Dose  . ammonium lactate (LAC-HYDRIN) 12 % lotion Apply 1 application topically 2 (two) times daily.     Marland Kitchen atorvastatin (LIPITOR) 20 MG tablet Take 20 mg by mouth daily.   24+ hours at Unknown  . cholecalciferol (VITAMIN D3) 25 MCG (1000 UNIT) tablet Take 1,000 Units by mouth daily.     . Cyanocobalamin (VITAMIN B-12) 500 MCG LOZG Take 1,000 mcg by mouth daily.      Marland Kitchen diltiazem (CARDIZEM CD) 240 MG 24 hr capsule Take 240 mg by mouth daily.    10/07/2019 at 0800  . ferrous sulfate 325 (65 FE) MG tablet Take 325 mg by mouth daily with breakfast.     . furosemide (LASIX) 40 MG tablet Take 40 mg by mouth daily.   24+ hours at Unknown  . Magnesium 400 MG TABS Take 400 mg by mouth daily.      . Multiple Vitamins-Minerals (CENTRUM SILVER PO) Take 1 tablet by mouth daily.     Marland Kitchen omeprazole (PRILOSEC) 20 MG capsule Take 20 mg by mouth daily.    24+ hours at Unknown  . oxybutynin (DITROPAN-XL) 5 MG 24 hr tablet Take 5 mg by mouth daily.    24+ hours at Unknown  . potassium chloride SA (KLOR-CON) 20 MEQ tablet Take 20  mEq by mouth daily.   24+ hours at Unknown  . tamsulosin (FLOMAX) 0.4 MG CAPS capsule Take 0.4 mg by mouth daily.   24+ hours at Unknown    Assessment: No anticoagulants listed on PTA med list.  Baseline labs ordered.  Pharmacy asked to initiate Heparin for PE. Plts < 100K, pt has h/o chronic thrombocytopenia.    Goal of Therapy:  Heparin level 0.3-0.7 units/ml Monitor platelets by anticoagulation protocol: Yes   Plan:  Heparin 6000 units bolus x 1 then infusion at 1500 units/hr, check HL in 8 hours Monitor for s/sx bleeding complications  Hart Robinsons A 10/07/2019,11:16 PM

## 2019-10-07 NOTE — ED Notes (Addendum)
Pt had saturated and urinated through 2 depends with foul smell before arriving- pt cleansed of urine and place in clean brief

## 2019-10-07 NOTE — H&P (Addendum)
Keith Bryant [id number] DOB: 16-Aug-1931 DOA: 10/07/2019     PCP: Leonel Ramsay, MD   Outpatient Specialists:  CARDS:   [facility]   Urology Dr. Dorette Grate  Patient arrived to ER on 10/07/19 at 1632  Patient coming from: home Lives With family    Chief Complaint:   Chief Complaint  Patient presents with  . Weakness    HPI: Keith Bryant Sr. is a 86 y.o. male with medical history significant of diverticulitis, hyperlipidemia, hypertension, aspiration pneumonia, urinary incontinence, SBO, diastolic CHF    Presented with   generalized weakness apparently yesterday EMS was called out to his house because he could not ambulate.  They got him up and set him in his arm chair.  They checked on him again in 24 hours he remained in the same position and has urinated on himself but was unable to get up to go to the bathroom. At that point he was brought to the ER. Gradual onset o generalized weakness up up to last week was still able to walk  But for the past 3 days could not even sit up straight Per wife he has been very weak in general for the past 3 days he has severe generalized fatigue he has been falling back down in to a chair every time he tried to get up a few times and last night the paramedics had to come pick him up.   Pt has hx of urinary incontinence to wear 2 depends for the past year and often leaks. He is taking Lasix on daily basis and has contributed to his urinary incontinence. He used to be taking Flomax but unsure if he still takes it anymore. He has been seen by urology in end of December and was recommended to try Myrbetriq and if that does not work he may need Cunningham clamp but this is also unlikely to help him. Patient started on Myrbetriq and it seems to have contributed to his decline  He has chronic leg edema due to valvular CHF on Lasix Family denies fever, but over the past week he had no appetite and had chills, every time he eats he  starts to cough, no nausea no vomiting He have not had any abdominal pain, he has been using stool softener He have had some loose stool few days ago but wife thinks its related to taking stool softener. Wife reports runny nose but it has been going on for a while. They had no visitors Their son lives the groceries for them But patient has also been grocery shopping too.    Infectious risk factors:  Reports   dry cough,   URI symptoms,loss of appetite  Diarrhea/abdominal pain,  severe fatigue    In  ER  PCRCOVID TEST  NEGATIVE     Lab Results  Component Value Date   [place] NEGATIVE 10/07/2019     Regarding pertinent Chronic problems:    Hyperlipidemia -  on statins Lipitor   HTN on Cardizem Last echogram was in 2019 showed preserved EF    CKD stage III - baseline Cr 1.2 Lab Results  Component Value Date   CREATININE 1.36 (H) 10/07/2019   CREATININE 1.20 09/30/2018   CREATININE 1.13 07/02/2018     BPH - on Flomax,        While in ER: He was found to have CK elevated at 2536 troponin of 39 great toes down to 93 CT head unremarkable but chest  x-ray showed persistent bibasilar airspace disease   The following Work up has been ordered so far:  Orders Placed This Encounter  Procedures  . Respiratory Panel by RT PCR (Flu A&B, Covid) - Nasopharyngeal Swab  . CT Head Wo Contrast  . DG Chest 1 View  . CBC with Differential  . Comprehensive metabolic panel  . Urinalysis, Complete w Microscopic  . CK  . Cardiac monitoring  . Consult to hospitalist  ALL PATIENTS BEING ADMITTED/HAVING PROCEDURES NEED COVID-19 SCREENING  . Pulse oximetry, continuous  . ED EKG  . EKG 12-Lead  . Insert peripheral IV     Following Medications were ordered in ER: Medications  0.9 %  sodium chloride infusion (has no administration in time range)        Consult Orders  (From admission, onward)         Start     Ordered   10/07/19 1801  Consult to hospitalist  ALL PATIENTS  BEING ADMITTED/HAVING PROCEDURES NEED COVID-19 SCREENING  Once    Comments: ALL PATIENTS BEING ADMITTED/HAVING PROCEDURES NEED COVID-19 SCREENING  Provider:  (Not yet assigned)  Question Answer Comment  Place call to: [phone]   Reason for Consult Admit   Diagnosis/Clinical Info for Consult: weakness, rhabdomyolysis      10/07/19 1800          Significant initial  Findings: Abnormal Labs Reviewed  CBC WITH DIFFERENTIAL/PLATELET - Abnormal; Notable for the following components:      Result Value   MCH 25.8 (*)    RDW 19.0 (*)    Platelets 93 (*)    All other components within normal limits  COMPREHENSIVE METABOLIC PANEL - Abnormal; Notable for the following components:   Glucose, Bld 138 (*)    BUN 26 (*)    Creatinine, Ser 1.36 (*)    AST 70 (*)    Alkaline Phosphatase 36 (*)    Total Bilirubin 1.5 (*)    GFR calc non Af Amer 47 (*)    GFR calc Af Amer 54 (*)    All other components within normal limits  URINALYSIS, COMPLETE (UACMP) WITH MICROSCOPIC - Abnormal; Notable for the following components:   Color, Urine AMBER (*)    APPearance HAZY (*)    Hgb urine dipstick SMALL (*)    Protein, ur 100 (*)    Bacteria, UA FEW (*)    All other components within normal limits  CK - Abnormal; Notable for the following components:   Total CK 2,536 (*)    All other components within normal limits  TROPONIN I (HIGH SENSITIVITY) - Abnormal; Notable for the following components:   Troponin I (High Sensitivity) 39 (*)    All other components within normal limits    Otherwise labs showing:     Recent Labs  Lab 10/07/19 1646  NA 142  K 3.9  CO2 28  GLUCOSE 138*  BUN 26*  CREATININE 1.36*  CALCIUM 9.4    Cr  Up from baseline see below Lab Results  Component Value Date   CREATININE 1.36 (H) 10/07/2019   CREATININE 1.20 09/30/2018   CREATININE 1.13 07/02/2018    Recent Labs  Lab 10/07/19 1646  AST 70*  ALT 22  ALKPHOS 36*  BILITOT 1.5*  PROT 7.7  ALBUMIN 3.8    Lab Results  Component Value Date   CALCIUM 9.4 10/07/2019     WBC      Component Value Date/Time   WBC 9.5 10/07/2019 1646  ANC    Component Value Date/Time   NEUTROABS 7.1 10/07/2019 1646   ALC No components found for: LYMPHAB    Plt: Lab Results  Component Value Date   PLT 93 (L) 10/07/2019    Lactic Acid, Venous No results found for: LATICACIDVEN    COVID-19 Labs  No results for input(s): DDIMER, FERRITIN, LDH, CRP in the last 72 hours.  Lab Results  Component Value Date   [place] NEGATIVE 10/07/2019      HG/HCT  stable,       Component Value Date/Time   HGB 14.9 10/07/2019 1646   HCT 48.3 10/07/2019 1646     Troponin 39 Cardiac Panel (last 3 results) Recent Labs    10/07/19 1646  CKTOTAL 2,536*     ECG: Ordered Personally reviewed by me showing: HR : 61 Rhythm:   A.fib.  Vs   wondering atrial rhythm   no evidence of ischemic changes QTC 461   BNP (last 3 results) Recent Labs    06/20/19 1559  BNP 1,440.0*       UA   no evidence of UTI    Urine analysis:    Component Value Date/Time   COLORURINE AMBER (A) 10/07/2019 1719   APPEARANCEUR HAZY (A) 10/07/2019 1719   APPEARANCEUR Clear 09/12/2019 1311   LABSPEC 1.025 10/07/2019 1719   PHURINE 5.0 10/07/2019 1719   GLUCOSEU NEGATIVE 10/07/2019 1719   HGBUR SMALL (A) 10/07/2019 1719   BILIRUBINUR NEGATIVE 10/07/2019 1719   BILIRUBINUR Negative 09/12/2019 1311   KETONESUR NEGATIVE 10/07/2019 1719   PROTEINUR 100 (A) 10/07/2019 1719   NITRITE NEGATIVE 10/07/2019 1719   LEUKOCYTESUR NEGATIVE 10/07/2019 1719   Ordered  CT HEAD  NON acute  CXR -  NON acute    ED Triage Vitals  Enc Vitals Group     BP 10/07/19 1650 (!) 164/71     Pulse Rate 10/07/19 1642 81     Resp 10/07/19 1650 20     Temp 10/07/19 1650 (!) 97.5 F (36.4 C)     Temp Source 10/07/19 1650 Oral     SpO2 10/07/19 1642 98 %     Weight 10/07/19 1643 227 lb (103 kg)     Height 10/07/19 1643 6' (1.829 m)      Head Circumference --      Peak Flow --      Pain Score 10/07/19 1642 0     Pain Loc --      Pain Edu? --      Excl. in [place]? --   TMAX(24)@       Latest  Blood pressure (!) 160/56, pulse 77, temperature (!) 97.5 F (36.4 C), temperature source Oral, resp. rate (!) 26, height 6' (1.829 m), weight 103 kg, SpO2 97 %.    Hospitalist was called for admission for rhabdomyalisis   Review of Systems:    Pertinent positives include: chills, fatigue,  productive cough, Constitutional:  No weight loss, night sweats, Fevers, weight loss  HEENT:  No headaches, Difficulty swallowing,Tooth/dental problems,Sore throat,  No sneezing, itching, ear ache, nasal congestion, post nasal drip,  Cardio-vascular:  No chest pain, Orthopnea, PND, anasarca, dizziness, palpitations.no Bilateral lower extremity swelling  GI:  No heartburn, indigestion, abdominal pain, nausea, vomiting, diarrhea, change in bowel habits, loss of appetite, melena, blood in stool, hematemesis Resp:  no shortness of breath at rest. No dyspnea on exertion, No excess mucus, no  No non-productive cough, No coughing up of blood.No change in color of  mucus.No wheezing. Skin:  no rash or lesions. No jaundice GU:  no dysuria, change in color of urine, no urgency or frequency. No straining to urinate.  No flank pain.  Musculoskeletal:  No joint pain or no joint swelling. No decreased range of motion. No back pain.  Psych:  No change in mood or affect. No depression or anxiety. No memory loss.  Neuro: no localizing neurological complaints, no tingling, no weakness, no double vision, no gait abnormality, no slurred speech, no confusion  All systems reviewed and apart from [place] all are negative  Past Medical History:   Past Medical History:  Diagnosis Date  . Diverticulitis   . Hypercholesteremia   . Hypertension   . Lower extremity edema   . Skin cancer       Past Surgical History:  Procedure Laterality Date  . CATARACT  EXTRACTION W/PHACO Right 09/18/2015   Procedure: CATARACT EXTRACTION PHACO AND INTRAOCULAR LENS PLACEMENT (IOC);  Surgeon: Leandrew Koyanagi, MD;  Location: [facility] ORS;  Service: Ophthalmology;  Laterality: Right;  US00:57.4 AP12.5 CDE7.15 FLUID LOT [id number] H  . CATARACT EXTRACTION W/PHACO Left 10/16/2015   Procedure: CATARACT EXTRACTION PHACO AND INTRAOCULAR LENS PLACEMENT (IOC);  Surgeon: Leandrew Koyanagi, MD;  Location: [facility] ORS;  Service: Ophthalmology;  Laterality: Left;  [place]   1:02.9 AP%  12.5 CDE    8.81 casette lot # [id number]   exp 02/10/2017  . COLON RESECTION    . COLONOSCOPY    . HERNIA REPAIR    . TONSILLECTOMY      Social History:  Ambulatory with walker       reports that he quit smoking about 47 years ago. He has never used smokeless tobacco. He reports that he does not drink alcohol or use drugs.   Family History:   Family History  Problem Relation Age of Onset  . Heart disease Father   . Cancer Father   . Cancer Sister     Allergies: No Known Allergies   Prior to Admission medications   Medication Sig Start Date End Date Taking? Authorizing Provider  diltiazem (CARDIZEM CD) 240 MG 24 hr capsule TAKE 1 CAPSULE ONCE DAILY 11/16/17   [provider]  hydrochlorothiazide (HYDRODIURIL) 25 MG tablet Take 12.5 mg by mouth daily.     [provider]  Magnesium 400 MG TABS Take 400 mg by mouth daily.     [provider]  Multiple Vitamins-Minerals (CENTRUM SILVER PO) Take 1 tablet by mouth daily.    [provider]  omeprazole (PRILOSEC) 20 MG capsule TAKE 1 CAPSULE ONCE DAILY 10/31/17   [provider]  oxybutynin (DITROPAN-XL) 5 MG 24 hr tablet Take 5 mg by mouth daily.     [provider]  vitamin B-12 (CYANOCOBALAMIN) 1000 MCG tablet Take 1,000 mcg by mouth daily.    [provider]   Physical Exam: Blood pressure (!) 160/56, pulse 77, temperature (!) 97.5 F (36.4 C), temperature source Oral, resp.  rate (!) 26, height 6' (1.829 m), weight 103 kg, SpO2 97 %. 1. General:  in No  Acute distress   Chronically ill -appearing 2. Psychological: Alert and Oriented 3. Head/ENT:   Dry Mucous Membranes                          Head Non traumatic, neck supple  Poor Dentition 4. SKIN:  decreased Skin turgor,  Skin clean Dry and intact no rash 5. Heart: Regular rate and rhythm no  Murmur, no Rub or gallop 6. Lungs:   no wheezes or crackles   7. Abdomen: Soft,  non-tender, Non distended  bowel sounds present 8. Lower extremities: no clubbing, cyanosis trace edema 9. Neurologically diminished strength throughout but more in the lower extremities with left being somewhat more affected than the right.  Cranial nerves II through XII intact, reflexes appear to be normal 10. MSK: Normal range of motion   All other LABS:     Recent Labs  Lab 10/07/19 1646  WBC 9.5  NEUTROABS 7.1  HGB 14.9  HCT 48.3  MCV 83.7  PLT 93*     Recent Labs  Lab 10/07/19 1646  NA 142  K 3.9  CL 103  CO2 28  GLUCOSE 138*  BUN 26*  CREATININE 1.36*  CALCIUM 9.4     Recent Labs  Lab 10/07/19 1646  AST 70*  ALT 22  ALKPHOS 36*  BILITOT 1.5*  PROT 7.7  ALBUMIN 3.8      Cultures:    Component Value Date/Time   SDES BLOOD LEFT ANTECUBITAL 06/29/2018 0115   SDES BLOOD BLOOD LEFT HAND 06/29/2018 0115   SPECREQUEST  06/29/2018 0115    BOTTLES DRAWN AEROBIC AND ANAEROBIC Blood Culture adequate volume   SPECREQUEST  06/29/2018 0115    BOTTLES DRAWN AEROBIC AND ANAEROBIC Blood Culture adequate volume   CULT  06/29/2018 0115    NO GROWTH 5 DAYS Performed at [facility], [address], [place], [place] [address]    CULT  06/29/2018 0115    NO GROWTH 5 DAYS Performed at [facility], [address] [place], [place] [address]    REPTSTATUS 07/04/2018 FINAL 06/29/2018 0115   REPTSTATUS 07/04/2018 FINAL 06/29/2018 0115     Radiological Exams on  Admission: DG Chest 1 View  Result Date: 10/07/2019 CLINICAL DATA:  Weakness. EXAM: CHEST  1 VIEW COMPARISON:  07/01/2018 FINDINGS: Heart size is borderline enlarged. Aortic calcifications are noted. There is a persistent left-sided pleural effusion with bibasilar airspace disease. Vascular congestion without overt pulmonary edema is noted. There is no acute osseous abnormality. There is no pneumothorax. IMPRESSION: 1. Cardiomegaly with mild vascular congestion. 2. Persistent bibasilar airspace disease with a small left-sided pleural effusion. 3. Aortic atherosclerosis is noted. Electronically Signed   By: Constance Holster M.D.   On: 10/07/2019 16:49   CT Head Wo Contrast  Result Date: 10/07/2019 CLINICAL DATA:  RIGHT eye swelling and redness, found on couch in the same spot as yesterday, soaked with urine, encephalopathy, hypertension EXAM: CT HEAD WITHOUT CONTRAST TECHNIQUE: Contiguous axial images were obtained from the base of the skull through the vertex without intravenous contrast. Sagittal and coronal MPR images reconstructed from axial data set. COMPARISON:  07/20/2011 FINDINGS: Brain: Generalized atrophy. Normal ventricular morphology. No midline shift or mass effect. Small vessel chronic ischemic changes of deep cerebral white matter. No intracranial hemorrhage, mass lesion or evidence of acute infarction. No extra-axial fluid collections. Vascular: No hyperdense vessels Skull: Intact Sinuses/Orbits: Clear Other: N/A IMPRESSION: Atrophy with small vessel chronic ischemic changes of deep cerebral white matter. No acute intracranial abnormalities. Electronically Signed   By: Lavonia Dana M.D.   On: 10/07/2019 17:38    Chart has been reviewed    Assessment/Plan   86 y.o. male with medical history significant of diverticulitis, hyperlipidemia, hypertension, aspiration pneumonia, urinary  incontinence, SBO, diastolic CHF  Admitted for rhabdomyolysis, elevated troponin generalized  debility  Present on Admission: . Rhabdomyolysis - will gently rehydrate hold lasix, repeat CK in AM  . HLD (hyperlipidemia) - hold Lipitor given elevated CK    . CKD (chronic kidney disease), stage III - avoid for nephrotoxic medications monitor renal function  History of diastolic/valvular CHF. - - currently appears to be slightly on the dry side, hold home diuretics for tonight and restart when appears euvolemic, carefuly follow fluid status and Cr, repeat echo  Noted to be intermittently bradycardic in emergency department Check TSH.  EKG reads as A. fib but after further evaluation seems to be underlying P waves patient is on diltiazem.  Will hold for tonight and resume as able to tolerate appreciate cardiology consult  . Urinary incontinence-chronic for the past 6 months family feels that Myrbetriq has made situation worse will hold off for now. Evaluate for any evidence of urinary retention Which could have caused overflow incontinence. Given progressive lower extremity weakness relatively new incontinence may benefit from lumbar MRI if able to tolerate   . Debility -acute on chronic in the setting of rhabdomyolysis.  We will gently rehydrate will need PT OT assessment.  Obtain MRI brain tonight given somewhat localizing exam will probably need further evaluation by neurology to see if there is any underlying neurological condition given deterioration over past 6 months with what appears to be recurrent aspiration events lower extremity progressive weakness urinary incontinence. Check B12 level, TSH.  No evidence of normal pressure hydrocephalus on CT  . Dehydration gently rehydrated hold Lasix for tonight   Thrombocytopenia  Chronic but a bit worse today cont to follow, may need hematology eval as an outpatient  . Elevated troponin -  -no chest pain no EKG changes in the setting of  chronic kidney disease likely due to demand ischemia and poor clearance, monitor on telemetry and  cycle cardiac enzymes to trend. Obtain echo  if continues to rise will need further work-up  Dysphagia vs aspiration - will have Speech pathology evaluate, need modified barium swallow study as patient has history of gastric contents found in his esophagus  Abnormal chest x-ray -this is persistent finding, no evidence of acute pneumonia at this time no white blood cell count fever.  Patient is at risk for recurrent aspiration Will likely benefit from further imaging to clarify if there is underlying pulmonary pathology.  Suspect chronic changes from recurrent aspiration hold off on antibiotics for tonight  Other plan as per orders.  DVT prophylaxis:  SCD     Code Status:  FULL CODE  as per   family  I had personally discussed CODE STATUS with  Family   Family Communication:   Family not at  Bedside  plan of care was discussed on the phone with  Wife,   Disposition Plan:    likely will need placement for rehabilitation                                         Would benefit from PT/OT eval prior to DC  Ordered                   Swallow eval - SLP ordered                   Social Work  consulted  Nutrition    consulted                                    Consults called:   May need Neurology consult pending MRI findings Notified cardiology  Admission status:  ED Disposition    ED Disposition Condition Comment   Admit  The patient appears reasonably stabilized for admission considering the current resources, flow, and capabilities available in the ED at this time, and I doubt any other [facility] requiring further screening and/or treatment in the ED prior to admission is  present.         inpatient     Expect 2 midnight stay secondary to severity of patient's current illness including   hemodynamic instability despite optimal treatment (intermittent bradycardia)  Severe lab/radiological/exam abnormalities including:  Elevated CK   and extensive comorbidities including:    CHF   That are currently affecting medical management.   I expect  patient to be hospitalized for 2 midnights requiring inpatient medical care.  Patient is at high risk for adverse outcome (such as loss of life or disability) if not treated.  Indication for inpatient stay as follows:  Severe change from baseline regarding mental status Hemodynamic instability despite maximal medical therapy,    inability to maintain oral hydration     Need for  IV fluids     Level of care    tele  For 24H    Precautions: admitted as  Covid Negative  No active isolations   PPE: Used by the provider:   P100  eye Goggles,  Gloves   Shireen Rayburn 10/07/2019, 8:14 PM    [facility]     after 2 AM please page floor coverage PA If 7AM-7PM, please contact the day team taking care of the patient using [email or web address]   Patient was evaluated in the context of the global COVID-19 pandemic, which necessitated consideration that the patient might be at risk for infection with the SARS-CoV-2 virus that causes COVID-19. Institutional protocols and algorithms that pertain to the evaluation of patients at risk for COVID-19 are in a state of rapid change based on information released by regulatory bodies including the CDC and federal and state organizations. These policies and algorithms were followed during the patient's care.

## 2019-10-07 NOTE — Progress Notes (Signed)
Patient in mri

## 2019-10-07 NOTE — ED Notes (Signed)
ED TO INPATIENT HANDOFF REPORT  ED Nurse Name and Phone #: Balinda Quails Name/Age/Gender Keith A Wilber Sr. 86 y.o. male Room/Bed: ED05A/ED05A  Code Status   Code Status: Prior  Home/SNF/Other TBD Patient oriented to: self and situation Is this baseline? no  Triage Complete: Triage complete  Chief Complaint Rhabdomyolysis [M62.82]  Triage Note Pt arrives via EMS from home after having not moved from his couch since yesterday- EMS was called out for a public assist yesterday to help him onto the couch- pt was found to be in the same spot today soaked in urine- pt urine has foul smell- pt left eye noted to be red and slightly swollen    Allergies No Known Allergies  Level of Care/Admitting Diagnosis ED Disposition    ED Disposition Condition Comment   [place]: [place] [100120]  Level of Care: Telemetry [5]  Covid Evaluation: Symptomatic Person Under Investigation (PUI)  Diagnosis: Rhabdomyolysis [728.88.ICD-9-CM]  Admitting Physician: Toy Baker [[id number]]  Attending Physician: Toy Baker [[id number]]  Estimated length of stay: 3 - 4 days  Certification:: I certify this patient will need inpatient services for at least 2 midnights       B Medical/Surgery History Past Medical History:  Diagnosis Date  . Diverticulitis   . Hypercholesteremia   . Hypertension   . Lower extremity edema   . Skin cancer    Past Surgical History:  Procedure Laterality Date  . CATARACT EXTRACTION W/PHACO Right 09/18/2015   Procedure: CATARACT EXTRACTION PHACO AND INTRAOCULAR LENS PLACEMENT (IOC);  Surgeon: Leandrew Koyanagi, MD;  Location: [facility] ORS;  Service: Ophthalmology;  Laterality: Right;  US00:57.4 AP12.5 CDE7.15 FLUID LOT [id number]  . CATARACT EXTRACTION W/PHACO Left 10/16/2015   Procedure: CATARACT EXTRACTION PHACO AND INTRAOCULAR LENS PLACEMENT (IOC);  Surgeon: Leandrew Koyanagi, MD;  Location: [facility] ORS;  Service: Ophthalmology;   Laterality: Left;  [place]   1:02.9 AP%  12.5 CDE    8.81 casette lot # [id number]   exp 02/10/2017  . COLON RESECTION    . COLONOSCOPY    . HERNIA REPAIR    . TONSILLECTOMY       A IV Location/Drains/Wounds Patient Lines/Drains/Airways Status   Active Line/Drains/Airways    Name:   Placement date:   Placement time:   Site:   Days:   Peripheral IV 10/07/19 Right Antecubital   10/07/19    1654    Antecubital   less than 1          Intake/Output Last 24 hours No intake or output data in the 24 hours ending 10/07/19 2002  Labs/Imaging Results for orders placed or performed during the hospital encounter of 10/07/19 (from the past 48 hour(s))  CBC with Differential     Status: Abnormal   Collection Time: 10/07/19  4:46 PM  Result Value Ref Range   WBC 9.5 4.0 - 10.5 K/uL   RBC 5.77 4.22 - 5.81 MIL/uL   Hemoglobin 14.9 13.0 - 17.0 g/dL   HCT 48.3 39.0 - 52.0 %   MCV 83.7 80.0 - 100.0 fL   MCH 25.8 (L) 26.0 - 34.0 pg   MCHC 30.8 30.0 - 36.0 g/dL   RDW 19.0 (H) 11.5 - 15.5 %   Platelets 93 (L) 150 - 400 K/uL    Comment: Immature Platelet Fraction may be clinically indicated, consider ordering this additional test [id number]    nRBC 0.0 0.0 - 0.2 %   Neutrophils Relative % 75 %   Neutro  Abs 7.1 1.7 - 7.7 K/uL   Lymphocytes Relative 15 %   Lymphs Abs 1.4 0.7 - 4.0 K/uL   Monocytes Relative 8 %   Monocytes Absolute 0.8 0.1 - 1.0 K/uL   Eosinophils Relative 1 %   Eosinophils Absolute 0.1 0.0 - 0.5 K/uL   Basophils Relative 1 %   Basophils Absolute 0.1 0.0 - 0.1 K/uL   Immature Granulocytes 0 %   Abs Immature Granulocytes 0.03 0.00 - 0.07 K/uL    Comment: Performed at [facility], [address] [place], [place] [address]  Comprehensive metabolic panel     Status: Abnormal   Collection Time: 10/07/19  4:46 PM  Result Value Ref Range   Sodium 142 135 - 145 mmol/L   Potassium 3.9 3.5 - 5.1 mmol/L   Chloride 103 98 - 111 mmol/L   CO2 28 22 - 32 mmol/L   Glucose, Bld  138 (H) 70 - 99 mg/dL   BUN 26 (H) 8 - 23 mg/dL   Creatinine, Ser 1.36 (H) 0.61 - 1.24 mg/dL   Calcium 9.4 8.9 - 10.3 mg/dL   Total Protein 7.7 6.5 - 8.1 g/dL   Albumin 3.8 3.5 - 5.0 g/dL   AST 70 (H) 15 - 41 U/L   ALT 22 0 - 44 U/L   Alkaline Phosphatase 36 (L) 38 - 126 U/L   Total Bilirubin 1.5 (H) 0.3 - 1.2 mg/dL   GFR calc non Af Amer 47 (L) >60 mL/min   GFR calc Af Amer 54 (L) >60 mL/min   Anion gap 11 5 - 15    Comment: Performed at [facility], [address] [place], [place] [address]  CK     Status: Abnormal   Collection Time: 10/07/19  4:46 PM  Result Value Ref Range   Total CK 2,536 (H) 49 - 397 U/L    Comment: Performed at [facility], [place], [place] [address]  Troponin I (High Sensitivity)     Status: Abnormal   Collection Time: 10/07/19  4:46 PM  Result Value Ref Range   Troponin I (High Sensitivity) 39 (H) <18 ng/L    Comment: (NOTE) Elevated high sensitivity troponin I (hsTnI) values and significant  changes across serial measurements may suggest ACS but many other  chronic and acute conditions are known to elevate hsTnI results.  Refer to the "Links" section for chest pain algorithms and additional  guidance. Performed at [facility], [address] [place], [place] [address]   Brain natriuretic peptide     Status: Abnormal   Collection Time: 10/07/19  4:46 PM  Result Value Ref Range   B Natriuretic Peptide 1,312.0 (H) 0.0 - 100.0 pg/mL    Comment: Performed at [facility], [address] [place], [place] [address]  TSH     Status: None   Collection Time: 10/07/19  4:46 PM  Result Value Ref Range   TSH 2.341 0.350 - 4.500 uIU/mL    Comment: Performed by a 3rd Generation assay with a functional sensitivity of <=0.01 uIU/mL. Performed at [facility], [address] [place], [place] [address]   Urinalysis, Complete w Microscopic     Status: Abnormal   Collection Time: 10/07/19  5:19  PM  Result Value Ref Range   Color, Urine AMBER (A) YELLOW    Comment: BIOCHEMICALS MAY BE AFFECTED BY COLOR   APPearance HAZY (A) CLEAR   Specific Gravity, Urine 1.025 1.005 - 1.030  pH 5.0 5.0 - 8.0   Glucose, UA NEGATIVE NEGATIVE mg/dL   Hgb urine dipstick SMALL (A) NEGATIVE   Bilirubin Urine NEGATIVE NEGATIVE   Ketones, ur NEGATIVE NEGATIVE mg/dL   Protein, ur 100 (A) NEGATIVE mg/dL   Nitrite NEGATIVE NEGATIVE   Leukocytes,Ua NEGATIVE NEGATIVE   RBC / HPF 0-5 0 - 5 RBC/hpf   WBC, UA 0-5 0 - 5 WBC/hpf   Bacteria, UA FEW (A) NONE SEEN   Squamous Epithelial / LPF NONE SEEN 0 - 5   Mucus PRESENT     Comment: Performed at [facility], [address], [place], [place] [address]  Respiratory Panel by RT PCR (Flu A&B, Covid) - Nasopharyngeal Swab     Status: None   Collection Time: 10/07/19  5:44 PM   Specimen: Nasopharyngeal Swab  Result Value Ref Range   SARS Coronavirus 2 by RT PCR NEGATIVE NEGATIVE    Comment: (NOTE) SARS-CoV-2 target nucleic acids are NOT DETECTED. The SARS-CoV-2 RNA is generally detectable in upper respiratoy specimens during the acute phase of infection. The lowest concentration of SARS-CoV-2 viral copies this assay can detect is 131 copies/mL. A negative result does not preclude SARS-Cov-2 infection and should not be used as the sole basis for treatment or other patient management decisions. A negative result may occur with  improper specimen collection/handling, submission of specimen other than nasopharyngeal swab, presence of viral mutation(s) within the areas targeted by this assay, and inadequate number of viral copies (<131 copies/mL). A negative result must be combined with clinical observations, patient history, and epidemiological information. The expected result is Negative. Fact Sheet for Patients:  PinkCheek.be Fact Sheet for Healthcare Providers:  GravelBags.it This test  is not yet ap proved or cleared by the [place] FDA and  has been authorized for detection and/or diagnosis of SARS-CoV-2 by FDA under an Emergency Use Authorization (EUA). This EUA will remain  in effect (meaning this test can be used) for the duration of the COVID-19 declaration under Section 564(b)(1) of the Act, 21 U.S.C. section 360bbb-3(b)(1), unless the authorization is terminated or revoked sooner.    Influenza A by PCR NEGATIVE NEGATIVE   Influenza B by PCR NEGATIVE NEGATIVE    Comment: (NOTE) The Xpert Xpress SARS-CoV-2/FLU/RSV assay is intended as an aid in  the diagnosis of influenza from Nasopharyngeal swab specimens and  should not be used as a sole basis for treatment. Nasal washings and  aspirates are unacceptable for Xpert Xpress SARS-CoV-2/FLU/RSV  testing. Fact Sheet for Patients: PinkCheek.be Fact Sheet for Healthcare Providers: GravelBags.it This test is not yet approved or cleared by the [place] FDA and  has been authorized for detection and/or diagnosis of SARS-CoV-2 by  FDA under an Emergency Use Authorization (EUA). This EUA will remain  in effect (meaning this test can be used) for the duration of the  Covid-19 declaration under Section 564(b)(1) of the Act, 21  U.S.C. section 360bbb-3(b)(1), unless the authorization is  terminated or revoked. Performed at [facility], [address] [place], [place] [address]    DG Chest 1 View  Result Date: 10/07/2019 CLINICAL DATA:  Weakness. EXAM: CHEST  1 VIEW COMPARISON:  07/01/2018 FINDINGS: Heart size is borderline enlarged. Aortic calcifications are noted. There is a persistent left-sided pleural effusion with bibasilar airspace disease. Vascular congestion without overt pulmonary edema is noted. There is no acute osseous abnormality. There is no pneumothorax. IMPRESSION: 1. Cardiomegaly with mild vascular congestion. 2. Persistent  bibasilar airspace disease  with a small left-sided pleural effusion. 3. Aortic atherosclerosis is noted. Electronically Signed   By: Constance Holster M.D.   On: 10/07/2019 16:49   CT Head Wo Contrast  Result Date: 10/07/2019 CLINICAL DATA:  RIGHT eye swelling and redness, found on couch in the same spot as yesterday, soaked with urine, encephalopathy, hypertension EXAM: CT HEAD WITHOUT CONTRAST TECHNIQUE: Contiguous axial images were obtained from the base of the skull through the vertex without intravenous contrast. Sagittal and coronal MPR images reconstructed from axial data set. COMPARISON:  07/20/2011 FINDINGS: Brain: Generalized atrophy. Normal ventricular morphology. No midline shift or mass effect. Small vessel chronic ischemic changes of deep cerebral white matter. No intracranial hemorrhage, mass lesion or evidence of acute infarction. No extra-axial fluid collections. Vascular: No hyperdense vessels Skull: Intact Sinuses/Orbits: Clear Other: N/A IMPRESSION: Atrophy with small vessel chronic ischemic changes of deep cerebral white matter. No acute intracranial abnormalities. Electronically Signed   By: Lavonia Dana M.D.   On: 10/07/2019 17:38    Pending Labs Unresulted Labs (From admission, onward)    Start     Ordered   10/08/19 0500  CK  Tomorrow morning,   STAT     10/07/19 1919   10/08/19 0500  Prealbumin  Tomorrow morning,   STAT     10/07/19 1947   10/07/19 1952  Fibrin derivatives D-Dimer ([place] only)  Add-on,   AD     10/07/19 1951   10/07/19 1840  Lactic acid, plasma  Once,   STAT     10/07/19 1839   10/07/19 1838  Respiratory Panel by RT PCR (Flu A&B, Covid) - Nasopharyngeal Swab  (Tier 2 Respiratory Panel by RT PCR (Flu A&B, Covid) (TAT 2 hrs))  Once,   STAT    Question Answer Comment  Is this test for diagnosis or screening Screening   Symptomatic for COVID-19 as defined by CDC No   Hospitalized for COVID-19 No   Admitted to ICU for COVID-19 No   Previously tested for  COVID-19 Yes   Resident in a congregate (group) care setting No   Employed in healthcare setting No      10/07/19 1837   Signed and Held  Magnesium  Tomorrow morning,   R    Comments: Call MD if <1.5    Signed and Held   Signed and Held  Phosphorus  Tomorrow morning,   R     Signed and Held   Signed and Held  TSH  Once,   R    Comments: Cancel if already done within 1 month and notify MD    Signed and Held   Signed and Held  Comprehensive metabolic panel  Once,   R    Comments: Cal MD for K<3.5 or >5.0    Signed and Held   Signed and Held  CBC  Once,   R    Comments: Call for hg <8.0    Signed and Held          Vitals/Pain Today's Vitals   10/07/19 1700 10/07/19 1800 10/07/19 1900 10/07/19 1930  BP: (!) 160/81 (!) 160/56 (!) 173/65 (!) 154/66  Pulse: 67 77 73 66  Resp: (!) 26   20  Temp:    97.8 F (36.6 C)  TempSrc:      SpO2: 98% 97% 97% 97%  Weight:      Height:      PainSc:        Isolation Precautions No active isolations  Medications Medications  0.9 %  sodium chloride infusion (has no administration in time range)    Mobility non-ambulatory High fall risk   Focused Assessments Neuro Assessment Handoff:  Swallow screen pass? n/a         Neuro Assessment:   Neuro Checks:      Last Documented NIHSS Modified Score:   Has TPA been given? No If patient is a Neuro Trauma and patient is going to OR before floor call report to [place] nurse: [phone] or [phone]     R Recommendations: See Admitting Provider Note  Report given to:   Additional Notes:

## 2019-10-07 NOTE — ED Provider Notes (Signed)
[facility] Emergency Department Provider Note       Time seen: ----------------------------------------- 4:34 PM on 10/07/2019 -----------------------------------------  I have reviewed the triage vital signs and the nursing notes. HISTORY   Chief Complaint No chief complaint on file.    HPI Keith A Anatoli Nola. is a 86 y.o. male with a history of diverticulitis, hyperlipidemia, hypertension, edema, pneumonia who presents to the ED for failure to thrive.  EMS was called to his house yesterday for an assist.  They helped him up and placed in a love seat.  When they returned today he had not moved.  The loveseat was saturated in urine and he had not moved in the last 24 hours.  Patient denies complaints, has generalized weakness.  Past Medical History:  Diagnosis Date  . Diverticulitis   . Hypercholesteremia   . Hypertension   . Lower extremity edema   . Skin cancer     Patient Active Problem List   Diagnosis Date Noted  . Aspiration pneumonia ([place]) 06/29/2018  . 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 Acalculous cholecystitis   . Abdominal pain 03/14/2016  . Hypokalemia   . Blood glucose elevated 03/06/2016  . HLD (hyperlipidemia) 03/06/2016  . BP (high blood pressure) 03/06/2016  . Small bowel obstruction ([place]) 03/06/2016  . AKI (acute kidney injury) ([place])   . Uncontrollable vomiting   . Edema leg 05/02/2015  . Chronic gouty arthritis 11/08/2014    Past Surgical History:  Procedure Laterality Date  . CATARACT EXTRACTION W/PHACO Right 09/18/2015   Procedure: CATARACT EXTRACTION PHACO AND INTRAOCULAR LENS PLACEMENT (IOC);  Surgeon: Leandrew Koyanagi, MD;  Location: [facility] ORS;  Service: Ophthalmology;  Laterality: Right;  US00:57.4 AP12.5 CDE7.15 FLUID LOT [id number]  . CATARACT EXTRACTION W/PHACO Left 10/16/2015   Procedure: CATARACT EXTRACTION PHACO AND INTRAOCULAR LENS PLACEMENT (IOC);  Surgeon: Leandrew Koyanagi, MD;   Location: [facility] ORS;  Service: Ophthalmology;  Laterality: Left;  [place]   1:02.9 AP%  12.5 CDE    8.81 casette lot # [id number] h   exp 02/10/2017  . COLON RESECTION    . COLONOSCOPY    . HERNIA REPAIR    . TONSILLECTOMY      Allergies Patient has no known allergies.  Social History Social History   Tobacco Use  . Smoking status: Former Smoker    Quit date: 03/29/1972    Years since quitting: 47.5  . Smokeless tobacco: Never Used  Substance Use Topics  . 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 Drug use: No   Review of Systems Constitutional: Negative for fever. Cardiovascular: Negative for chest pain. Respiratory: Negative for shortness of breath. Gastrointestinal: Negative for abdominal pain, vomiting and diarrhea. Musculoskeletal: Negative for back pain.  Positive for edema Skin: Negative for rash. Neurological: Positive for generalized weakness  All systems negative/normal/unremarkable except as stated in the HPI  ____________________________________________   PHYSICAL EXAM:  VITAL SIGNS: ED Triage Vitals  Enc Vitals Group     BP      Pulse      Resp      Temp      Temp src      SpO2      Weight      Height      Head Circumference      Peak Flow      Pain Score      Pain Loc      Pain Edu?  Excl. in [place]?     Constitutional: Alert, disheveled appearance, no distress Eyes: Conjunctivae are normal. Normal extraocular movements. ENT      Head: Normocephalic and atraumatic.      Nose: No congestion/rhinnorhea.      Mouth/Throat: Mucous membranes are moist.      Neck: No stridor. Cardiovascular: Normal rate, regular rhythm. No murmurs, rubs, or gallops. Respiratory: Normal respiratory effort without tachypnea nor retractions. Breath sounds are clear and equal bilaterally. No wheezes/rales/rhonchi. Gastrointestinal: Soft and nontender. Normal bowel sounds Musculoskeletal: Nontender with normal range of motion in extremities.   Pitting edema bilaterally Neurologic:  Normal speech and language. No gross focal neurologic deficits are appreciated.  Skin:  Skin is warm, dry and intact. No rash noted. Psychiatric: Mood and affect are normal. Speech and behavior are normal.  ____________________________________________  EKG: Interpreted by me.  Atrial fibrillation, rate of 61 bpm, low voltage, normal QT  ____________________________________________  ED COURSE:  As part of my medical decision making, I reviewed the following data within the [place] History obtained from family if available, nursing notes, old chart and ekg, as well as notes from prior ED visits. Patient presented for generalized weakness, we will assess with labs and imaging as indicated at this time.   Procedures  Keith A Milhouse Sr. was evaluated in Emergency Department on 10/07/2019 for the symptoms described in the history of present illness. He was evaluated in the context of the global COVID-19 pandemic, which necessitated consideration that the patient might be at risk for infection with the SARS-CoV-2 virus that causes COVID-19. Institutional protocols and algorithms that pertain to the evaluation of patients at risk for COVID-19 are in a state of rapid change based on information released by regulatory bodies including the CDC and federal and state organizations. These policies and algorithms were followed during the patient's care in the ED.  ____________________________________________   LABS (pertinent positives/negatives)  Labs Reviewed  CBC WITH DIFFERENTIAL/PLATELET - Abnormal; Notable for the following components:      Result Value   MCH 25.8 (*)    RDW 19.0 (*)    Platelets 93 (*)    All other components within normal limits  COMPREHENSIVE METABOLIC PANEL - Abnormal; Notable for the following components:   Glucose, Bld 138 (*)    BUN 26 (*)    Creatinine, Ser 1.36 (*)    AST 70 (*)    Alkaline Phosphatase 36 (*)     Total Bilirubin 1.5 (*)    GFR calc non Af Amer 47 (*)    GFR calc Af Amer 54 (*)    All other components within normal limits  URINALYSIS, COMPLETE (UACMP) WITH MICROSCOPIC - Abnormal; Notable for the following components:   Color, Urine AMBER (*)    APPearance HAZY (*)    Hgb urine dipstick SMALL (*)    Protein, ur 100 (*)    Bacteria, UA FEW (*)    All other components within normal limits  CK - Abnormal; Notable for the following components:   Total CK 2,536 (*)    All other components within normal limits  TROPONIN I (HIGH SENSITIVITY) - Abnormal; Notable for the following components:   Troponin I (High Sensitivity) 39 (*)    All other components within normal limits  RESPIRATORY PANEL BY RT PCR (FLU A&B, COVID)    RADIOLOGY Images were viewed by me  CT head, chest x-ray IMPRESSION: 1. Cardiomegaly with mild vascular congestion. 2. Persistent bibasilar airspace  disease with a small left-sided pleural effusion. 3. Aortic atherosclerosis is noted. IMPRESSION: Atrophy with small vessel chronic ischemic changes of deep cerebral white matter.  No acute intracranial abnormalities. ____________________________________________   DIFFERENTIAL DIAGNOSIS   Occult infection, general debility, dehydration, electrolyte abnormality, MI, CVA  FINAL ASSESSMENT AND PLAN  Weakness, rhabdomyolysis   Plan: The patient had presented for generalized weakness and failure to thrive. Patient's labs do indicate mild rhabdomyolysis with a CK level of 2500. Patient's imaging is not revealed any acute process, he has some cardiomegaly and pleural effusion.  Patient has significant weakness, will need to have a physical therapy consultation.  Have put him on gentle saline because of his history of heart failure.  I will discuss with the hospitalist for admission.   Laurence Aly, MD    Note: This note was generated in part or whole with voice recognition software. Voice  recognition is usually quite accurate but there are transcription errors that can and very often do occur. I apologize for any typographical errors that were not detected and corrected.     Earleen Newport, MD 10/07/19 [phone]

## 2019-10-08 ENCOUNTER — Encounter: Payer: Self-pay | Admitting: Internal Medicine

## 2019-10-08 ENCOUNTER — Inpatient Hospital Stay
Admit: 2019-10-08 | Discharge: 2019-10-08 | Disposition: A | Discharge status: Home / Self Care | Payer: Medicare Other | Attending: Internal Medicine | Admitting: Internal Medicine

## 2019-10-08 ENCOUNTER — Inpatient Hospital Stay: Payer: Medicare Other

## 2019-10-08 DIAGNOSIS — I2699 Other pulmonary embolism without acute cor pulmonale: Principal | ICD-10-CM

## 2019-10-08 DIAGNOSIS — I5032 Chronic diastolic (congestive) heart failure: Secondary | ICD-10-CM

## 2019-10-08 DIAGNOSIS — R001 Bradycardia, unspecified: Secondary | ICD-10-CM

## 2019-10-08 DIAGNOSIS — E43 Unspecified severe protein-calorie malnutrition: Secondary | ICD-10-CM | POA: Insufficient documentation

## 2019-10-08 DIAGNOSIS — D696 Thrombocytopenia, unspecified: Secondary | ICD-10-CM

## 2019-10-08 DIAGNOSIS — I82403 Acute embolism and thrombosis of unspecified deep veins of lower extremity, bilateral: Secondary | ICD-10-CM

## 2019-10-08 DIAGNOSIS — R079 Chest pain, unspecified: Secondary | ICD-10-CM

## 2019-10-08 LAB — HEPARIN LEVEL (UNFRACTIONATED): Heparin Unfractionated: 1.12 IU/mL — ABNORMAL HIGH (ref 0.30–0.70)

## 2019-10-08 LAB — ECHOCARDIOGRAM COMPLETE
Height: 72 in
Weight: 3180.8 oz

## 2019-10-08 LAB — COMPREHENSIVE METABOLIC PANEL
ALT: 24 U/L (ref 0–44)
AST: 64 U/L — ABNORMAL HIGH (ref 15–41)
Albumin: 3.2 g/dL — ABNORMAL LOW (ref 3.5–5.0)
Alkaline Phosphatase: 31 U/L — ABNORMAL LOW (ref 38–126)
Anion gap: 7 (ref 5–15)
BUN: 22 mg/dL (ref 8–23)
CO2: 30 mmol/L (ref 22–32)
Calcium: 8.7 mg/dL — ABNORMAL LOW (ref 8.9–10.3)
Chloride: 106 mmol/L (ref 98–111)
Creatinine, Ser: 1.22 mg/dL (ref 0.61–1.24)
GFR calc Af Amer: >60 mL/min (ref >60)
GFR calc non Af Amer: 53 mL/min — ABNORMAL LOW (ref >60)
Glucose, Bld: 149 mg/dL — ABNORMAL HIGH (ref 70–99)
Potassium: 3.9 mmol/L (ref 3.5–5.1)
Sodium: 143 mmol/L (ref 135–145)
Total Bilirubin: 1.6 mg/dL — ABNORMAL HIGH (ref 0.3–1.2)
Total Protein: 6.4 g/dL — ABNORMAL LOW (ref 6.5–8.1)

## 2019-10-08 LAB — CBC
HCT: 42.3 % (ref 39.0–52.0)
Hemoglobin: 13.3 g/dL (ref 13.0–17.0)
MCH: 26.2 pg (ref 26.0–34.0)
MCHC: 31.4 g/dL (ref 30.0–36.0)
MCV: 83.4 fL (ref 80.0–100.0)
Platelets: 84 10*3/uL — ABNORMAL LOW (ref 150–400)
RBC: 5.07 MIL/uL (ref 4.22–5.81)
RDW: 19.2 % — ABNORMAL HIGH (ref 11.5–15.5)
WBC: 9.7 10*3/uL (ref 4.0–10.5)
nRBC: 0 % (ref 0.0–0.2)

## 2019-10-08 LAB — PREALBUMIN: Prealbumin: 14.4 mg/dL — ABNORMAL LOW (ref 18–38)

## 2019-10-08 LAB — PROTIME-INR
INR: 1.3 — ABNORMAL HIGH (ref 0.8–1.2)
Prothrombin Time: 16.3 seconds — ABNORMAL HIGH (ref 11.4–15.2)

## 2019-10-08 LAB — PHOSPHORUS: Phosphorus: 4.1 mg/dL (ref 2.5–4.6)

## 2019-10-08 LAB — APTT: aPTT: 33 seconds (ref 24–36)

## 2019-10-08 LAB — MAGNESIUM: Magnesium: 2 mg/dL (ref 1.7–2.4)

## 2019-10-08 LAB — CK: Total CK: 1881 U/L — ABNORMAL HIGH (ref 49–397)

## 2019-10-08 LAB — TSH: TSH: 1.846 u[IU]/mL (ref 0.350–4.500)

## 2019-10-08 MED ORDER — ADULT MULTIVITAMIN W/MINERALS CH
1.0000 | ORAL_TABLET | Freq: Every day | ORAL | Status: DC
  Administered 2019-10-09 – 2019-10-10 (×2): 1 via ORAL
  Filled 2019-10-08 (×2): qty 1

## 2019-10-08 MED ORDER — AMMONIUM LACTATE 12 % EX LOTN
1.0000 "application " | TOPICAL_LOTION | Freq: Two times a day (BID) | CUTANEOUS | Status: DC
  Administered 2019-10-08 – 2019-10-11 (×7): 1 via TOPICAL
  Filled 2019-10-08: qty 400

## 2019-10-08 MED ORDER — TAMSULOSIN HCL 0.4 MG PO CAPS
0.4000 mg | ORAL_CAPSULE | Freq: Every day | ORAL | Status: DC
  Administered 2019-10-08 – 2019-10-10 (×3): 0.4 mg via ORAL
  Filled 2019-10-08 (×3): qty 1

## 2019-10-08 MED ORDER — APIXABAN 5 MG PO TABS
10.0000 mg | ORAL_TABLET | Freq: Two times a day (BID) | ORAL | Status: DC
  Administered 2019-10-08 – 2019-10-10 (×5): 10 mg via ORAL
  Filled 2019-10-08 (×5): qty 2

## 2019-10-08 MED ORDER — NEPRO/CARBSTEADY PO LIQD
237.0000 mL | Freq: Three times a day (TID) | ORAL | Status: DC
  Administered 2019-10-08 (×2): 237 mL via ORAL

## 2019-10-08 MED ORDER — PANTOPRAZOLE SODIUM 40 MG PO TBEC
40.0000 mg | DELAYED_RELEASE_TABLET | Freq: Every day | ORAL | Status: DC
  Administered 2019-10-08 – 2019-10-10 (×3): 40 mg via ORAL
  Filled 2019-10-08 (×3): qty 1

## 2019-10-08 MED ORDER — DILTIAZEM HCL ER COATED BEADS 120 MG PO CP24
240.0000 mg | ORAL_CAPSULE | Freq: Every day | ORAL | Status: DC
  Administered 2019-10-08: 240 mg via ORAL
  Filled 2019-10-08: qty 2

## 2019-10-08 MED ORDER — APIXABAN 5 MG PO TABS: 5.0000 mg | ORAL_TABLET | Freq: Two times a day (BID) | ORAL | Status: DC

## 2019-10-08 MED ORDER — SODIUM CHLORIDE 0.9 % IV SOLN: INTRAVENOUS | Status: DC

## 2019-10-08 NOTE — Progress Notes (Signed)
[place] for Eliquis Indication: pulmonary embolus  No Known Allergies  Patient Measurements: Height: 6' (182.9 cm) Weight: 198 lb 12.8 oz (90.2 kg) IBW/kg (Calculated) : 77.6   Vital Signs: Temp: 97.5 F (36.4 C) (01/25 0743) Temp Source: Oral (01/25 0743) BP: 164/45 (01/25 0743) Pulse Rate: 59 (01/25 0743)  Labs: Recent Labs    10/07/19 1646 10/07/19 1946 10/07/19 2312 10/08/19 0028 10/08/19 0427  HGB 14.9  --   --   --  13.3  HCT 48.3  --   --   --  42.3  PLT 93*  --   --   --  84*  APTT  --   --   --  33  --   LABPROT  --   --   --  16.3*  --   INR  --   --   --  1.3*  --   CREATININE 1.36*  --   --   --  1.22  CKTOTAL 2,536*  --   --   --  1,881*  TROPONINIHS 39* 46* 37*  --   --     Estimated Creatinine Clearance: 47.7 mL/min (by C-G formula based on SCr of 1.22 mg/dL).   Medical History: Past Medical History:  Diagnosis Date  . Diverticulitis   . Hypercholesteremia   . Hypertension   . Lower extremity edema   . Skin cancer     Assessment: No anticoagulants listed on PTA med list.  Baseline labs ordered.  Pharmacy asked to initiate Heparin for PE. Plts < 100K, pt has h/o chronic thrombocytopenia.    Goal of Therapy:  Heparin level 0.3-0.7 units/ml Monitor platelets by anticoagulation protocol: Yes   Plan:  Patient to transition to Eliquis for new PE and bilateral DVTs. Will start Eliquis 10 mg BID x 7 days, followed by Eliquis 5 mg BID. Continue to monitor CBC per protocol. Platelets low, patient with h/o thrombocytopenia.  Tawnya Crook, PharmD 10/08/2019,11:40 AM

## 2019-10-08 NOTE — Consult Note (Signed)
[facility] Consultation Note  Patient ID: Keith Reel., MRN: [id number], DOB/AGE: 1931-04-02 86 y.o. Admit date: 10/07/2019   Date of Consult: 10/08/2019 Primary Physician: Leonel Ramsay, MD Primary Cardiologist: Paraschos  Chief Complaint:  Chief Complaint  Patient presents with  . Weakness   Reason for Consult: Elevated troponin  HPI: 86 y.o. male with apparent acute aspiration pneumonia as well as rhabdomyolysis due to patient unable to do any activity or get off the floor for many hours.  When arrival to the emergency room the patient had an EKG showing normal sinus rhythm first-degree AV block PVCs and apparently previous inferior infarct age undetermined.  Chest x-ray showed pleural effusion and some pulmonary edema with a BNP of 1312 chronic kidney disease with a glomerular filtration rate of 53 and a CK of 1881 troponin of 96.  The patient appears to have had acute aspiration pneumonia for which she is being treated with appropriate medication management and slowly improved.  Previously there has been concerns of apparent atrial fibrillation although currently in normal sinus rhythm and does have Eliquis at this time.  The patient does have an elevated troponin most consistent with demand ischemia rather than acute coronary syndrome.  Currently he is hemodynamically stable and slowly improving from current issues listed above Echocardiogram shows normal LV systolic function with no evidence of previous myocardial infarction and ejection fraction of 55 to 60% with mild valvular heart disease Past Medical History:  Diagnosis Date  . Diverticulitis   . Hypercholesteremia   . Hypertension   . Lower extremity edema   . Skin cancer       Surgical History:  Past Surgical History:  Procedure Laterality Date  . CATARACT EXTRACTION W/PHACO Right 09/18/2015   Procedure: CATARACT EXTRACTION PHACO AND INTRAOCULAR LENS PLACEMENT (IOC);  Surgeon: Leandrew Koyanagi, MD;   Location: [facility] ORS;  Service: Ophthalmology;  Laterality: Right;  US00:57.4 AP12.5 CDE7.15 FLUID LOT [id number]  . CATARACT EXTRACTION W/PHACO Left 10/16/2015   Procedure: CATARACT EXTRACTION PHACO AND INTRAOCULAR LENS PLACEMENT (IOC);  Surgeon: Leandrew Koyanagi, MD;  Location: [facility] ORS;  Service: Ophthalmology;  Laterality: Left;  [place]   1:02.9 AP%  12.5 CDE    8.81 casette lot # [id number]   exp 02/10/2017  . COLON RESECTION    . COLONOSCOPY    . HERNIA REPAIR    . TONSILLECTOMY       Home Meds: Prior to Admission medications   Medication Sig Start Date End Date Taking? Authorizing Provider  ammonium lactate (LAC-HYDRIN) 12 % lotion Apply 1 application topically 2 (two) times daily.   Yes [provider]  atorvastatin (LIPITOR) 20 MG tablet Take 20 mg by mouth daily.   Yes [provider]  cholecalciferol (VITAMIN D3) 25 MCG (1000 UNIT) tablet Take 1,000 Units by mouth daily.   Yes [provider]  Cyanocobalamin (VITAMIN B-12) 500 MCG LOZG Take 1,000 mcg by mouth daily.    Yes [provider]  diltiazem (CARDIZEM CD) 240 MG 24 hr capsule Take 240 mg by mouth daily.    Yes [provider]  ferrous sulfate 325 (65 FE) MG tablet Take 325 mg by mouth daily with breakfast.   Yes [provider]  furosemide (LASIX) 40 MG tablet Take 40 mg by mouth daily.   Yes [provider]  Magnesium 400 MG TABS Take 400 mg by mouth daily.    Yes [provider]  Multiple Vitamins-Minerals (CENTRUM SILVER PO) Take 1  tablet by mouth daily.   Yes [provider]  omeprazole (PRILOSEC) 20 MG capsule Take 20 mg by mouth daily.    Yes [provider]  oxybutynin (DITROPAN-XL) 5 MG 24 hr tablet Take 5 mg by mouth daily.    Yes [provider]  potassium chloride SA (KLOR-CON) 20 MEQ tablet Take 20 mEq by mouth daily.   Yes [provider]  tamsulosin (FLOMAX) 0.4 MG CAPS capsule Take 0.4 mg by mouth daily.    Yes [provider]    Inpatient Medications:  . ammonium lactate  1 application Topical BID  . apixaban  10 mg Oral BID   Followed by  . [START ON 10/15/2019] apixaban  5 mg Oral BID  . feeding supplement (NEPRO CARB STEADY)  237 mL Oral TID BM  . [START ON 10/09/2019] multivitamin with minerals  1 tablet Oral Daily  . pantoprazole  40 mg Oral Daily  . tamsulosin  0.4 mg Oral Daily   . sodium chloride 50 mL/hr at 10/08/19 1154    Allergies: No Known Allergies  Social History   Socioeconomic History  . Marital status: Married    Spouse name: Not on file  . Number of children: Not on file  . Years of education: Not on file  . Highest education level: Not on file  Occupational History  . Not on file  Tobacco Use  . Smoking status: Former Smoker    Quit date: 03/29/1972    Years since quitting: 47.5  . Smokeless tobacco: Never Used  Substance and Sexual Activity  . 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 Drug use: No  . Sexual activity: Not on file  Other Topics Concern  . Not on file  Social History Narrative  . Not on file   Social Determinants of Health   Financial Resource Strain:   . Difficulty of Paying Living Expenses: Not on file  Food Insecurity:   . Worried About Charity fundraiser in the Last Year: Not on file  . Ran Out of Food in the Last Year: Not on file  Transportation Needs:   . Lack of Transportation (Medical): Not on file  . Lack of Transportation (Non-Medical): Not on file  Physical Activity:   . Days of Exercise per Week: Not on file  . Minutes of Exercise per Session: Not on file  Stress:   . Feeling of Stress : Not on file  Social Connections:   . Frequency of Communication with Friends and Family: Not on file  . Frequency of Social Gatherings with Friends and Family: Not on file  . Attends Religious Services: Not on file  . Active Member of Clubs or Organizations: Not on file  . Attends Theatre manager Meetings: Not on file  . Marital Status: Not on file  Intimate Partner Violence:   . Fear of Current or Ex-Partner: Not on file  . Emotionally Abused: Not on file  . Physically Abused: Not on file  . Sexually Abused: Not on file     Family History  Problem Relation Age of Onset  . Heart disease Father   . Cancer Father   . Cancer Sister      Review of Systems Cannot assess Labs: Recent Labs    10/07/19 1646 10/08/19 0427  CKTOTAL 2,536* 1,881*   Lab Results  Component Value Date   WBC 9.7 10/08/2019   HGB 13.3 10/08/2019  HCT 42.3 10/08/2019   MCV 83.4 10/08/2019   PLT 84 (L) 10/08/2019    Recent Labs  Lab 10/08/19 0427  NA 143  K 3.9  CL 106  CO2 30  BUN 22  CREATININE 1.22  CALCIUM 8.7*  PROT 6.4*  BILITOT 1.6*  ALKPHOS 31*  ALT 24  AST 64*  GLUCOSE 149*   No results found for: CHOL, HDL, LDLCALC, TRIG No results found for: DDIMER  Radiology/Studies:  DG Chest 1 View  Result Date: 10/07/2019 CLINICAL DATA:  Weakness. EXAM: CHEST  1 VIEW COMPARISON:  07/01/2018 FINDINGS: Heart size is borderline enlarged. Aortic calcifications are noted. There is a persistent left-sided pleural effusion with bibasilar airspace disease. Vascular congestion without overt pulmonary edema is noted. There is no acute osseous abnormality. There is no pneumothorax. IMPRESSION: 1. Cardiomegaly with mild vascular congestion. 2. Persistent bibasilar airspace disease with a small left-sided pleural effusion. 3. Aortic atherosclerosis is noted. Electronically Signed   By: Constance Holster M.D.   On: 10/07/2019 16:49   CT Head Wo Contrast  Result Date: 10/07/2019 CLINICAL DATA:  RIGHT eye swelling and redness, found on couch in the same spot as yesterday, soaked with urine, encephalopathy, hypertension EXAM: CT HEAD WITHOUT CONTRAST TECHNIQUE: Contiguous axial images were obtained from the base of the skull through the vertex without intravenous contrast. Sagittal and  coronal MPR images reconstructed from axial data set. COMPARISON:  07/20/2011 FINDINGS: Brain: Generalized atrophy. Normal ventricular morphology. No midline shift or mass effect. Small vessel chronic ischemic changes of deep cerebral white matter. No intracranial hemorrhage, mass lesion or evidence of acute infarction. No extra-axial fluid collections. Vascular: No hyperdense vessels Skull: Intact Sinuses/Orbits: Clear Other: N/A IMPRESSION: Atrophy with small vessel chronic ischemic changes of deep cerebral white matter. No acute intracranial abnormalities. Electronically Signed   By: Lavonia Dana M.D.   On: 10/07/2019 17:38   CT ANGIO CHEST PE W OR WO CONTRAST  Result Date: 10/07/2019 CLINICAL DATA:  Failure to thrive, altered mental status and pneumonia. EXAM: CT ANGIOGRAPHY CHEST WITH CONTRAST TECHNIQUE: Multidetector CT imaging of the chest was performed using the standard protocol during bolus administration of intravenous contrast. Multiplanar CT image reconstructions and MIPs were obtained to evaluate the vascular anatomy. CONTRAST:  76mL OMNIPAQUE IOHEXOL 350 MG/ML SOLN COMPARISON:  None. FINDINGS: Cardiovascular: Contrast injection is sufficient to demonstrate satisfactory opacification of the pulmonary arteries to the segmental level. There is a pulmonary embolus within the right lower lobar artery extending into the segmental branches. There is no evidence of right heart strain. No contralateral embolus. The size of the main pulmonary artery is normal. Heart size is normal, with no pericardial effusion. There is calcific aortic atherosclerosis. Aortic caliber is normal. Mediastinum/Nodes: No mediastinal, hilar or axillary lymphadenopathy. The visualized thyroid and thoracic esophageal course are unremarkable. Lungs/Pleura: Airways are patent. No focal consolidation, pulmonary infarct or pleural effusion. Upper Abdomen: Contrast bolus timing is not optimized for evaluation of the abdominal organs. The  visualized portions of the organs of the upper abdomen are normal. Musculoskeletal: No chest wall abnormality. No bony spinal canal stenosis. Review of the MIP images confirms the above findings. IMPRESSION: 1. Right lower lobar pulmonary embolus extending into the segmental branches. 2. No evidence of right heart strain. 3. Critical Value/emergent results were called by telephone at the time of interpretation on 10/07/2019 at 11:00 pm to Dr. Toy Baker , who verbally acknowledged these results. Aortic Atherosclerosis (ICD10-I70.0). Electronically Signed   By: Ulyses Jarred  M.D.   On: 10/07/2019 23:00   MR BRAIN WO CONTRAST  Result Date: 10/07/2019 CLINICAL DATA:  Focal neuro deficit, greater than 6 hours, stroke suspected. EXAM: MRI HEAD WITHOUT CONTRAST TECHNIQUE: Multiplanar, multiecho pulse sequences of the brain and surrounding structures were obtained without intravenous contrast. COMPARISON:  Head CT 10/07/2019, head CT 07/21/2011 FINDINGS: Brain: There is a subcentimeter focus of diffusion weighted hyperintensity within the right temporal periatrial white matter (series 5, image 22) without ADC correlate. Findings likely reflect a small focus of subacute ischemia. Otherwise, no evidence of acute infarct. There is no evidence of intracranial mass. No midline shift or extra-axial fluid collection. Moderate patchy and confluent T2/FLAIR hyperintensity within the cerebral white matter is nonspecific, but consistent with chronic small vessel ischemic disease. Chronic small vessel ischemic changes are also present within the thalami, bilateral basal ganglia and pons. Chronic microhemorrhages within the right thalamus and right cerebellum. Advanced generalized parenchymal atrophy. Vascular: Flow voids maintained within the proximal large arterial vessels. Skull and upper cervical spine: No focal marrow lesion Sinuses/Orbits: Bilateral lens replacements. Mild mucosal thickening within the bilateral  ethmoid and right maxillary sinuses. Small left mastoid effusion. Trace fluid also present within right mastoid air cells. IMPRESSION: Subcentimeter focus of diffusion weighted hyperintensity within the right temporal periatrial white matter likely reflecting subacute ischemia. Moderate chronic small vessel ischemic disease. Advanced generalized parenchymal atrophy Small left mastoid effusion. Electronically Signed   By: Kellie Simmering DO   On: 10/07/2019 22:52   US Venous Img Lower Bilateral (DVT)  Result Date: 10/08/2019 CLINICAL DATA:  Acute PE by CTA EXAM: BILATERAL LOWER EXTREMITY VENOUS DOPPLER ULTRASOUND TECHNIQUE: Gray-scale sonography with graded compression, as well as color Doppler and duplex ultrasound were performed to evaluate the lower extremity deep venous systems from the level of the common femoral vein and including the common femoral, femoral, profunda femoral, popliteal and calf veins including the posterior tibial, peroneal and gastrocnemius veins when visible. The superficial great saphenous vein was also interrogated. Spectral Doppler was utilized to evaluate flow at rest and with distal augmentation maneuvers in the common femoral, femoral and popliteal veins. COMPARISON:  None. FINDINGS: RIGHT LOWER EXTREMITY Common Femoral Vein: No evidence of thrombus. Normal compressibility, respiratory phasicity and response to augmentation. Saphenofemoral Junction: No evidence of thrombus. Normal compressibility and flow on color Doppler imaging. Profunda Femoral Vein: No evidence of thrombus. Normal compressibility and flow on color Doppler imaging. Femoral Vein: Distal femoral vein is noncompressible intraluminal hypoechoic thrombus appearing occlusive. Popliteal Vein: Occlusive popliteal thrombus present. Vessel is noncompressible. Calf Veins: Thrombus also extends into the calf tibial and peroneal veins appearing occlusive and noncompressible. LEFT LOWER EXTREMITY Common Femoral Vein: No evidence  of thrombus. Normal compressibility, respiratory phasicity and response to augmentation. Saphenofemoral Junction: No evidence of thrombus. Normal compressibility and flow on color Doppler imaging. Profunda Femoral Vein: No evidence of thrombus. Normal compressibility and flow on color Doppler imaging. Femoral Vein: No evidence of thrombus. Normal compressibility, respiratory phasicity and response to augmentation. Popliteal Vein: Hypoechoic intraluminal thrombus. Thrombus appears nonocclusive. Vessel is partially compressible. Calf Veins: Popliteal DVT does appear to extend into the tibial and peroneal veins appearing occlusive and noncompressible. IMPRESSION: Positive exam for bilateral DVT as above, slightly worse on the right. Electronically Signed   By: Jerilynn Mages.  Shick M.D.   On: 10/08/2019 08:58   ECHOCARDIOGRAM COMPLETE  Result Date: 10/08/2019   ECHOCARDIOGRAM REPORT   Patient Name:   Antwaun A Baskett Sr. Date of Exam: 10/08/2019 Medical Rec #:  [id number]         Height:       72.0 in Accession #:    [id number]        Weight:       198.8 lb Date of Birth:  1931-08-14          BSA:          2.13 m Patient Age:    86 years          BP:           164/45 mmHg Patient Gender: M                 HR:           59 bpm. Exam Location:  [facility] Procedure: 2D Echo, Color Doppler and Cardiac Doppler Indications:     Elevated troponin  History:         Patient has prior history of Echocardiogram examinations, most                  recent 07/03/2019. Risk Factors:Hypertension.  Sonographer:     [name] RDCS (AE) Referring Phys:  [id number] Loletha Grayer Diagnosing Phys: Serafina Royals MD IMPRESSIONS  1. Left ventricular ejection fraction, by visual estimation, is 50 to 55%. The left ventricle has normal function. There is no left ventricular hypertrophy.  2. The left ventricle has no regional wall motion abnormalities.  3. Global right ventricle has normal systolic function.The right ventricular size is normal. No increase in right  ventricular wall thickness.  4. Left atrial size was normal.  5. Right atrial size was normal.  6. The mitral valve is normal in structure. Mild mitral valve regurgitation.  7. The tricuspid valve is normal in structure.  8. The tricuspid valve is normal in structure. Tricuspid valve regurgitation is mild.  9. The aortic valve is normal in structure. Aortic valve regurgitation is trivial. Mild aortic valve stenosis. 10. The pulmonic valve was normal in structure. Pulmonic valve regurgitation is not visualized. 11. Mildly elevated pulmonary artery systolic pressure. FINDINGS  Left Ventricle: Left ventricular ejection fraction, by visual estimation, is 50 to 55%. The left ventricle has normal function. The left ventricle has no regional wall motion abnormalities. There is no left ventricular hypertrophy. Right Ventricle: The right ventricular size is normal. No increase in right ventricular wall thickness. Global RV systolic function is has normal systolic function. The tricuspid regurgitant velocity is 2.58 m/s, and with an assumed right atrial pressure  of 10 mmHg, the estimated right ventricular systolic pressure is mildly elevated at 36.6 mmHg. Left Atrium: Left atrial size was normal in size. Right Atrium: Right atrial size was normal in size Pericardium: There is no evidence of pericardial effusion. Mitral Valve: The mitral valve is normal in structure. Mild mitral valve regurgitation. Tricuspid Valve: The tricuspid valve is normal in structure. Tricuspid valve regurgitation is mild. Aortic Valve: The aortic valve is normal in structure. Aortic valve regurgitation is trivial. Aortic regurgitation PHT measures 498 msec. Mild aortic stenosis is present. Aortic valve mean gradient measures 10.0 mmHg. Aortic valve peak gradient measures 20.9 mmHg. Aortic valve area, by VTI measures 1.84 cm. Pulmonic Valve: The pulmonic valve was normal in structure. Pulmonic valve regurgitation is not visualized. Pulmonic  regurgitation is not visualized. Aorta: The aortic root, ascending aorta and aortic arch are all structurally normal, with no evidence of dilitation or obstruction. IAS/Shunts: No atrial level shunt detected by color flow Doppler.  LEFT VENTRICLE PLAX 2D  LVIDd:         3.94 cm  Diastology LVIDs:         2.59 cm  LV e' lateral:   4.90 cm/s LV PW:         1.50 cm  LV E/e' lateral: 13.4 LV IVS:        1.74 cm  LV e' medial:    4.46 cm/s LVOT diam:     2.00 cm  LV E/e' medial:  14.7 LV SV:         43 ml LV SV Index:   20.04 LVOT Area:     3.14 cm  RIGHT VENTRICLE RV Basal diam:  3.86 cm RV S prime:     16.50 cm/s TAPSE (M-mode): 4.7 cm LEFT ATRIUM             Index       RIGHT ATRIUM           Index LA diam:        4.40 cm 2.07 cm/m  RA Area:     23.60 cm LA Vol (A2C):   48.5 ml 22.82 ml/m RA Volume:   68.10 ml  32.05 ml/m LA Vol (A4C):   71.8 ml 33.79 ml/m LA Biplane Vol: 64.1 ml 30.16 ml/m  AORTIC VALVE                    PULMONIC VALVE AV Area (Vmax):    1.50 cm     PV Vmax:        0.78 m/s AV Area (Vmean):   1.82 cm     PV Peak grad:   2.4 mmHg AV Area (VTI):     1.84 cm     RVOT Peak grad: 3 mmHg AV Vmax:           228.50 cm/s AV Vmean:          141.000 cm/s AV VTI:            0.430 m AV Peak Grad:      20.9 mmHg AV Mean Grad:      10.0 mmHg LVOT Vmax:         109.00 cm/s LVOT Vmean:        81.600 cm/s LVOT VTI:          0.252 m LVOT/AV VTI ratio: 0.59 AI PHT:            498 msec MITRAL VALVE                        TRICUSPID VALVE MV Area (PHT): 3.00 cm             TR Peak grad:   26.6 mmHg MV PHT:        73.37 msec           TR Vmax:        258.00 cm/s MV Decel Time: 253 msec MV E velocity: 65.50 cm/s 103 cm/s  SHUNTS MV A velocity: 96.50 cm/s 70.3 cm/s Systemic VTI:  0.25 m MV E/A ratio:  0.68       1.5       Systemic Diam: 2.00 cm  Serafina Royals MD Electronically signed by Serafina Royals MD Signature Date/Time: 10/08/2019/4:47:15 PM    Final     EKG: Normal sinus rhythm with first-degree AV block  and preventricular contractions cannot rule out inferior infarct age undetermined  Weights: Filed Weights   10/07/19  1643 10/07/19 2111  Weight: 103 kg 90.2 kg     Physical Exam: Blood pressure (!) 142/54, pulse (!) 55, temperature 98.6 F (37 C), temperature source Oral, resp. rate 17, height 6' (1.829 m), weight 90.2 kg, SpO2 99 %. Body mass index is 26.96 kg/m.  Further physical exam as per prime doc    Assessment: 86 year old male with apparent previous myocardial infarction with echocardiogram showing no evidence of LV systolic dysfunction some pleural effusion aspiration pneumonia acute diastolic dysfunction heart failure multifactorial in nature with elevated troponin consistent with demand ischemia and condition of acute rhabdomyolysis with slow improvements  Plan: 1.  Continue hydration and supportive care of acute rhabdomyolysis aspiration pneumonia and other illness 2.  No further intervention minimal elevation of troponin most consistent with demand ischemia and acute rhabdomyolysis rather than acute coronary syndrome 3.  Further begin ambulation after above and follow for any rhythm disturbances or other concerns needing adjustments of medication  Signed, Corey Skains M.D. [facility] 10/08/2019, 5:17 PM

## 2019-10-08 NOTE — Progress Notes (Signed)
*  PRELIMINARY RESULTS* Echocardiogram 2D Echocardiogram has been performed.  [name] 10/08/2019, 1:59 PM

## 2019-10-08 NOTE — Progress Notes (Signed)
OT Cancellation Note  Patient Details Name: Keith A Hasting Sr. MRN: [id number] DOB: 08-Oct-1930   Cancelled Treatment:    Reason Eval/Treat Not Completed: Medical issues which prohibited therapy. Consult received, chart reviewed. CT chest showed small PE. Heparin initiated on 1/25, 0037. Dopplers of bilateral lower extremities pending. Will hold OT evaluation at this time and re-attempt at later date/time as medically appropriate.   Jeni Salles, MPH, MS, OTR/L ascom [phone] 10/08/19, 8:37 AM

## 2019-10-08 NOTE — Progress Notes (Signed)
Pt with poor PO intake. Refusing all meals and supplements. Education provided.

## 2019-10-08 NOTE — Plan of Care (Signed)
Confused and uncooperative with admission questioning on arrival to floor.

## 2019-10-08 NOTE — Progress Notes (Signed)
Pt assessed at the beginning of shift, SR with a first degree block on tele monitor noted. Pt returned from ultrasound without tele box. Monitoring dept notified and new box placed on pt with continuous pulse ox as ordered. Pt remains in SR with a first degree block. Heparin drip D/C'd. 96% on room air. Wife at bedside and updated. Will continue plan of care.

## 2019-10-08 NOTE — Plan of Care (Signed)
  Problem: Education: Goal: Knowledge of General Education information will improve Description: Including pain rating scale, medication(s)/side effects and non-pharmacologic comfort measures Outcome: Progressing   Problem: Consults Goal: Pharmacy Consult for anticoagulation Outcome: Progressing Goal: Skin Care Protocol Initiated - if Braden Score 18 or less Description: If consults are not indicated, leave blank or document N/A Outcome: Progressing Goal: Nutrition Consult-if indicated Outcome: Progressing Goal: Diabetes Guidelines if Diabetic/Glucose > 140 Description: If diabetic or lab glucose is > 140 mg/dl - Initiate Diabetes/Hyperglycemia Guidelines & Document Interventions  Outcome: Progressing   Problem: Phase I Progression Outcomes Goal: Pain controlled with appropriate interventions Outcome: Progressing Goal: Dyspnea controlled at rest (PE) Outcome: Progressing Goal: Tolerating diet Outcome: Progressing Goal: Initial discharge plan identified Outcome: Progressing Goal: Voiding-avoid urinary catheter unless indicated Outcome: Progressing Goal: Hemodynamically stable Outcome: Progressing Goal: Other Phase I Outcomes/Goals Outcome: Progressing

## 2019-10-08 NOTE — Evaluation (Signed)
Clinical/Bedside Swallow Evaluation Patient Details  Name: Keith A Alabi Sr. MRN: [id number] Date of Birth: 1930/12/30  Today's Date: 10/08/2019 Time: SLP Start Time (ACUTE ONLY): 0908 SLP Stop Time (ACUTE ONLY): 0945 SLP Time Calculation (min) (ACUTE ONLY): 37 min  Past Medical History:  Past Medical History:  Diagnosis Date  . Diverticulitis   . Hypercholesteremia   . Hypertension   . Lower extremity edema   . Skin cancer    Past Surgical History:  Past Surgical History:  Procedure Laterality Date  . CATARACT EXTRACTION W/PHACO Right 09/18/2015   Procedure: CATARACT EXTRACTION PHACO AND INTRAOCULAR LENS PLACEMENT (IOC);  Surgeon: Leandrew Koyanagi, MD;  Location: [facility] ORS;  Service: Ophthalmology;  Laterality: Right;  US00:57.4 AP12.5 CDE7.15 FLUID LOT [id number]  . CATARACT EXTRACTION W/PHACO Left 10/16/2015   Procedure: CATARACT EXTRACTION PHACO AND INTRAOCULAR LENS PLACEMENT (IOC);  Surgeon: Leandrew Koyanagi, MD;  Location: [facility] ORS;  Service: Ophthalmology;  Laterality: Left;  [place]   1:02.9 AP%  12.5 CDE    8.81 casette lot # [id number]   exp 02/10/2017  . COLON RESECTION    . COLONOSCOPY    . HERNIA REPAIR    . TONSILLECTOMY     HPI:  Per H&P: "Keith A Erny Sr. is a 86 y.o. male with medical history significant of diverticulitis, hyperlipidemia, hypertension, aspiration pneumonia, urinary incontinence, SBO, diastolic CHF.  Presented with   generalized weakness apparently yesterday EMS was called out to his house because he could not ambulate.  They got him up and set him in his arm chair.  They checked on him again in 24 hours he remained in the same position and has urinated on himself but was unable to get up to go to the bathroom."   Assessment / Plan / Recommendation Clinical Impression  This pleasant 86 y/o male presents w/positive s/s pharyngeal dysphagia & aspiration c/b immediate and delayed throat clearing and wet vocal quality inconsistently w/ small single sips  thin liquid by cup and by straw. Pt tolerated small single sips Nectar thick liquids much better w/no apparent s/s aspiration. Chart review reveals hx of dysphagia, aspiration pneumonia and new RLL PE.  Pt denies hx of dysphagia and aspiration pneumonia. Pt appears to be a moderately good historian responding to all SLP questions appropriately and following all directions w/ease. Wife currently unavailable for further information. Chest xray reveals small area of L pleural effusion. MRI reveals "Subcentimeter focus of diffusion weighted hyperintensity within the right temporal periatrial white matter likely reflecting subacute Ischemia." Oral phase largely [facility] except for decreased coordination w/AP transit w/thin liquids. Oral mech exam revealed dry mouth, areas of poor lower dentition and generalized weakness. Pt masticated solids well in a timely manner w/no evidence of oral residue.  Rec. Dysphagia III (mech soft) diet w/nectar thick liquids w/extra gravies/sauces on side for added moisture, give meds whole in puree, strict aspiration precautions, and ensure strict adherence to following safe swallow rec's: small, single sips by cup, position pt fully upright for any PO intake, remain sitting upright for up to 60 minutes following PO intake, no straws. Discussed above findings and rec's w/pt and nsg who both stated agreement and understanding. Pt may require MBSS to further assess pharyngeal phase of swallow, if swallow function does not improve as pt's overall status improves. SLP to f/u w/toleration of diet, offer trials of upgraded consistency as appropriate, and determine if future MBSS is warranted. SLP Visit Diagnosis: Dysphagia, unspecified (R13.10)    Aspiration Risk  Moderate  aspiration risk    Diet Recommendation Dysphagia 3 (Mech soft);Nectar-thick liquid   Liquid Administration via: Cup;No straw Medication Administration: Whole meds with puree Supervision: Staff to assist with self  feeding Compensations: Minimize environmental distractions;Slow rate;Small sips/bites Postural Changes: Seated upright at 90 degrees;Remain upright for at least 30 minutes after po intake    Other  Recommendations Oral Care Recommendations: Staff/trained caregiver to provide oral care Other Recommendations: Order thickener from pharmacy;Prohibited food (jello, ice cream, thin soups);Remove water pitcher;Clarify dietary restrictions   Follow up Recommendations Other (comment)(TBD)      Frequency and Duration min 2x/week  2 weeks       Prognosis Prognosis for Safe Diet Advancement: Fair Barriers to Reach Goals: Severity of deficits      Swallow Study   General Date of Onset: 10/08/19 HPI: Per H&P: "Keith A Derasmo Sr. is a 86 y.o. male with medical history significant of diverticulitis, hyperlipidemia, hypertension, aspiration pneumonia, urinary incontinence, SBO, diastolic CHF.  Presented with   generalized weakness apparently yesterday EMS was called out to his house because he could not ambulate.  They got him up and set him in his arm chair.  They checked on him again in 24 hours he remained in the same position and has urinated on himself but was unable to get up to go to the bathroom." Type of Study: Bedside Swallow Evaluation Diet Prior to this Study: NPO;Other (Comment)(except sips w/meds) Temperature Spikes Noted: No Respiratory Status: Room air History of Recent Intubation: No Behavior/Cognition: Cooperative;Pleasant mood;Lethargic/Drowsy Oral Cavity Assessment: Dry Oral Care Completed by SLP: Yes Oral Cavity - Dentition: Adequate natural dentition;Other (Comment)(Grossly WFL, portions of lower dentition poor condition.) Vision: Functional for self-feeding Self-Feeding Abilities: Able to feed self;Needs set up Patient Positioning: Upright in bed Baseline Vocal Quality: Hoarse;Low vocal intensity Volitional Cough: Strong Volitional Swallow: Able to elicit     Oral/Motor/Sensory Function Overall Oral Motor/Sensory Function: Generalized oral weakness Facial Symmetry: Within Functional Limits   Ice Chips Ice chips: Within functional limits   Thin Liquid Thin Liquid: Impaired Presentation: Cup;Self Fed;Straw Oral Phase Impairments: Reduced lingual movement/coordination Oral Phase Functional Implications: Other (comment)(decreased coordination w/A-P transit) Pharyngeal  Phase Impairments: Wet Vocal Quality;Throat Clearing - Immediate;Throat Clearing - Delayed Other Comments: inconsistent s/s pharyngeal dysphagia    Nectar Thick Nectar Thick Liquid: Within functional limits Presentation: Cup   Honey Thick Honey Thick Liquid: Not tested   Puree Puree: Within functional limits Presentation: Self Fed;Spoon   Solid     Solid: Within functional limits Presentation: Self Fed      Yuliet Needs, MA, CCC-SLP 10/08/2019,10:19 AM

## 2019-10-08 NOTE — Progress Notes (Signed)
Patient ID: Keith A Pastorino Sr., male   DOB: 04-Aug-1931, 86 y.o.   MRN: [id number] [facility] PROGRESS NOTE  Keith Bryant [id number] DOB: 1931-03-18 DOA: 10/07/2019 PCP: Leonel Ramsay, MD  HPI/Subjective: Patient seen this morning and awaken from sleep.  Answers some simple yes/no questions but went back asleep.  Objective: Vitals:   10/08/19 0743 10/08/19 1556  BP: (!) 164/45 (!) 142/54  Pulse: (!) 59 (!) 55  Resp: 18 17  Temp: (!) 97.5 F (36.4 C) 98.6 F (37 C)  SpO2: 94% 99%    Intake/Output Summary (Last 24 hours) at 10/08/2019 1602 Last data filed at 10/08/2019 1000 Gross per 24 hour  Intake 726.91 ml  Output --  Net 726.91 ml   Filed Weights   10/07/19 1643 10/07/19 2111  Weight: 103 kg 90.2 kg    ROS: Review of Systems  Unable to perform ROS: Acuity of condition  Respiratory: Negative for shortness of breath.   Cardiovascular: Negative for chest pain.  Gastrointestinal: Negative for abdominal pain.  Musculoskeletal: Negative for joint pain.   Exam: Physical Exam  Constitutional: He appears lethargic.  HENT:  Nose: No mucosal edema.  Mouth/Throat: No oropharyngeal exudate or posterior oropharyngeal edema.  Eyes: Conjunctivae and lids are normal.  Neck: No JVD present. Carotid bruit is not present. No thyroid mass and no thyromegaly present.  Cardiovascular: S1 normal and S2 normal. Exam reveals no gallop.  No murmur heard. Pulses:      Dorsalis pedis pulses are 2+ on the right side and 2+ on the left side.  Respiratory: No respiratory distress. He has no wheezes. He has no rhonchi. He has no rales.  GI: Soft. Bowel sounds are normal. There is no abdominal tenderness.  Musculoskeletal:     Cervical back: No edema.     Right ankle: Swelling present.     Left ankle: Swelling present.  Lymphadenopathy:    He has no cervical adenopathy.  Neurological: He appears lethargic. No cranial nerve deficit.  Skin: Skin is warm. Nails show no  clubbing.  Chronic lower extremity discoloration  Psychiatric:  Answers some yes or no questions but went back asleep.      Data Reviewed: Basic Metabolic Panel: Recent Labs  Lab 10/07/19 1646 10/08/19 0427  NA 142 143  K 3.9 3.9  CL 103 106  CO2 28 30  GLUCOSE 138* 149*  BUN 26* 22  CREATININE 1.36* 1.22  CALCIUM 9.4 8.7*  MG  --  2.0  PHOS  --  4.1   Liver Function Tests: Recent Labs  Lab 10/07/19 1646 10/08/19 0427  AST 70* 64*  ALT 22 24  ALKPHOS 36* 31*  BILITOT 1.5* 1.6*  PROT 7.7 6.4*  ALBUMIN 3.8 3.2*   CBC: Recent Labs  Lab 10/07/19 1646 10/08/19 0427  WBC 9.5 9.7  NEUTROABS 7.1  --   HGB 14.9 13.3  HCT 48.3 42.3  MCV 83.7 83.4  PLT 93* 84*   Cardiac Enzymes: Recent Labs  Lab 10/07/19 1646 10/08/19 0427  CKTOTAL 2,536* 1,881*   BNP (last 3 results) Recent Labs    06/20/19 1559 10/07/19 1646  BNP 1,440.0* 1,312.0*      Recent Results (from the past 240 hour(s))  Respiratory Panel by RT PCR (Flu A&B, Covid) - Nasopharyngeal Swab     Status: None   Collection Time: 10/07/19  5:44 PM   Specimen: Nasopharyngeal Swab  Result Value Ref Range Status   SARS Coronavirus 2 by RT  PCR NEGATIVE NEGATIVE Final    Comment: (NOTE) SARS-CoV-2 target nucleic acids are NOT DETECTED. The SARS-CoV-2 RNA is generally detectable in upper respiratoy specimens during the acute phase of infection. The lowest concentration of SARS-CoV-2 viral copies this assay can detect is 131 copies/mL. A negative result does not preclude SARS-Cov-2 infection and should not be used as the sole basis for treatment or other patient management decisions. A negative result may occur with  improper specimen collection/handling, submission of specimen other than nasopharyngeal swab, presence of viral mutation(s) within the areas targeted by this assay, and inadequate number of viral copies (<131 copies/mL). A negative result must be combined with clinical observations,  patient history, and epidemiological information. The expected result is Negative. Fact Sheet for Patients:  PinkCheek.be Fact Sheet for Healthcare Providers:  GravelBags.it This test is not yet ap proved or cleared by the [place] FDA and  has been authorized for detection and/or diagnosis of SARS-CoV-2 by FDA under an Emergency Use Authorization (EUA). This EUA will remain  in effect (meaning this test can be used) for the duration of the COVID-19 declaration under Section 564(b)(1) of the Act, 21 U.S.C. section 360bbb-3(b)(1), unless the authorization is terminated or revoked sooner.    Influenza A by PCR NEGATIVE NEGATIVE Final   Influenza B by PCR NEGATIVE NEGATIVE Final    Comment: (NOTE) The Xpert Xpress SARS-CoV-2/FLU/RSV assay is intended as an aid in  the diagnosis of influenza from Nasopharyngeal swab specimens and  should not be used as a sole basis for treatment. Nasal washings and  aspirates are unacceptable for Xpert Xpress SARS-CoV-2/FLU/RSV  testing. Fact Sheet for Patients: PinkCheek.be Fact Sheet for Healthcare Providers: GravelBags.it This test is not yet approved or cleared by the [place] FDA and  has been authorized for detection and/or diagnosis of SARS-CoV-2 by  FDA under an Emergency Use Authorization (EUA). This EUA will remain  in effect (meaning this test can be used) for the duration of the  Covid-19 declaration under Section 564(b)(1) of the Act, 21  U.S.C. section 360bbb-3(b)(1), unless the authorization is  terminated or revoked. Performed at [facility], [address], [place], [place] [address]      Studies: DG Chest 1 View  Result Date: 10/07/2019 CLINICAL DATA:  Weakness. EXAM: CHEST  1 VIEW COMPARISON:  07/01/2018 FINDINGS: Heart size is borderline enlarged. Aortic calcifications are noted. There is a  persistent left-sided pleural effusion with bibasilar airspace disease. Vascular congestion without overt pulmonary edema is noted. There is no acute osseous abnormality. There is no pneumothorax. IMPRESSION: 1. Cardiomegaly with mild vascular congestion. 2. Persistent bibasilar airspace disease with a small left-sided pleural effusion. 3. Aortic atherosclerosis is noted. Electronically Signed   By: Constance Holster M.D.   On: 10/07/2019 16:49   CT Head Wo Contrast  Result Date: 10/07/2019 CLINICAL DATA:  RIGHT eye swelling and redness, found on couch in the same spot as yesterday, soaked with urine, encephalopathy, hypertension EXAM: CT HEAD WITHOUT CONTRAST TECHNIQUE: Contiguous axial images were obtained from the base of the skull through the vertex without intravenous contrast. Sagittal and coronal MPR images reconstructed from axial data set. COMPARISON:  07/20/2011 FINDINGS: Brain: Generalized atrophy. Normal ventricular morphology. No midline shift or mass effect. Small vessel chronic ischemic changes of deep cerebral white matter. No intracranial hemorrhage, mass lesion or evidence of acute infarction. No extra-axial fluid collections. Vascular: No hyperdense vessels Skull: Intact Sinuses/Orbits: Clear Other: N/A IMPRESSION: Atrophy with small vessel chronic ischemic  changes of deep cerebral white matter. No acute intracranial abnormalities. Electronically Signed   By: Lavonia Dana M.D.   On: 10/07/2019 17:38   CT ANGIO CHEST PE W OR WO CONTRAST  Result Date: 10/07/2019 CLINICAL DATA:  Failure to thrive, altered mental status and pneumonia. EXAM: CT ANGIOGRAPHY CHEST WITH CONTRAST TECHNIQUE: Multidetector CT imaging of the chest was performed using the standard protocol during bolus administration of intravenous contrast. Multiplanar CT image reconstructions and MIPs were obtained to evaluate the vascular anatomy. CONTRAST:  57mL OMNIPAQUE IOHEXOL 350 MG/ML SOLN COMPARISON:  None. FINDINGS:  Cardiovascular: Contrast injection is sufficient to demonstrate satisfactory opacification of the pulmonary arteries to the segmental level. There is a pulmonary embolus within the right lower lobar artery extending into the segmental branches. There is no evidence of right heart strain. No contralateral embolus. The size of the main pulmonary artery is normal. Heart size is normal, with no pericardial effusion. There is calcific aortic atherosclerosis. Aortic caliber is normal. Mediastinum/Nodes: No mediastinal, hilar or axillary lymphadenopathy. The visualized thyroid and thoracic esophageal course are unremarkable. Lungs/Pleura: Airways are patent. No focal consolidation, pulmonary infarct or pleural effusion. Upper Abdomen: Contrast bolus timing is not optimized for evaluation of the abdominal organs. The visualized portions of the organs of the upper abdomen are normal. Musculoskeletal: No chest wall abnormality. No bony spinal canal stenosis. Review of the MIP images confirms the above findings. IMPRESSION: 1. Right lower lobar pulmonary embolus extending into the segmental branches. 2. No evidence of right heart strain. 3. Critical Value/emergent results were called by telephone at the time of interpretation on 10/07/2019 at 11:00 pm to Dr. Toy Baker , who verbally acknowledged these results. Aortic Atherosclerosis (ICD10-I70.0). Electronically Signed   By: Ulyses Jarred M.D.   On: 10/07/2019 23:00   MR BRAIN WO CONTRAST  Result Date: 10/07/2019 CLINICAL DATA:  Focal neuro deficit, greater than 6 hours, stroke suspected. EXAM: MRI HEAD WITHOUT CONTRAST TECHNIQUE: Multiplanar, multiecho pulse sequences of the brain and surrounding structures were obtained without intravenous contrast. COMPARISON:  Head CT 10/07/2019, head CT 07/21/2011 FINDINGS: Brain: There is a subcentimeter focus of diffusion weighted hyperintensity within the right temporal periatrial white matter (series 5, image 22) without  ADC correlate. Findings likely reflect a small focus of subacute ischemia. Otherwise, no evidence of acute infarct. There is no evidence of intracranial mass. No midline shift or extra-axial fluid collection. Moderate patchy and confluent T2/FLAIR hyperintensity within the cerebral white matter is nonspecific, but consistent with chronic small vessel ischemic disease. Chronic small vessel ischemic changes are also present within the thalami, bilateral basal ganglia and pons. Chronic microhemorrhages within the right thalamus and right cerebellum. Advanced generalized parenchymal atrophy. Vascular: Flow voids maintained within the proximal large arterial vessels. Skull and upper cervical spine: No focal marrow lesion Sinuses/Orbits: Bilateral lens replacements. Mild mucosal thickening within the bilateral ethmoid and right maxillary sinuses. Small left mastoid effusion. Trace fluid also present within right mastoid air cells. IMPRESSION: Subcentimeter focus of diffusion weighted hyperintensity within the right temporal periatrial white matter likely reflecting subacute ischemia. Moderate chronic small vessel ischemic disease. Advanced generalized parenchymal atrophy Small left mastoid effusion. Electronically Signed   By: Kellie Simmering DO   On: 10/07/2019 22:52   US Venous Img Lower Bilateral (DVT)  Result Date: 10/08/2019 CLINICAL DATA:  Acute PE by CTA EXAM: BILATERAL LOWER EXTREMITY VENOUS DOPPLER ULTRASOUND TECHNIQUE: Gray-scale sonography with graded compression, as well as color Doppler and duplex ultrasound were performed to evaluate  the lower extremity deep venous systems from the level of the common femoral vein and including the common femoral, femoral, profunda femoral, popliteal and calf veins including the posterior tibial, peroneal and gastrocnemius veins when visible. The superficial great saphenous vein was also interrogated. Spectral Doppler was utilized to evaluate flow at rest and with distal  augmentation maneuvers in the common femoral, femoral and popliteal veins. COMPARISON:  None. FINDINGS: RIGHT LOWER EXTREMITY Common Femoral Vein: No evidence of thrombus. Normal compressibility, respiratory phasicity and response to augmentation. Saphenofemoral Junction: No evidence of thrombus. Normal compressibility and flow on color Doppler imaging. Profunda Femoral Vein: No evidence of thrombus. Normal compressibility and flow on color Doppler imaging. Femoral Vein: Distal femoral vein is noncompressible intraluminal hypoechoic thrombus appearing occlusive. Popliteal Vein: Occlusive popliteal thrombus present. Vessel is noncompressible. Calf Veins: Thrombus also extends into the calf tibial and peroneal veins appearing occlusive and noncompressible. LEFT LOWER EXTREMITY Common Femoral Vein: No evidence of thrombus. Normal compressibility, respiratory phasicity and response to augmentation. Saphenofemoral Junction: No evidence of thrombus. Normal compressibility and flow on color Doppler imaging. Profunda Femoral Vein: No evidence of thrombus. Normal compressibility and flow on color Doppler imaging. Femoral Vein: No evidence of thrombus. Normal compressibility, respiratory phasicity and response to augmentation. Popliteal Vein: Hypoechoic intraluminal thrombus. Thrombus appears nonocclusive. Vessel is partially compressible. Calf Veins: Popliteal DVT does appear to extend into the tibial and peroneal veins appearing occlusive and noncompressible. IMPRESSION: Positive exam for bilateral DVT as above, slightly worse on the right. Electronically Signed   By: Jerilynn Mages.  Shick M.D.   On: 10/08/2019 08:58    Scheduled Meds: . ammonium lactate  1 application Topical BID  . apixaban  10 mg Oral BID   Followed by  . [START ON 10/15/2019] apixaban  5 mg Oral BID  . diltiazem  240 mg Oral Daily  . feeding supplement (NEPRO CARB STEADY)  237 mL Oral TID BM  . [START ON 10/09/2019] multivitamin with minerals  1 tablet Oral  Daily  . pantoprazole  40 mg Oral Daily  . tamsulosin  0.4 mg Oral Daily   Continuous Infusions: . sodium chloride 50 mL/hr at 10/08/19 1154    Assessment/Plan:  1. Acute pulmonary embolism with bilateral lower extremity DVT.  Discontinue heparin drip and start Eliquis treatment. 2. Acute rhabdomyolysis get rid of statin and gentle IV fluid hydration decreased to 50 an hour. 3. Chronic diastolic congestive heart failure.  Repeat echocardiogram pending.  No signs of heart failure currently.  Holding Lasix and gentle IV fluids. 4. Episodes of bradycardia on monitor hold the diltiazem. 5. BPH on Flomax 6. Weakness.  Physical therapy evaluation 7. Hyperlipidemia unspecified hold statin 8. Severe protein calorie malnutrition 9. Thrombocytopenia.  Continue to trend.  Code Status:     Code Status Orders  (From admission, onward)         Start     Ordered   10/07/19 2119  Full code  Continuous     10/07/19 2118        Code Status History    Date Active Date Inactive Code Status Order ID Comments User Context   06/28/2018 2242 07/03/2018 1450 Full Code [id number]  Vickie Epley, MD ED   03/14/2016 2223 03/15/2016 2224 Full Code [id number]  Gonczy, [place], MD ED   03/06/2016 1022 03/11/2016 1751 Full Code [id number]  Hubbard Robinson, MD ED   Advance Care Planning Activity     Family Communication: Tried to reach wife on the phone  Disposition Plan: Need to see how he does with physical therapy.  Would like to see CPK come down further.  Time spent: 28 minutes  [place]

## 2019-10-08 NOTE — Progress Notes (Signed)
PT Cancellation Note  Patient Details Name: Keith A Briel Sr. MRN: [id number] DOB: 17-Oct-1930   Cancelled Treatment:    Reason Eval/Treat Not Completed: Medical issues which prohibited therapy. Consult received, chart reviewed. CT chest showed small PE. Heparin initiated on 1/25, 0037. Dopplers of bilateral LEs positive for Bil DVT. Will hold PT evaluation at this time and re-attempt at later date/time as medically appropriate.   Zachary George PT, DPT 11:15 AM,10/08/19 [phone]

## 2019-10-08 NOTE — Progress Notes (Signed)
Initial Nutrition Assessment  DOCUMENTATION CODES:   Severe malnutrition in context of acute illness/injury  INTERVENTION:   Nepro Shake po TID, each supplement provides 425 kcal and 19 grams protein  Magic cup TID with meals, each supplement provides 290 kcal and 9 grams of protein  MVI daily   Pt likely at refeed risk; recommend monitor K, Mg and P labs as oral intake improves.   NUTRITION DIAGNOSIS:   Severe Malnutrition related to acute illness as evidenced by 9 percent weight loss in 1 month, moderate to severe fat depletions, moderate muscle depletion.  GOAL:   Patient will meet greater than or equal to 90% of their needs  MONITOR:   PO intake, Supplement acceptance, Labs, Weight trends, Skin, I & O's  REASON FOR ASSESSMENT:   Consult Assessment of nutrition requirement/status  ASSESSMENT:   86 y.o. male with medical history significant of diverticulitis, hyperlipidemia, hypertension, aspiration pneumonia, urinary incontinence, SBO, diastolic CHF, CKD who is admitted with weakness and FTT   Met with pt and pt's wife in room today. Pt sleeping at time of RD visit so history obtained from pt's wife. Wife reports pt with poor appetite and oral intake for the past month. Wife also reports a significant weight loss over the past month. Per chart, pt has lost 33lbs over the past year and 18lbs(9%) over the past month which is significant. Per wife, pt ate only a few bites of his lunch today. RD will add supplements and MVI to help pt meet his estimated needs. Pt seen by SLP today and placed on a dysphagia 3/nectar thick diet; will add Nepro and Magic Cups. Suspect pt is at high refeed risk.    Medications reviewed and include: protonix, NaCl @50ml /hr  Labs reviewed: K 3.9 wnl, P 4.1 wnl, Mg 2.0 wnl, AST 64(H), tbili 1.6(H), prealbumin 14.4(L)  NUTRITION - FOCUSED PHYSICAL EXAM:    Most Recent Value  Orbital Region  Mild depletion  Upper Arm Region  Severe depletion   Thoracic and Lumbar Region  Mild depletion  Buccal Region  Mild depletion  Temple Region  Moderate depletion  Clavicle Bone Region  Mild depletion  Clavicle and Acromion Bone Region  Mild depletion  Scapular Bone Region  Unable to assess  Dorsal Hand  No depletion  Patellar Region  Moderate depletion  Anterior Thigh Region  Moderate depletion  Posterior Calf Region  Moderate depletion  Edema (RD Assessment)  Moderate  Hair  Reviewed  Eyes  Reviewed  Mouth  Reviewed  Skin  Reviewed  Nails  Reviewed     Diet Order:   Diet Order            DIET DYS 3 Room service appropriate? Yes; Fluid consistency: Nectar Thick  Diet effective now             EDUCATION NEEDS:   Education needs have been addressed(with pt's wife)  Skin:  Skin Assessment: Reviewed RN Assessment(MASD)  Last BM:  PTA  Height:   Ht Readings from Last 1 Encounters:  10/07/19 6' (1.829 m)    Weight:   Wt Readings from Last 1 Encounters:  10/07/19 90.2 kg    Ideal Body Weight:  80.9 kg  BMI:  Body mass index is 26.96 kg/m.  Estimated Nutritional Needs:   Kcal:  2100-2400kcal/day  Protein:  105-120g/day  Fluid:  >2L/day  Koleen Distance MS, RD, LDN Pager #- [phone] Office#- [phone] After Hours Pager: [phone]

## 2019-10-08 NOTE — Progress Notes (Signed)
Patient had a mews score of 2. It is not an acute change. Per previous RN patient was alert to voice. Patient is arousable and conversational. HR has been brady. Will reassess patient.

## 2019-10-09 DIAGNOSIS — I824Y3 Acute embolism and thrombosis of unspecified deep veins of proximal lower extremity, bilateral: Secondary | ICD-10-CM

## 2019-10-09 LAB — BASIC METABOLIC PANEL
Anion gap: 10 (ref 5–15)
BUN: 20 mg/dL (ref 8–23)
CO2: 25 mmol/L (ref 22–32)
Calcium: 8.5 mg/dL — ABNORMAL LOW (ref 8.9–10.3)
Chloride: 108 mmol/L (ref 98–111)
Creatinine, Ser: 0.97 mg/dL (ref 0.61–1.24)
GFR calc Af Amer: >60 mL/min (ref >60)
GFR calc non Af Amer: >60 mL/min (ref >60)
Glucose, Bld: 110 mg/dL — ABNORMAL HIGH (ref 70–99)
Potassium: 3.6 mmol/L (ref 3.5–5.1)
Sodium: 143 mmol/L (ref 135–145)

## 2019-10-09 LAB — CK: Total CK: 665 U/L — ABNORMAL HIGH (ref 49–397)

## 2019-10-09 LAB — HEMOGLOBIN: Hemoglobin: 12.6 g/dL — ABNORMAL LOW (ref 13.0–17.0)

## 2019-10-09 MED ORDER — MORPHINE SULFATE (PF) 2 MG/ML IV SOLN
1.0000 mg | Freq: Once | INTRAVENOUS | Status: AC
  Administered 2019-10-09: 1 mg via INTRAVENOUS
  Filled 2019-10-09: qty 1

## 2019-10-09 MED ORDER — HALOPERIDOL LACTATE 5 MG/ML IJ SOLN
2.0000 mg | Freq: Four times a day (QID) | INTRAMUSCULAR | Status: DC | PRN
  Administered 2019-10-09 – 2019-10-10 (×2): 2 mg via INTRAMUSCULAR
  Filled 2019-10-09 (×2): qty 1

## 2019-10-09 MED ORDER — SODIUM CHLORIDE 0.9 % IV SOLN: INTRAVENOUS | Status: DC

## 2019-10-09 NOTE — Progress Notes (Signed)
Speech Language Pathology Treatment: Dysphagia  Patient Details Name: Keith A Litton Sr. MRN: [id number] DOB: 08/17/1931 Today's Date: 10/09/2019 Time: 1230-1310 SLP Time Calculation (min) (ACUTE ONLY): 40 min  Assessment / Plan / Recommendation Clinical Impression  Pt seen today for ongoing assessment of swallowing function; trials to upgrade diet consistency as indicated. Pt continues to present w/ Confusion and declined Cognitive status; constant verbal output despite verbal cues to NOT talk w/ food in his mouth. Pt is wearing Mitts as restraints d/t pulling at lines/IV pole per NSG. Mod verbal/tactile cues were required for follow through w/ tasks during meal. Noted decreased awareness for tasks overall which can impact safety w/ oral intake. NSG reported pt only taking 2-3 bites/sips then refusing further at meals. Pt was awake, given cues and support to sit fully upright for oral intake, Lunch meal. Pt give verbal/tactile cues during po trials. Pt given trials of ice chips and thin liquids via Cup w/ IMMEDIATE, OVERT COUGHING and WET VOCAL QUALITY noted consistently w/ trials - pt required time to calm coughing. He was then presented trials of Nectar liquids via Cup and Straw w/ No further immediate, overt s/s of aspiration noted during/post intake - similar noted w/ the trials of Puree and Soft Solids of Lunch meal. Pt required Mod+ verbal cues to continue to try bites of foods at meal - he often refused saying something was "wrong" w/ each food item despite use of condiments and salt/pepper to season. He consumed a few more bites of diced peaches then Magic Cup ice cream and finished a Nectar liquids w/ SLP giving Mod+ support w/ feeding. Alternating b/t food/liquid was encouraged to aid oral clearing. He demonstrated adequate oral awareness during oral prep stage accepting all trials. Oral phase c/b grossly timely bolus management then A-P transfer for swallowing though min increased in Time w/  increased textured foods. W/ gentle verbal cues and Time b/t trials, pt cleared orally post bites of foods. He appeared to enjoy the sips of Nectar liquids(Apple juice) via Straw finishing 1 full container. Puree and Nectar liquids were alternated to promote oral clearing as well. No further thin liquids were given during this session d/t Cognitive status, overt Dysphagia w/ thin liquids, and increased risk for aspiration/choking during intake. OF NOTE, PT STATED HE COUGHED "A LOT AT HOME" DURING MEALS - HIS WIFE WOULD "ASK ME WHY I COUGH SO MUCH WHEN I EAT AND DRINK AT MY MEALS". Unsure of the timeframe of this presentation but noted the subacute nature on pt's MRI.  Recommend continue a Dysphagia 3 (mech soft) diet with NECTAR consistency liquids; aspiration precautions including alternating liquids/foods for oral clearing; reducing environmental distractions during meals; Recommend Pills in Puree - Crushed for safer swallowing d/t Cognitive decline. ST services will continue to f/u w/ objective assessment as indicated. Recommend a Palliative Care consult for Keith Bryant. MD/NSG updated.     HPI HPI: Pt is a 86 y.o. male with medical history significant of diverticulitis, hyperlipidemia, hypertension, aspiration pneumonia, urinary incontinence, SBO, diastolic CHF.  Suspect Cognitive decline baseline.  Presented with generalized weakness apparently yesterday EMS was called out to his house because he could not ambulate.  They got him up and set him in his arm chair.  They checked on him again in 24 hours he remained in the same position and has urinated on himself but was unable to get up to go to the bathroom.  At admission, MRI of brain revealed: "Advanced generalized parenchymal atrophy and Moderate  chronic small vessel ischemic disease; Right temporal periatrial white matter likely reflecting subacute Ischemia".  CXR: "Persistent bibasilar airspace disease with a small left-sided pleural effusion; cardiomegaly".   Pt endorsed coughing at meals at home but could not given further details d/t overt Cognitive decline in his engagement w/ SLP.  Pt wearing Mitts as restraints.       SLP Plan  Continue with current plan of care(MBSS TBD next 1-2 days)       Recommendations  Diet recommendations: Dysphagia 3 (mechanical soft);Nectar-thick liquid(minced meats) Liquids provided via: Cup;Straw Medication Administration: Whole meds with puree(vs Crushed in Puree for safer swallowing) Supervision: Patient able to self feed;Staff to assist with self feeding;Full supervision/cueing for compensatory strategies(Cognitive decline) Compensations: Minimize environmental distractions;Slow rate;Small sips/bites;Lingual sweep for clearance of pocketing;Multiple dry swallows after each bite/sip;Follow solids with liquid Postural Changes and/or Swallow Maneuvers: Seated upright 90 degrees;Upright 30-60 min after meal                General recommendations: (Dietician f/u; Palliative Care f/u) Oral Care Recommendations: Oral care BID;Oral care before and after PO;Staff/trained caregiver to provide oral care Follow up Recommendations: Skilled Nursing facility(TBD) SLP Visit Diagnosis: Dysphagia, oropharyngeal phase (R13.12) Plan: Continue with current plan of care(MBSS TBD next 1-2 days)       GO               Orinda Kenner, MS, CCC-SLP Din Bookwalter 10/09/2019, 3:24 PM

## 2019-10-09 NOTE — Progress Notes (Signed)
Patient ID: Keith A Srinivasan Sr., male   DOB: [birth year], 86 y.o.   MRN: [id number] [facility] PROGRESS NOTE  Rance Friedland [id number] DOB: 1931/06/11 DOA: 10/07/2019 PCP: Leonel Ramsay, MD  HPI/Subjective: Patient's much more alert today than yesterday.  Answers some questions.  He was able to give me a phone number for his wife.  Still feels weak but able to do straight leg raise.  No complaints of chest pain or shortness of breath.  Objective: Vitals:   10/09/19 0415 10/09/19 0803  BP: (!) 148/48 (!) 154/57  Pulse: (!) 57 60  Resp: 16 14  Temp: 98.9 F (37.2 C) 97.9 F (36.6 C)  SpO2: 95% 95%    Intake/Output Summary (Last 24 hours) at 10/09/2019 1631 Last data filed at 10/09/2019 1336 Gross per 24 hour  Intake 1697.13 ml  Output 200 ml  Net 1497.13 ml   Filed Weights   10/07/19 1643 10/07/19 2111 10/09/19 0415  Weight: 103 kg 90.2 kg 90.9 kg    ROS: Review of Systems  Unable to perform ROS: Acuity of condition  Constitutional: Positive for malaise/fatigue.  Respiratory: Negative for shortness of breath.   Cardiovascular: Negative for chest pain.  Gastrointestinal: Negative for abdominal pain.  Musculoskeletal: Negative for joint pain.   Exam: Physical Exam  HENT:  Nose: No mucosal edema.  Mouth/Throat: No oropharyngeal exudate or posterior oropharyngeal edema.  Eyes: Conjunctivae and lids are normal.  Neck: Carotid bruit is not present.  Cardiovascular: S1 normal and S2 normal. Exam reveals no gallop.  Murmur heard.  Systolic murmur is present with a grade of 3/6. Respiratory: No respiratory distress. He has decreased breath sounds in the right lower field and the left lower field. He has no wheezes. He has no rhonchi. He has no rales.  GI: Soft. Bowel sounds are normal. There is no abdominal tenderness.  Musculoskeletal:     Right ankle: Swelling present.     Left ankle: Swelling present.  Lymphadenopathy:    He has no cervical adenopathy.   Neurological: He is alert.  Able to straight leg raise.  Follow simple commands  Skin: Skin is warm. Nails show no clubbing.  Chronic lower extremity discoloration  Psychiatric: He has a normal mood and affect.      Data Reviewed: Basic Metabolic Panel: Recent Labs  Lab 10/07/19 1646 10/08/19 0427 10/09/19 0529  NA 142 143 143  K 3.9 3.9 3.6  CL 103 106 108  CO2 28 30 25   GLUCOSE 138* 149* 110*  BUN 26* 22 20  CREATININE 1.36* 1.22 0.97  CALCIUM 9.4 8.7* 8.5*  MG  --  2.0  --   PHOS  --  4.1  --    Liver Function Tests: Recent Labs  Lab 10/07/19 1646 10/08/19 0427  AST 70* 64*  ALT 22 24  ALKPHOS 36* 31*  BILITOT 1.5* 1.6*  PROT 7.7 6.4*  ALBUMIN 3.8 3.2*   CBC: Recent Labs  Lab 10/07/19 1646 10/08/19 0427 10/09/19 0529  WBC 9.5 9.7  --   NEUTROABS 7.1  --   --   HGB 14.9 13.3 12.6*  HCT 48.3 42.3  --   MCV 83.7 83.4  --   PLT 93* 84*  --    Cardiac Enzymes: Recent Labs  Lab 10/07/19 1646 10/08/19 0427 10/09/19 0529  CKTOTAL 2,536* 1,881* 665*   BNP (last 3 results) Recent Labs    06/20/19 1559 10/07/19 1646  BNP 1,440.0* 1,312.0*  Recent Results (from the past 240 hour(s))  Respiratory Panel by RT PCR (Flu A&B, Covid) - Nasopharyngeal Swab     Status: None   Collection Time: 10/07/19  5:44 PM   Specimen: Nasopharyngeal Swab  Result Value Ref Range Status   SARS Coronavirus 2 by RT PCR NEGATIVE NEGATIVE Final    Comment: (NOTE) SARS-CoV-2 target nucleic acids are NOT DETECTED. The SARS-CoV-2 RNA is generally detectable in upper respiratoy specimens during the acute phase of infection. The lowest concentration of SARS-CoV-2 viral copies this assay can detect is 131 copies/mL. A negative result does not preclude SARS-Cov-2 infection and should not be used as the sole basis for treatment or other patient management decisions. A negative result may occur with  improper specimen collection/handling, submission of specimen  other than nasopharyngeal swab, presence of viral mutation(s) within the areas targeted by this assay, and inadequate number of viral copies (<131 copies/mL). A negative result must be combined with clinical observations, patient history, and epidemiological information. The expected result is Negative. Fact Sheet for Patients:  PinkCheek.be Fact Sheet for Healthcare Providers:  GravelBags.it This test is not yet ap proved or cleared by the [place] FDA and  has been authorized for detection and/or diagnosis of SARS-CoV-2 by FDA under an Emergency Use Authorization (EUA). This EUA will remain  in effect (meaning this test can be used) for the duration of the COVID-19 declaration under Section 564(b)(1) of the Act, 21 U.S.C. section 360bbb-3(b)(1), unless the authorization is terminated or revoked sooner.    Influenza A by PCR NEGATIVE NEGATIVE Final   Influenza B by PCR NEGATIVE NEGATIVE Final    Comment: (NOTE) The Xpert Xpress SARS-CoV-2/FLU/RSV assay is intended as an aid in  the diagnosis of influenza from Nasopharyngeal swab specimens and  should not be used as a sole basis for treatment. Nasal washings and  aspirates are unacceptable for Xpert Xpress SARS-CoV-2/FLU/RSV  testing. Fact Sheet for Patients: PinkCheek.be Fact Sheet for Healthcare Providers: GravelBags.it This test is not yet approved or cleared by the [place] FDA and  has been authorized for detection and/or diagnosis of SARS-CoV-2 by  FDA under an Emergency Use Authorization (EUA). This EUA will remain  in effect (meaning this test can be used) for the duration of the  Covid-19 declaration under Section 564(b)(1) of the Act, 21  U.S.C. section 360bbb-3(b)(1), unless the authorization is  terminated or revoked. Performed at [facility], [address] [place], [place]  [address]      Studies: DG Chest 1 View  Result Date: 10/07/2019 CLINICAL DATA:  Weakness. EXAM: CHEST  1 VIEW COMPARISON:  07/01/2018 FINDINGS: Heart size is borderline enlarged. Aortic calcifications are noted. There is a persistent left-sided pleural effusion with bibasilar airspace disease. Vascular congestion without overt pulmonary edema is noted. There is no acute osseous abnormality. There is no pneumothorax. IMPRESSION: 1. Cardiomegaly with mild vascular congestion. 2. Persistent bibasilar airspace disease with a small left-sided pleural effusion. 3. Aortic atherosclerosis is noted. Electronically Signed   By: Constance Holster M.D.   On: 10/07/2019 16:49   CT Head Wo Contrast  Result Date: 10/07/2019 CLINICAL DATA:  RIGHT eye swelling and redness, found on couch in the same spot as yesterday, soaked with urine, encephalopathy, hypertension EXAM: CT HEAD WITHOUT CONTRAST TECHNIQUE: Contiguous axial images were obtained from the base of the skull through the vertex without intravenous contrast. Sagittal and coronal MPR images reconstructed from axial data set. COMPARISON:  07/20/2011 FINDINGS: Brain: Generalized  atrophy. Normal ventricular morphology. No midline shift or mass effect. Small vessel chronic ischemic changes of deep cerebral white matter. No intracranial hemorrhage, mass lesion or evidence of acute infarction. No extra-axial fluid collections. Vascular: No hyperdense vessels Skull: Intact Sinuses/Orbits: Clear Other: N/A IMPRESSION: Atrophy with small vessel chronic ischemic changes of deep cerebral white matter. No acute intracranial abnormalities. Electronically Signed   By: Lavonia Dana M.D.   On: 10/07/2019 17:38   CT ANGIO CHEST PE W OR WO CONTRAST  Result Date: 10/07/2019 CLINICAL DATA:  Failure to thrive, altered mental status and pneumonia. EXAM: CT ANGIOGRAPHY CHEST WITH CONTRAST TECHNIQUE: Multidetector CT imaging of the chest was performed using the standard protocol during  bolus administration of intravenous contrast. Multiplanar CT image reconstructions and MIPs were obtained to evaluate the vascular anatomy. CONTRAST:  69mL OMNIPAQUE IOHEXOL 350 MG/ML SOLN COMPARISON:  None. FINDINGS: Cardiovascular: Contrast injection is sufficient to demonstrate satisfactory opacification of the pulmonary arteries to the segmental level. There is a pulmonary embolus within the right lower lobar artery extending into the segmental branches. There is no evidence of right heart strain. No contralateral embolus. The size of the main pulmonary artery is normal. Heart size is normal, with no pericardial effusion. There is calcific aortic atherosclerosis. Aortic caliber is normal. Mediastinum/Nodes: No mediastinal, hilar or axillary lymphadenopathy. The visualized thyroid and thoracic esophageal course are unremarkable. Lungs/Pleura: Airways are patent. No focal consolidation, pulmonary infarct or pleural effusion. Upper Abdomen: Contrast bolus timing is not optimized for evaluation of the abdominal organs. The visualized portions of the organs of the upper abdomen are normal. Musculoskeletal: No chest wall abnormality. No bony spinal canal stenosis. Review of the MIP images confirms the above findings. IMPRESSION: 1. Right lower lobar pulmonary embolus extending into the segmental branches. 2. No evidence of right heart strain. 3. Critical Value/emergent results were called by telephone at the time of interpretation on 10/07/2019 at 11:00 pm to Dr. Toy Baker , who verbally acknowledged these results. Aortic Atherosclerosis (ICD10-I70.0). Electronically Signed   By: Ulyses Jarred M.D.   On: 10/07/2019 23:00   MR BRAIN WO CONTRAST  Result Date: 10/07/2019 CLINICAL DATA:  Focal neuro deficit, greater than 6 hours, stroke suspected. EXAM: MRI HEAD WITHOUT CONTRAST TECHNIQUE: Multiplanar, multiecho pulse sequences of the brain and surrounding structures were obtained without intravenous contrast.  COMPARISON:  Head CT 10/07/2019, head CT 07/21/2011 FINDINGS: Brain: There is a subcentimeter focus of diffusion weighted hyperintensity within the right temporal periatrial white matter (series 5, image 22) without ADC correlate. Findings likely reflect a small focus of subacute ischemia. Otherwise, no evidence of acute infarct. There is no evidence of intracranial mass. No midline shift or extra-axial fluid collection. Moderate patchy and confluent T2/FLAIR hyperintensity within the cerebral white matter is nonspecific, but consistent with chronic small vessel ischemic disease. Chronic small vessel ischemic changes are also present within the thalami, bilateral basal ganglia and pons. Chronic microhemorrhages within the right thalamus and right cerebellum. Advanced generalized parenchymal atrophy. Vascular: Flow voids maintained within the proximal large arterial vessels. Skull and upper cervical spine: No focal marrow lesion Sinuses/Orbits: Bilateral lens replacements. Mild mucosal thickening within the bilateral ethmoid and right maxillary sinuses. Small left mastoid effusion. Trace fluid also present within right mastoid air cells. IMPRESSION: Subcentimeter focus of diffusion weighted hyperintensity within the right temporal periatrial white matter likely reflecting subacute ischemia. Moderate chronic small vessel ischemic disease. Advanced generalized parenchymal atrophy Small left mastoid effusion. Electronically Signed   By: Marylyn Ishihara  Golden DO   On: 10/07/2019 22:52   US Venous Img Lower Bilateral (DVT)  Result Date: 10/08/2019 CLINICAL DATA:  Acute PE by CTA EXAM: BILATERAL LOWER EXTREMITY VENOUS DOPPLER ULTRASOUND TECHNIQUE: Gray-scale sonography with graded compression, as well as color Doppler and duplex ultrasound were performed to evaluate the lower extremity deep venous systems from the level of the common femoral vein and including the common femoral, femoral, profunda femoral, popliteal and calf  veins including the posterior tibial, peroneal and gastrocnemius veins when visible. The superficial great saphenous vein was also interrogated. Spectral Doppler was utilized to evaluate flow at rest and with distal augmentation maneuvers in the common femoral, femoral and popliteal veins. COMPARISON:  None. FINDINGS: RIGHT LOWER EXTREMITY Common Femoral Vein: No evidence of thrombus. Normal compressibility, respiratory phasicity and response to augmentation. Saphenofemoral Junction: No evidence of thrombus. Normal compressibility and flow on color Doppler imaging. Profunda Femoral Vein: No evidence of thrombus. Normal compressibility and flow on color Doppler imaging. Femoral Vein: Distal femoral vein is noncompressible intraluminal hypoechoic thrombus appearing occlusive. Popliteal Vein: Occlusive popliteal thrombus present. Vessel is noncompressible. Calf Veins: Thrombus also extends into the calf tibial and peroneal veins appearing occlusive and noncompressible. LEFT LOWER EXTREMITY Common Femoral Vein: No evidence of thrombus. Normal compressibility, respiratory phasicity and response to augmentation. Saphenofemoral Junction: No evidence of thrombus. Normal compressibility and flow on color Doppler imaging. Profunda Femoral Vein: No evidence of thrombus. Normal compressibility and flow on color Doppler imaging. Femoral Vein: No evidence of thrombus. Normal compressibility, respiratory phasicity and response to augmentation. Popliteal Vein: Hypoechoic intraluminal thrombus. Thrombus appears nonocclusive. Vessel is partially compressible. Calf Veins: Popliteal DVT does appear to extend into the tibial and peroneal veins appearing occlusive and noncompressible. IMPRESSION: Positive exam for bilateral DVT as above, slightly worse on the right. Electronically Signed   By: Jerilynn Mages.  Shick M.D.   On: 10/08/2019 08:58   ECHOCARDIOGRAM COMPLETE  Result Date: 10/08/2019   ECHOCARDIOGRAM REPORT   Patient Name:   Yanis A  Peregoy Sr. Date of Exam: 10/08/2019 Medical Rec #:  [id number]         Height:       72.0 in Accession #:    [id number]        Weight:       198.8 lb Date of Birth:  14-Nov-1930          BSA:          2.13 m Patient Age:    73 years          BP:           164/45 mmHg Patient Gender: M                 HR:           59 bpm. Exam Location:  [facility] Procedure: 2D Echo, Color Doppler and Cardiac Doppler Indications:     Elevated troponin  History:         Patient has prior history of Echocardiogram examinations, most                  recent 07/03/2019. Risk Factors:Hypertension.  Sonographer:     [name] RDCS (AE) Referring Phys:  [id number] Loletha Grayer Diagnosing Phys: Serafina Royals MD IMPRESSIONS  1. Left ventricular ejection fraction, by visual estimation, is 50 to 55%. The left ventricle has normal function. There is no left ventricular hypertrophy.  2. The left ventricle has no regional wall motion abnormalities.  3.  Global right ventricle has normal systolic function.The right ventricular size is normal. No increase in right ventricular wall thickness.  4. Left atrial size was normal.  5. Right atrial size was normal.  6. The mitral valve is normal in structure. Mild mitral valve regurgitation.  7. The tricuspid valve is normal in structure.  8. The tricuspid valve is normal in structure. Tricuspid valve regurgitation is mild.  9. The aortic valve is normal in structure. Aortic valve regurgitation is trivial. Mild aortic valve stenosis. 10. The pulmonic valve was normal in structure. Pulmonic valve regurgitation is not visualized. 11. Mildly elevated pulmonary artery systolic pressure. FINDINGS  Left Ventricle: Left ventricular ejection fraction, by visual estimation, is 50 to 55%. The left ventricle has normal function. The left ventricle has no regional wall motion abnormalities. There is no left ventricular hypertrophy. Right Ventricle: The right ventricular size is normal. No increase in right ventricular wall  thickness. Global RV systolic function is has normal systolic function. The tricuspid regurgitant velocity is 2.58 m/s, and with an assumed right atrial pressure  of 10 mmHg, the estimated right ventricular systolic pressure is mildly elevated at 36.6 mmHg. Left Atrium: Left atrial size was normal in size. Right Atrium: Right atrial size was normal in size Pericardium: There is no evidence of pericardial effusion. Mitral Valve: The mitral valve is normal in structure. Mild mitral valve regurgitation. Tricuspid Valve: The tricuspid valve is normal in structure. Tricuspid valve regurgitation is mild. Aortic Valve: The aortic valve is normal in structure. Aortic valve regurgitation is trivial. Aortic regurgitation PHT measures 498 msec. Mild aortic stenosis is present. Aortic valve mean gradient measures 10.0 mmHg. Aortic valve peak gradient measures 20.9 mmHg. Aortic valve area, by VTI measures 1.84 cm. Pulmonic Valve: The pulmonic valve was normal in structure. Pulmonic valve regurgitation is not visualized. Pulmonic regurgitation is not visualized. Aorta: The aortic root, ascending aorta and aortic arch are all structurally normal, with no evidence of dilitation or obstruction. IAS/Shunts: No atrial level shunt detected by color flow Doppler.  LEFT VENTRICLE PLAX 2D LVIDd:         3.94 cm  Diastology LVIDs:         2.59 cm  LV e' lateral:   4.90 cm/s LV PW:         1.50 cm  LV E/e' lateral: 13.4 LV IVS:        1.74 cm  LV e' medial:    4.46 cm/s LVOT diam:     2.00 cm  LV E/e' medial:  14.7 LV SV:         43 ml LV SV Index:   20.04 LVOT Area:     3.14 cm  RIGHT VENTRICLE RV Basal diam:  3.86 cm RV S prime:     16.50 cm/s TAPSE (M-mode): 4.7 cm LEFT ATRIUM             Index       RIGHT ATRIUM           Index LA diam:        4.40 cm 2.07 cm/m  RA Area:     23.60 cm LA Vol (A2C):   48.5 ml 22.82 ml/m RA Volume:   68.10 ml  32.05 ml/m LA Vol (A4C):   71.8 ml 33.79 ml/m LA Biplane Vol: 64.1 ml 30.16 ml/m  AORTIC  VALVE                    PULMONIC VALVE  AV Area (Vmax):    1.50 cm     PV Vmax:        0.78 m/s AV Area (Vmean):   1.82 cm     PV Peak grad:   2.4 mmHg AV Area (VTI):     1.84 cm     RVOT Peak grad: 3 mmHg AV Vmax:           228.50 cm/s AV Vmean:          141.000 cm/s AV VTI:            0.430 m AV Peak Grad:      20.9 mmHg AV Mean Grad:      10.0 mmHg LVOT Vmax:         109.00 cm/s LVOT Vmean:        81.600 cm/s LVOT VTI:          0.252 m LVOT/AV VTI ratio: 0.59 AI PHT:            498 msec MITRAL VALVE                        TRICUSPID VALVE MV Area (PHT): 3.00 cm             TR Peak grad:   26.6 mmHg MV PHT:        73.37 msec           TR Vmax:        258.00 cm/s MV Decel Time: 253 msec MV E velocity: 65.50 cm/s 103 cm/s  SHUNTS MV A velocity: 96.50 cm/s 70.3 cm/s Systemic VTI:  0.25 m MV E/A ratio:  0.68       1.5       Systemic Diam: 2.00 cm  Serafina Royals MD Electronically signed by Serafina Royals MD Signature Date/Time: 10/08/2019/4:47:15 PM    Final     Scheduled Meds: . ammonium lactate  1 application Topical BID  . apixaban  10 mg Oral BID   Followed by  . [START ON 10/15/2019] apixaban  5 mg Oral BID  . feeding supplement (NEPRO CARB STEADY)  237 mL Oral TID BM  . multivitamin with minerals  1 tablet Oral Daily  . pantoprazole  40 mg Oral Daily  . tamsulosin  0.4 mg Oral Daily   Continuous Infusions: . sodium chloride      Assessment/Plan:  1. Acute pulmonary embolism with bilateral lower extremity DVT.  Continue Eliquis treatment. 2. Acute rhabdomyolysis get rid of statin and gentle IV fluid hydration decreased to 20 an hour. 3. Chronic diastolic congestive heart failure.  Repeat echocardiogram showed normal ejection fraction.  No signs of heart failure currently.  Holding Lasix and give gentle IV fluid hydration while not eating very well 4. Did not do well with swallow evaluation with speech therapy.  Patient on dysphagia 3 diet with nectar thick liquid.  We will get palliative  care consultation tomorrow morning 5. Episodes of bradycardia on monitor hold the diltiazem. 6. BPH on Flomax 7. Weakness.  Physical therapy evaluation 8. Hyperlipidemia unspecified hold statin with rhabdomyolysis 9. Severe protein calorie malnutrition 10. Thrombocytopenia.  Continue to trend.  Code Status:     Code Status Orders  (From admission, onward)         Start     Ordered   10/07/19 2119  Full code  Continuous     10/07/19 2118        Code Status History  Date Active Date Inactive Code Status Order ID Comments User Context   06/28/2018 2242 07/03/2018 1450 Full Code [id number]  Vickie Epley, MD ED   03/14/2016 2223 03/15/2016 2224 Full Code [id number]  Gonczy, [place], MD ED   03/06/2016 1022 03/11/2016 1751 Full Code [id number]  Hubbard Robinson, MD ED   Advance Care Planning Activity     Family Communication: Tried to reach wife on the phone Disposition Plan: Need to see how he does with physical therapy.  Would like to see CPK come down further.  Time spent: 28 minutes  [place]

## 2019-10-09 NOTE — Progress Notes (Signed)
PT Cancellation Note  Patient Details Name: Keith Bryant. MRN: [id number] DOB: 04-13-1931   Cancelled Treatment:    Reason Eval/Treat Not Completed: Other (comment). Due to policy, need to hold 48 hours post initiation of anticoag meds secondary to acute PE. Will evaluate for PT needs on 1/27 in AM.   Shuntay Everetts 10/09/2019, 12:51 PM Greggory Stallion, PT, DPT [phone]

## 2019-10-09 NOTE — Progress Notes (Signed)
OT Cancellation Note  Patient Details Name: Keith A Lites Sr. MRN: [id number] DOB: 1931-01-16   Cancelled Treatment:    Reason Eval/Treat Not Completed: Medical issues which prohibited therapy. Pt noted this date to have bilat LE DVTs in addition to the PE previously noted. Pt continuing anti-coagulation. Due to policy, need to hold 48 hours post initiation of anticoag meds secondary to acute PE and DVTs. Will re-attempt OT evaluation next date as medically appropriate.   Jeni Salles, MPH, MS, OTR/L ascom [phone] 10/09/19, 1:01 PM

## 2019-10-09 NOTE — Progress Notes (Signed)
Patient has been attempting to get OOB for the past few hours; disoriented, stating he needed to go home. He removed his telemetry multiple times, and also removed his external catheter multiple times. He then removed his IV despite having mitts in place. Pt was given Haldol 2 mg IM. IV was replaced. Pt was seen by E. Ouma, the on call; APP, and he reported to her he was in pain. He was unable to quantitate his pain, however.   Pt was given Morphine 1 mg IVP for pain. He is currently resting quietly, appears to be dozing at the moment. Will CTM.

## 2019-10-10 ENCOUNTER — Inpatient Hospital Stay: Payer: Medicare Other

## 2019-10-10 DIAGNOSIS — R451 Restlessness and agitation: Secondary | ICD-10-CM

## 2019-10-10 DIAGNOSIS — Z515 Encounter for palliative care: Secondary | ICD-10-CM

## 2019-10-10 DIAGNOSIS — E43 Unspecified severe protein-calorie malnutrition: Secondary | ICD-10-CM

## 2019-10-10 DIAGNOSIS — T17908A Unspecified foreign body in respiratory tract, part unspecified causing other injury, initial encounter: Secondary | ICD-10-CM

## 2019-10-10 LAB — CBC
HCT: 44.4 % (ref 39.0–52.0)
Hemoglobin: 13.9 g/dL (ref 13.0–17.0)
MCH: 26.3 pg (ref 26.0–34.0)
MCHC: 31.3 g/dL (ref 30.0–36.0)
MCV: 83.9 fL (ref 80.0–100.0)
Platelets: 100 10*3/uL — ABNORMAL LOW (ref 150–400)
RBC: 5.29 MIL/uL (ref 4.22–5.81)
RDW: 19.2 % — ABNORMAL HIGH (ref 11.5–15.5)
WBC: 8.9 10*3/uL (ref 4.0–10.5)
nRBC: 0 % (ref 0.0–0.2)

## 2019-10-10 LAB — BASIC METABOLIC PANEL
Anion gap: 11 (ref 5–15)
BUN: 19 mg/dL (ref 8–23)
CO2: 24 mmol/L (ref 22–32)
Calcium: 8.9 mg/dL (ref 8.9–10.3)
Chloride: 109 mmol/L (ref 98–111)
Creatinine, Ser: 1.03 mg/dL (ref 0.61–1.24)
GFR calc Af Amer: >60 mL/min (ref >60)
GFR calc non Af Amer: >60 mL/min (ref >60)
Glucose, Bld: 114 mg/dL — ABNORMAL HIGH (ref 70–99)
Potassium: 3.5 mmol/L (ref 3.5–5.1)
Sodium: 144 mmol/L (ref 135–145)

## 2019-10-10 MED ORDER — LORAZEPAM 1 MG PO TABS: 1.0000 mg | ORAL_TABLET | ORAL | Status: DC | PRN

## 2019-10-10 MED ORDER — LORAZEPAM 2 MG/ML PO CONC: 1.0000 mg | ORAL | Status: DC | PRN

## 2019-10-10 MED ORDER — MORPHINE SULFATE (PF) 2 MG/ML IV SOLN
1.0000 mg | Freq: Once | INTRAVENOUS | Status: AC
  Administered 2019-10-10: 1 mg via INTRAVENOUS
  Filled 2019-10-10: qty 1

## 2019-10-10 MED ORDER — MORPHINE SULFATE (PF) 2 MG/ML IV SOLN: 1.0000 mg | INTRAVENOUS | Status: DC | PRN

## 2019-10-10 MED ORDER — BISACODYL 5 MG PO TBEC: 5.0000 mg | DELAYED_RELEASE_TABLET | Freq: Every day | ORAL | Status: DC | PRN

## 2019-10-10 MED ORDER — HALOPERIDOL LACTATE 5 MG/ML IJ SOLN: 2.0000 mg | INTRAMUSCULAR | Status: DC | PRN

## 2019-10-10 MED ORDER — LORAZEPAM 2 MG/ML IJ SOLN: 1.0000 mg | INTRAMUSCULAR | Status: DC | PRN

## 2019-10-10 MED ORDER — BIOTENE DRY MOUTH MT LIQD: 15.0000 mL | OROMUCOSAL | Status: DC | PRN

## 2019-10-10 MED ORDER — PSYLLIUM 95 % PO PACK
1.0000 | PACK | Freq: Every day | ORAL | Status: DC | PRN
  Filled 2019-10-10: qty 1

## 2019-10-10 NOTE — Progress Notes (Signed)
Pt slept for about 2 hours after receiving IM Haldol 2 mg and IV Morphine 1 mg. He then became impulsive with attempts to remove mitts, telemetry, and get OOB. Pt given partial bath and linen change. He also removed external catheter and is incontinent of urine. Skin throughout entire body has extreme flakiness, with dark patches of scaly skin. Lotion applied to entire body. Haldol and Morphine were repeated; will CTM.

## 2019-10-10 NOTE — Progress Notes (Signed)
New referral for [facility] home received from Palliative NP Kathie Rhodes, [place] aware. Patient information sent to referral. Writer to contact family to provide information. Possible discharge to the hospice home tomorrow pending bed availability and medical director approval. Thank you for the opportunity to be involved in the care of this patient and his family. Flo Shanks BSN, RN, [place] [phone]

## 2019-10-10 NOTE — TOC Initial Note (Signed)
Transition of Care (TOC) - Initial/Assessment Note    Patient Details  Name: Keith A Kestenbaum Sr. MRN: [id number] Date of Birth: 06/28/1931  Transition of Care [facility]) CM/SW Contact:    Victorino Dike, RN Phone Number: 10/10/2019, 2:41 PM  Clinical Narrative:                  Notified by Nurse Practitioner from  [facility] that she has spoken with family.  They are interested in [place] for patient.  NP called Craige Cotta with [facility].  TOC will continue to follow for needs.  Expected Discharge Plan: Home/Self Care Barriers to Discharge: Continued Medical Work up   Patient Goals and CMS Choice        Expected Discharge Plan and Services Expected Discharge Plan: Home/Self Care In-house Referral: Hospice / [place], Clinical Social Work Discharge Planning Services: CM Consult   Living arrangements for the past 2 months: [place]                                      Prior Living Arrangements/Services Living arrangements for the past 2 months: Single Family Home Lives with:: Spouse Patient language and need for interpreter reviewed:: Yes        Need for Family Participation in Patient Care: Yes (Comment) Care giver support system in place?: Yes (comment)   Criminal Activity/Legal Involvement Pertinent to Current Situation/Hospitalization: No - Comment as needed  Activities of Daily Living Home Assistive Devices/Equipment: Cane (specify quad or straight) ADL Screening (condition at time of admission) Patient's cognitive ability adequate to safely complete daily activities?: Yes Is the patient deaf or have difficulty hearing?: No Does the patient have difficulty seeing, even when wearing glasses/contacts?: Yes Does the patient have difficulty concentrating, remembering, or making decisions?: Yes Patient able to express need for assistance with ADLs?: No Does the patient have difficulty dressing or bathing?: Yes Independently performs  ADLs?: No Communication: Appropriate for developmental age Dressing (OT): Needs assistance Is this a change from baseline?: Change from baseline, expected to last >3 days Grooming: Needs assistance Is this a change from baseline?: Change from baseline, expected to last >3 days Feeding: Needs assistance Is this a change from baseline?: Change from baseline, expected to last >3 days Bathing: Needs assistance Is this a change from baseline?: Change from baseline, expected to last >3 days Toileting: Needs assistance Is this a change from baseline?: Change from baseline, expected to last >3days In/Out Bed: Needs assistance Is this a change from baseline?: Change from baseline, expected to last >3 days Walks in Home: Independent with device (comment)(use of cane per wife) Does the patient have difficulty walking or climbing stairs?: Yes Weakness of Legs: Both Weakness of Arms/Hands: Both  Permission Sought/Granted                  Emotional Assessment Appearance:: Appears older than stated age         Psych Involvement: No (comment)  Admission diagnosis:  Rhabdomyolysis [M62.82] Weakness [R53.1] Non-traumatic rhabdomyolysis [M62.82] Patient Active Problem List   Diagnosis Date Noted  . Acute deep vein thrombosis (DVT) of proximal vein of both lower extremities (HCC)   . Protein-calorie malnutrition, severe 10/08/2019  . Leg DVT (deep venous thromboembolism), acute, bilateral ([place])   . Chronic diastolic CHF (congestive heart failure) ([place])   . Bradycardia   . Rhabdomyolysis 10/07/2019  . CKD (chronic  kidney disease), stage III 10/07/2019  . Urinary incontinence 10/07/2019  . Generalized weakness 10/07/2019  . Weakness 10/07/2019  . Dehydration 10/07/2019  . Elevated troponin 10/07/2019  . Dysphagia 10/07/2019  . Thrombocytopenia ([place]) 10/07/2019  . Pulmonary embolism ([place]) 10/07/2019  . Aspiration pneumonia ([place]) 06/29/2018  . Intestinal adhesions with complete  obstruction ([place]) 06/28/2018  . Acalculous cholecystitis   . Abdominal pain 03/14/2016  . Hypokalemia   . Blood glucose elevated 03/06/2016  . HLD (hyperlipidemia) 03/06/2016  . BP (high blood pressure) 03/06/2016  . Small bowel obstruction ([place]) 03/06/2016  . AKI (acute kidney injury) ([place])   . Uncontrollable vomiting   . Edema leg 05/02/2015  . Chronic gouty arthritis 11/08/2014   PCP:  Leonel Ramsay, MD Pharmacy:   [facility], [address] [place] [address] Phone: [phone] Fax: [place], [place] [address] [place] [address] Phone: [phone] Fax: [phone]     Social Determinants of Health ([place]) Interventions    Readmission Risk Interventions Readmission Risk Prevention Plan 07/03/2018  Transportation Screening Complete  PCP or Specialist Appt within 5-7 Days Complete  Home Care Screening Complete  Medication Review (RN CM) Complete  Some recent data might be hidden

## 2019-10-10 NOTE — Evaluation (Addendum)
Objective Swallowing Evaluation: Type of Study: MBS-Modified Barium Swallow Study   Patient Details  Name: Keith A Rodas Sr. MRN: [id number] Date of Birth: 04-15-1931  Today's Date: 10/10/2019 Time: SLP Start Time (ACUTE ONLY): 1030 -SLP Stop Time (ACUTE ONLY): 1125  SLP Time Calculation (min) (ACUTE ONLY): 55 min   Past Medical History:  Past Medical History:  Diagnosis Date  . Diverticulitis   . Hypercholesteremia   . Hypertension   . Lower extremity edema   . Skin cancer    Past Surgical History:  Past Surgical History:  Procedure Laterality Date  . CATARACT EXTRACTION W/PHACO Right 09/18/2015   Procedure: CATARACT EXTRACTION PHACO AND INTRAOCULAR LENS PLACEMENT (IOC);  Surgeon: Leandrew Koyanagi, MD;  Location: [facility] ORS;  Service: Ophthalmology;  Laterality: Right;  US00:57.4 AP12.5 CDE7.15 FLUID LOT [id number]  . CATARACT EXTRACTION W/PHACO Left 10/16/2015   Procedure: CATARACT EXTRACTION PHACO AND INTRAOCULAR LENS PLACEMENT (IOC);  Surgeon: Leandrew Koyanagi, MD;  Location: [facility] ORS;  Service: Ophthalmology;  Laterality: Left;  [place]   1:02.9 AP%  12.5 CDE    8.81 casette lot # [id number]   exp 02/10/2017  . COLON RESECTION    . COLONOSCOPY    . HERNIA REPAIR    . TONSILLECTOMY     HPI: Pt is a 86 y.o. male with medical history significant of diverticulitis, hyperlipidemia, hypertension, aspiration pneumonia, urinary incontinence, SBO, diastolic CHF.  Suspect Cognitive decline baseline.  Presented with generalized weakness apparently yesterday EMS was called out to his house because he could not ambulate.  They got him up and set him in his arm chair.  They checked on him again in 24 hours he remained in the same position and has urinated on himself but was unable to get up to go to the bathroom.  Suspect Cognitive decline at Baseline -- Noted MRI results of "Advanced generalized parenchymal atrophy; chronic ischemic disease".  Pt described overt coughing when eating/drinking at  home prior to admission -- could not give a timeframe.    Subjective: Pt was pleasant and cooperative, but w/ significant Cognitive decline noted -- verbose and often talking w/ food in mouth. Required Mod+ cues for follow through w/ tasks. Suspect Cognitive decline baseline.     Assessment / Plan / Recommendation  CHL IP CLINICAL IMPRESSIONS 10/10/2019  Clinical Impression Pt presents w/ Severe Pharyngeal phase dysphagia, Mild Oral phase dysphagia which presents Significant Risk for Aspiration thus Pulmonary decline. FRANK Aspiration occurred consistently during this study w/ po trials of thin and Nectar consistency liquids, and bolus residue of puree trials. Pt exhibited declined Cognitive Awareness to the Aspiration occurring during swallowing; SILENT Aspiration occurred consistently w/ minimal attention by the patient to attempt to clear and protect his airway from the Aspiration. During the Pharyngeal phase, delayed pharyngeal swallow initiation occurred w/ trials of thin and Nectar consistency liquids resulting in bolus material spilling into the laryngeal vestibule BEFORE the swallow -- deep laryngeal Penetration w/ Aspiration following. Despite multiple swallows, Penetrated bolus material remained in the vestibule and was not cleared. Pt exhibited decreased pharyngeal pressure and laryngeal excursion during the swallow resulting in Moderate-Severe pharyngeal residue post initial swallow -- bolus residue off all consistencies remained in the Valleculae and Pyriform Sinuses and along the Pharyngeal Wall. This significant amount of pharyngeal residue spilled into the larygneal vestibule as Penetration then Aspiration BETWEEN/POST trials. Pt was instructed to use f/u, Dry swallows as well as a [employer] w/out much success in clearing the pharynx of residue. Pt  was instructed/Cued to Cough and Throat Clear strongly in attempts to clear Penetrated/Aspirated material. Pt was often INSENSATE to the laryngeal  Penetration AND Aspiration. During the Oral phase, min decreased oral phase control for bolus management during A-P transfer; generalized weakness and decreased attention during task. Slight-min premature spillage noted moreso w/ liquids prior to pharyngeal swallow initiation. Slight+ oral residue remained post initial swallow; pt followed w/ lingual sweeping and a dry swallow to clear majority of bolus residue. No anterior leakage/loss noted.  Recommend strict NPO status including oral Medications; MD/NSG/NP w/ Palliative Care consulted post study re: results.   SLP Visit Diagnosis Dysphagia, oropharyngeal phase (R13.12)  Attention and concentration deficit following --  Frontal lobe and executive function deficit following --  Impact on safety and function Severe aspiration risk;Risk for inadequate nutrition/hydration      CHL IP TREATMENT RECOMMENDATION 10/10/2019  Treatment Recommendations Patient unable to participate in swallow therapy at this time     Prognosis 10/10/2019  Prognosis for Safe Diet Advancement Guarded  Barriers to Reach Goals Cognitive deficits;Time post onset;Severity of deficits  Barriers/Prognosis Comment suspect Baseline Cognitive decline per MRI     CHL IP DIET RECOMMENDATION 10/10/2019  SLP Diet Recommendations NPO  Liquid Administration via --  Medication Administration Via alternative means  Compensations --  Postural Changes --      CHL IP OTHER RECOMMENDATIONS 10/10/2019  Recommended Consults (No Data)  Oral Care Recommendations Oral care QID;Staff/trained caregiver to provide oral care  Other Recommendations --      CHL IP FOLLOW UP RECOMMENDATIONS 10/10/2019  Follow up Recommendations Skilled Nursing facility      [facility] IP FREQUENCY AND DURATION 10/10/2019  Speech Therapy Frequency (ACUTE ONLY) (No Data)  Treatment Duration (No Data)           CHL IP ORAL PHASE 10/10/2019  Oral Phase Impaired  Oral - Pudding Teaspoon --  Oral - Pudding Cup --   Oral - Honey Teaspoon --  Oral - Honey Cup --  Oral - Nectar Teaspoon --  Oral - Nectar Cup --  Oral - Nectar Straw --  Oral - Thin Teaspoon --  Oral - Thin Cup --  Oral - Thin Straw --  Oral - Puree --  Oral - Mech Soft --  Oral - Regular --  Oral - Multi-Consistency --  Oral - Pill --  Oral Phase - Comment min decreased oral phase control for bolus management during A-P transfer; generalized weakness and decreased attention during task. Slight-min premature spillage noted moreso w/ liquids prior to pharyngeal swallow initiation. Slight+ oral residue remained post initial swallow; pt followed w/ lingual sweeping and a dry swallow to clear majority of bolus residue. No anterior leakage/loss noted.     CHL IP PHARYNGEAL PHASE 10/10/2019  Pharyngeal Phase Impaired  Pharyngeal- Pudding Teaspoon --  Pharyngeal --  Pharyngeal- Pudding Cup --  Pharyngeal --  Pharyngeal- Honey Teaspoon --  Pharyngeal --  Pharyngeal- Honey Cup --  Pharyngeal --  Pharyngeal- Nectar Teaspoon --  Pharyngeal --  Pharyngeal- Nectar Cup --  Pharyngeal --  Pharyngeal- Nectar Straw --  Pharyngeal --  Pharyngeal- Thin Teaspoon --  Pharyngeal --  Pharyngeal- Thin Cup --  Pharyngeal --  Pharyngeal- Thin Straw --  Pharyngeal --  Pharyngeal- Puree --  Pharyngeal --  Pharyngeal- Mechanical Soft --  Pharyngeal --  Pharyngeal- Regular --  Pharyngeal --  Pharyngeal- Multi-consistency --  Pharyngeal --  Pharyngeal- Pill --  Pharyngeal --  Pharyngeal Comment delayed  pharyngeal swallow initiation occurred w/ trials of thin and Nectar consistency liquids resulting in bolus material spilling into the laryngeal vestibule BEFORE the swallow -- deep laryngeal Penetration w/ Aspiration following. Despite multiple swallows, Penetrated bolus material remained in the vestibule and was not cleared. Pt exhibited decreased pharyngeal pressure and laryngeal excursion during the swallow resulting in Moderate-Severe pharyngeal  residue post initial swallow -- bolus residue off all consistencies remained in the Valleculae and Pyriform Sinuses and along the Pharyngeal Wall. This significant amount of pharyngeal residue spilled into the larygneal vestibule as Penetration then Aspiration BETWEEN/POST trials. Pt was instructed to use f/u, Dry swallows as well as a [employer] w/out much success in clearing the pharynx of residue. Pt was instructed/Cued to Cough and Throat Clear strongly in attempts to clear Penetrated/Aspirated material. Pt was often INSENSATE to the laryngeal Penetration AND Aspiration.      CHL IP CERVICAL ESOPHAGEAL PHASE 10/10/2019  Cervical Esophageal Phase WFL  Pudding Teaspoon --  Pudding Cup --  Honey Teaspoon --  Honey Cup --  Nectar Teaspoon --  Nectar Cup --  Nectar Straw --  Thin Teaspoon --  Thin Cup --  Thin Straw --  Puree --  Mechanical Soft --  Regular --  Multi-consistency --  Pill --  Cervical Esophageal Comment --        Orinda Kenner, MS, CCC-SLP [facility] 10/10/2019, 2:55 PM

## 2019-10-10 NOTE — Consult Note (Addendum)
Consultation Note Date: 10/10/2019   Patient Name: Keith A Dibella Sr.  DOB: 08/15/1931  MRN: [id number]  Age / Sex: 86 y.o., male  PCP: Leonel Ramsay, MD Referring Physician: Thornell Mule, MD  Reason for Consultation: Establishing goals of care  HPI/Patient Profile: 86 y.o. male  with past medical history of diverticulitis, HLD, HTN, and diastolic CHF admitted on 7/41/6384 with generalized weakness.  He is being treated for acute pulmonary embolism with bilateral lower extremity DVT. Also with acute rhabdomyolysis receiving IV fluids. He is refusing most PO intake and SLP eval determined he is high risk for aspiration. MBS with overt aspiration - made NPO. Patient with agitation overnight - required haldol administration. PMT consulted d/t concerns about difficulty swallowing and poor PO intake.   Clinical Assessment and Goals of Care: I have reviewed medical records including EPIC notes, labs and imaging, received report from RN and SLP, assessed the patient and then met with patient's wife, Keith Bryant,  to discuss diagnosis prognosis, GOC, EOL wishes, disposition and options.  I introduced Palliative Medicine as specialized medical care for people living with serious illness. It focuses on providing relief from the symptoms and stress of a serious illness. The goal is to improve quality of life for both the patient and the family.  We discussed a brief life review of the patient. Patient and Keith Bryant have been married >60 years. They have a son who lives in [place].  As far as functional and nutritional status, Keith Bryant tells me of a steady decline over the past several months. She describes decline in function - acute decline in past two weeks with patient mostly non-ambulatory. She also tells me of severely decreased appetite and coughing when he eats/drinks. Lastly we discussed decline in cognitive function - Polly shares she has been concerned  patient was having strokes or developing dementia d/t his confusion and difficulty speaking.    We discussed his current illness and what it means in the larger context of his on-going co-morbidities.  Natural disease trajectory and expectations at EOL were discussed. Specifically, we discussed patient's overall debility and failure to thrive. We discussed his MBS and concern about further PO intake. We discussed aggressive treatment vs focusing on comfort. Keith Bryant shares the patient would not be interested in aggressive measures such as feeding tube placement and she would like to honor his wishes.  I attempted to elicit values and goals of care important to the patient.  After much discussion, it was determined to transition care to focus on patient's comfort and avoid any further aggressive care - including continued IV therapies as being hooked up to IV and mittens required have been agitating patient. We also discussed that continuing these measures would not fix his overall status.    Advance directives, concepts specific to code status, artifical feeding and hydration, and rehospitalization were considered and discussed. Keith Bryant shares the patient would not want full code interventions and also shares that she understands these interventions would be futile in current setting with poor prognosis.   Hospice services outpatient were explained and offered. We discussed hospice care at home vs hospice facility. As much as Keith Bryant would like to take the patient home, she fears she is unable to meet his needs at home given his debility and agitation. She is more interested in hospice facility placement.   Questions and concerns were addressed. The family was encouraged to call with questions or concerns.   I offered to call son; however, Keith Bryant plans to  share conversation described above with him.   Primary Decision Maker NEXT OF KIN - wife Keith Bryant  Patient unable to participate in decision making  process currently d/t confusion    SUMMARY OF RECOMMENDATIONS   - transition to full comfort care - offer sips and bites of food/fluids to promote comfort  - PRN medications added to promote comfort - will d/c oral medications given MBS results - d/c IV fluids as patient would like to be free from protective mittens and wife would like to focus on full comfort  Code Status/Advance Care Planning:  DNR   Symptom Management:   PRN morphine, ativan, haldol  Mouth care scheduled  Palliative Prophylaxis:   Aspiration, Delirium Protocol, Frequent Pain Assessment, Oral Care and Turn Reposition  Additional Recommendations (Limitations, Scope, Preferences):  Full Comfort Care, Initiate Comfort Feeding and No Artificial Feeding  Psycho-social/Spiritual:   Desire for further Chaplaincy support:no  Additional Recommendations: Education on Hospice  Prognosis:   < 2 weeks is likely given patient's poor PO intake and high risk for aspiration, family ready to focus on comfort only. Also with PE/DVT no longer on anticoagulants  Discharge Planning: Hospice facility      Primary Diagnoses: Present on Admission: . HLD (hyperlipidemia) . Rhabdomyolysis . CKD (chronic kidney disease), stage III . Urinary incontinence . Dehydration . Elevated troponin . Thrombocytopenia ([place]) . Pulmonary embolism ([place])   I have reviewed the medical record, interviewed the patient and family, and examined the patient. The following aspects are pertinent.  Past Medical History:  Diagnosis Date  . Diverticulitis   . Hypercholesteremia   . Hypertension   . Lower extremity edema   . Skin cancer    Social History   Socioeconomic History  . Marital status: Married    Spouse name: Not on file  . Number of children: Not on file  . Years of education: Not on file  . Highest education level: Not on file  Occupational History  . Not on file  Tobacco Use  . Smoking status: Former Smoker     Quit date: 03/29/1972    Years since quitting: 47.5  . Smokeless tobacco: Never Used  Substance and Sexual Activity  . 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 Drug use: No  . Sexual activity: Not on file  Other Topics Concern  . Not on file  Social History Narrative  . Not on file   Social Determinants of Health   Financial Resource Strain:   . Difficulty of Paying Living Expenses: Not on file  Food Insecurity:   . Worried About Charity fundraiser in the Last Year: Not on file  . Ran Out of Food in the Last Year: Not on file  Transportation Needs:   . Lack of Transportation (Medical): Not on file  . Lack of Transportation (Non-Medical): Not on file  Physical Activity:   . Days of Exercise per Week: Not on file  . Minutes of Exercise per Session: Not on file  Stress:   . Feeling of Stress : Not on file  Social Connections:   . Frequency of Communication with Friends and Family: Not on file  . Frequency of Social Gatherings with Friends and Family: Not on file  . Attends Religious Services: Not on file  . Active Member of Clubs or Organizations: Not on file  . Attends Archivist Meetings: Not on file  . Marital Status: Not on file  Family History  Problem Relation Age of Onset  . Heart disease Father   . Cancer Father   . Cancer Sister    Scheduled Meds: . ammonium lactate  1 application Topical BID  . apixaban  10 mg Oral BID   Followed by  . [START ON 10/15/2019] apixaban  5 mg Oral BID  . feeding supplement (NEPRO CARB STEADY)  237 mL Oral TID BM  . multivitamin with minerals  1 tablet Oral Daily  . pantoprazole  40 mg Oral Daily  . tamsulosin  0.4 mg Oral Daily   Continuous Infusions: . sodium chloride     PRN Meds:.acetaminophen **OR** acetaminophen, bisacodyl, haloperidol lactate, HYDROcodone-acetaminophen, ondansetron **OR** ondansetron (ZOFRAN) IV No Known Allergies Review of Systems  Unable to perform ROS: Mental  status change  Patient confused, does deny pain or any other complaints.   Physical Exam Constitutional:      General: He is not in acute distress. HENT:     Head: Normocephalic and atraumatic.  Cardiovascular:     Rate and Rhythm: Normal rate and regular rhythm.  Pulmonary:     Effort: Pulmonary effort is normal.  Abdominal:     Palpations: Abdomen is soft.  Musculoskeletal:     Right lower leg: Swelling present.     Left lower leg: Swelling present.  Neurological:     Mental Status: He is alert. He is confused.  Psychiatric:        Cognition and Memory: Cognition is impaired. Memory is impaired.        Judgment: Judgment is impulsive.     Vital Signs: BP (!) 133/55 (BP Location: Left Arm)   Pulse 61   Temp 98.1 F (36.7 C) (Oral)   Resp 16   Ht 6' (1.829 m)   Wt 90.9 kg   SpO2 97%   BMI 27.18 kg/m  Pain Scale: 0-10   Pain Score: 0-No pain   SpO2: SpO2: 97 % O2 Device:SpO2: 97 % O2 Flow Rate: .   IO: Intake/output summary:   Intake/Output Summary (Last 24 hours) at 10/10/2019 1100 Last data filed at 10/10/2019 0320 Gross per 24 hour  Intake 607.11 ml  Output 100 ml  Net 507.11 ml    LBM: Last BM Date: (Unknown) Baseline Weight: Weight: 103 kg Most recent weight: Weight: 90.9 kg     Palliative Assessment/Data: PPS 20% d/t very poor PO intake      Time: 1320-1500 Time Total: 100 minutes Greater than 50%  of this time was spent counseling and coordinating care related to the above assessment and plan.  Juel Burrow, DNP, AGNP-C Palliative Medicine Team [phone] Pager: [phone]

## 2019-10-10 NOTE — Progress Notes (Addendum)
Patient's wife at bedside. Updated on diet status and that Palliative nurse needs to speak with her.Patient is NPO now.  She will stay until around 3:30pm, will let palliative team know.   Update 1800: Patient now comfort care. IV still intact, tele monitor taken off. Patient currently asleep. No complaints. Has refused mobility other than pillow support. DNR armband put on.

## 2019-10-10 NOTE — Progress Notes (Signed)
OT Cancellation Note  Patient Details Name: Keith A Brull Sr. MRN: [id number] DOB: 10-02-1930   Cancelled Treatment:    Reason Eval/Treat Not Completed: Other (comment). OT continues to follow this pt remotely. Upon attempt to see pt for evaluation this am. Pt noted to have been given Haldol at 0401 this date. Per secure chat with primary RN, pt sleeping during shift change. Not appropriate for OT evaluation this AM. Will hold until later this date and initiate OT services, as available, and pt medically appropriate to participate in OT evaluation.   Shara Blazing, M.S., OTR/L Ascom: [phone] 10/10/19, 8:59 AM   Keith Bryant 10/10/2019, 8:57 AM

## 2019-10-10 NOTE — Progress Notes (Addendum)
Patient ID: Keith A Flaming Sr., male   DOB: [birth year], 86 y.o.   MRN: [id number] [facility] PROGRESS NOTE  Keith Bryant [id number] DOB: [birth year] DOA: 10/07/2019 PCP: Leonel Ramsay, MD  HPI/Subjective: Patient appeared very tired this morning was getting ready to undergo swallow study under the fluoroscopy.    Objective: Vitals:   10/10/19 0539 10/10/19 0735  BP: 105/63 (!) 133/55  Pulse: 67 61  Resp: 20 16  Temp: 98.1 F (36.7 C) 98.1 F (36.7 C)  SpO2:  97%    Intake/Output Summary (Last 24 hours) at 10/10/2019 1403 Last data filed at 10/10/2019 0320 Gross per 24 hour  Intake 80 ml  Output 100 ml  Net -20 ml   Filed Weights   10/07/19 1643 10/07/19 2111 10/09/19 0415  Weight: 103 kg 90.2 kg 90.9 kg    ROS: Review of Systems  Unable to perform ROS: Acuity of condition  Constitutional: Positive for malaise/fatigue.  Respiratory: Negative for shortness of breath.   Cardiovascular: Negative for chest pain.  Gastrointestinal: Negative for abdominal pain.  Musculoskeletal: Negative for joint pain.   Exam: Physical Exam  HENT:  Nose: No mucosal edema.  Mouth/Throat: No oropharyngeal exudate or posterior oropharyngeal edema.  Eyes: Conjunctivae and lids are normal.  Neck: Carotid bruit is not present.  Cardiovascular: S1 normal and S2 normal. Exam reveals no gallop.  Murmur heard.  Systolic murmur is present with a grade of 3/6. Respiratory: No respiratory distress. He has decreased breath sounds in the right lower field and the left lower field. He has no wheezes. He has no rhonchi. He has no rales.  GI: Soft. Bowel sounds are normal. There is no abdominal tenderness.  Musculoskeletal:     Right ankle: Swelling present.     Left ankle: Swelling present.  Lymphadenopathy:    He has no cervical adenopathy.  Neurological: He is alert.  Able to straight leg raise.  Follow simple commands  Skin: Skin is warm. Nails show no clubbing.  Chronic lower  extremity discoloration  Psychiatric: He has a normal mood and affect.      Data Reviewed: Basic Metabolic Panel: Recent Labs  Lab 10/07/19 1646 10/08/19 0427 10/09/19 0529 10/10/19 0426  NA 142 143 143 144  K 3.9 3.9 3.6 3.5  CL 103 106 108 109  CO2 28 30 25 24   GLUCOSE 138* 149* 110* 114*  BUN 26* 22 20 19   CREATININE 1.36* 1.22 0.97 1.03  CALCIUM 9.4 8.7* 8.5* 8.9  MG  --  2.0  --   --   PHOS  --  4.1  --   --    Liver Function Tests: Recent Labs  Lab 10/07/19 1646 10/08/19 0427  AST 70* 64*  ALT 22 24  ALKPHOS 36* 31*  BILITOT 1.5* 1.6*  PROT 7.7 6.4*  ALBUMIN 3.8 3.2*   CBC: Recent Labs  Lab 10/07/19 1646 10/08/19 0427 10/09/19 0529 10/10/19 0426  WBC 9.5 9.7  --  8.9  NEUTROABS 7.1  --   --   --   HGB 14.9 13.3 12.6* 13.9  HCT 48.3 42.3  --  44.4  MCV 83.7 83.4  --  83.9  PLT 93* 84*  --  100*   Cardiac Enzymes: Recent Labs  Lab 10/07/19 1646 10/08/19 0427 10/09/19 0529  CKTOTAL 2,536* 1,881* 665*   BNP (last 3 results) Recent Labs    06/20/19 1559 10/07/19 1646  BNP 1,440.0* 1,312.0*      Recent  Results (from the past 240 hour(s))  Respiratory Panel by RT PCR (Flu A&B, Covid) - Nasopharyngeal Swab     Status: None   Collection Time: 10/07/19  5:44 PM   Specimen: Nasopharyngeal Swab  Result Value Ref Range Status   SARS Coronavirus 2 by RT PCR NEGATIVE NEGATIVE Final    Comment: (NOTE) SARS-CoV-2 target nucleic acids are NOT DETECTED. The SARS-CoV-2 RNA is generally detectable in upper respiratoy specimens during the acute phase of infection. The lowest concentration of SARS-CoV-2 viral copies this assay can detect is 131 copies/mL. A negative result does not preclude SARS-Cov-2 infection and should not be used as the sole basis for treatment or other patient management decisions. A negative result may occur with  improper specimen collection/handling, submission of specimen other than nasopharyngeal swab, presence of viral  mutation(s) within the areas targeted by this assay, and inadequate number of viral copies (<131 copies/mL). A negative result must be combined with clinical observations, patient history, and epidemiological information. The expected result is Negative. Fact Sheet for Patients:  PinkCheek.be Fact Sheet for Healthcare Providers:  GravelBags.it This test is not yet ap proved or cleared by the [place] FDA and  has been authorized for detection and/or diagnosis of SARS-CoV-2 by FDA under an Emergency Use Authorization (EUA). This EUA will remain  in effect (meaning this test can be used) for the duration of the COVID-19 declaration under Section 564(b)(1) of the Act, 21 U.S.C. section 360bbb-3(b)(1), unless the authorization is terminated or revoked sooner.    Influenza A by PCR NEGATIVE NEGATIVE Final   Influenza B by PCR NEGATIVE NEGATIVE Final    Comment: (NOTE) The Xpert Xpress SARS-CoV-2/FLU/RSV assay is intended as an aid in  the diagnosis of influenza from Nasopharyngeal swab specimens and  should not be used as a sole basis for treatment. Nasal washings and  aspirates are unacceptable for Xpert Xpress SARS-CoV-2/FLU/RSV  testing. Fact Sheet for Patients: PinkCheek.be Fact Sheet for Healthcare Providers: GravelBags.it This test is not yet approved or cleared by the [place] FDA and  has been authorized for detection and/or diagnosis of SARS-CoV-2 by  FDA under an Emergency Use Authorization (EUA). This EUA will remain  in effect (meaning this test can be used) for the duration of the  Covid-19 declaration under Section 564(b)(1) of the Act, 21  U.S.C. section 360bbb-3(b)(1), unless the authorization is  terminated or revoked. Performed at [facility], [address] [place], [place] [address]      Studies: No results found.  Scheduled  Meds: . ammonium lactate  1 application Topical BID  . apixaban  10 mg Oral BID   Followed by  . [START ON 10/15/2019] apixaban  5 mg Oral BID  . feeding supplement (NEPRO CARB STEADY)  237 mL Oral TID BM  . multivitamin with minerals  1 tablet Oral Daily  . pantoprazole  40 mg Oral Daily  . tamsulosin  0.4 mg Oral Daily   Continuous Infusions: . sodium chloride      Assessment/Plan:  1. Acute pulmonary embolism with bilateral lower extremity DVT: Patient's breathing is much better as he is now maintaining saturation on room air -  continue Eliquis treatment as directed  2. Acute rhabdomyolysis: Latest CPK was 665 on 10/09/2019, which is a significant improvement from initial lab - S/P statin discontinuation and continue gentle IV fluid hydration decreased to 20 an hour.  He has history of heart failure  3. Chronic diastolic congestive heart failure: No  acute issues at this time - repeat echocardiogram showed normal ejection fraction.  No signs of heart failure currently.  -Continue to holding Lasix and give gentle IV fluid hydration while not eating very well  4. Poor oral intake: Did not do well with swallow evaluation with speech therapy.  Patient on dysphagia 3 diet with nectar thick liquid.  -Swallow study under the fluoroscopy was done today and patient did not do well.  And therefore speech therapy recommended n.p.o. now.  -  palliative care consulted; follow recommendation  5. Episodes of bradycardia: No new event recorded since the last 1 a day ago - on monitor  - hold the diltiazem.  6. BPH on Flomax 7. Weakness.  Physical therapy evaluation 8. Hyperlipidemia unspecified hold statin with rhabdomyolysis 9. Severe protein calorie malnutrition  10. Thrombocytopenia.  Stable at this point.  Continue to trend.  11.  Comfort care: Patient failed failed with barium swallow study -Patient's wife and comfort care nurse practitioner had a discussion.  Given patient's significant  medical illness as well as significantly decreased oral intake, patient wife expressed wishes for full comfort care with possible hospice.  Palliative care nurse practitioner notified me. -Palliative care nurse is to implement comfort care orders and measures -She will also reach out for possible hospice -Appreciate palliative care -Patient is now full comfort care  Code Status:     Code Status Orders  (From admission, onward)         Start     Ordered   10/07/19 2119  Full code  Continuous     10/07/19 2118        Code Status History    Date Active Date Inactive Code Status Order ID Comments User Context   06/28/2018 2242 07/03/2018 1450 Full Code [id number]  Vickie Epley, MD ED   03/14/2016 2223 03/15/2016 2224 Full Code [id number]  Gonczy, [place], MD ED   03/06/2016 1022 03/11/2016 1751 Full Code [id number]  Hubbard Robinson, MD ED   Advance Care Planning Activity     Family Communication: Tried to reach wife on the phone Disposition Plan: Need to see how he does with physical therapy.  Would like to see CPK come down further.  Time spent: 28 minutes  [place]

## 2019-10-10 NOTE — Care Management Important Message (Signed)
Important Message  Patient Details  Name: Keith A Jallow Sr. MRN: [id number] Date of Birth: 07/01/1931   Medicare Important Message Given:  Yes     Dannette Barbara 10/10/2019, 10:50 AM

## 2019-10-11 MED ORDER — HALOPERIDOL LACTATE 5 MG/ML IJ SOLN: 2.0000 mg | INTRAMUSCULAR | Status: AC | PRN

## 2019-10-11 MED ORDER — LORAZEPAM 2 MG/ML IJ SOLN: 1.0000 mg | INTRAMUSCULAR | 0 refills | Status: AC | PRN

## 2019-10-11 MED ORDER — BIOTENE DRY MOUTH MT LIQD: 15.0000 mL | OROMUCOSAL | Status: AC | PRN

## 2019-10-11 MED ORDER — AMMONIUM LACTATE 12 % EX LOTN: 1.0000 "application " | TOPICAL_LOTION | Freq: Two times a day (BID) | CUTANEOUS | 0 refills | Status: AC

## 2019-10-11 MED ORDER — ONDANSETRON HCL 4 MG/2ML IJ SOLN: 4.0000 mg | Freq: Four times a day (QID) | INTRAMUSCULAR | 0 refills | Status: AC | PRN

## 2019-10-11 MED ORDER — MORPHINE SULFATE (PF) 2 MG/ML IV SOLN: 1.0000 mg | INTRAVENOUS | 0 refills | Status: AC | PRN

## 2019-10-11 MED ORDER — BISACODYL 5 MG PO TBEC: 5.0000 mg | DELAYED_RELEASE_TABLET | Freq: Every day | ORAL | 0 refills | Status: AC | PRN

## 2019-10-11 MED ORDER — LORAZEPAM 2 MG/ML PO CONC: 1.0000 mg | ORAL | 0 refills | Status: AC | PRN

## 2019-10-11 NOTE — Discharge Summary (Signed)
Physician Discharge Summary  Patient ID: Keith A Youngman Sr. MRN: [id number] DOB/AGE: 1931/03/27 86 y.o.  Admit date: 10/07/2019 Discharge date: 10/11/2019  Admission Diagnoses:  Discharge Diagnoses:  Active Problems:   HLD (hyperlipidemia)   Rhabdomyolysis   CKD (chronic kidney disease), stage III   Urinary incontinence   Generalized weakness   Weakness   Dehydration   Elevated troponin   Dysphagia   Thrombocytopenia (HCC)   Pulmonary embolism (HCC)   Protein-calorie malnutrition, severe   Leg DVT (deep venous thromboembolism), acute, bilateral (HCC)   Chronic diastolic CHF (congestive heart failure) (HCC)   Bradycardia   Acute deep vein thrombosis (DVT) of proximal vein of both lower extremities (HCC)   Aspiration into airway   Palliative care by specialist   Comfort measures only status   Agitation   Discharged Condition: critical  Hospital Course:  HPI (Brief) on Admission:  Keith A Rempel Sr. is a 86 y.o. male with medical history significant of diverticulitis, hyperlipidemia, hypertension, aspiration pneumonia, urinary incontinence, SBO, diastolic CHF Presented with   generalized weakness and  could not ambulate.  They got him up and set him in his arm chair.  The EMS checked on him again in 24 hours he remained in the same position and has urinated on himself but was unable to get up to go to the bathroom. At that point he was brought to the ER. Gradual onset o generalized weakness up up to last week was still able to walk But for the past 3 days could not even sit up straight. Per wife he has been very weak in general for the past 3 days he has severe generalized fatigue he has been falling back down in to a chair every time he tried to get up a few times. Pt has hx of urinary incontinence to wear 2 depends for the past year and often leaks. He has chronic leg edema due to valvular CHF on Lasix.   Appropriate imaging studies revealed patient has acute pulmonary embolism  and bilateral lower extremity DVT.  Comfort Care: Poor oral intake and Severe protein calorie malnutrition -  Did not do well with swallow evaluation with speech therapy.  Patient was initially on dysphagia 3 diet with nectar thick liquid.  -Swallow study under the fluoroscopy was done on 10/10/2019 and patient failed.   -placed on n.p.o. per speech therapy -Patient has been losing weight significantly -  palliative care consulted  -Palliative care and patient wife had a discussion. - Given patient's significant medical illness as well as significantly decreased in oral intake, patient wife expressed wishes for full comfort care with possible hospice.  -Implement comfort care orders and measures from 10/10/2019 -Home hospice set up per CM through [facility] home  -Patient is to be discharged on 10/11/2019     Acute pulmonary embolism with bilateral lower extremity DVT:  Stop treatment  Acute rhabdomyolysis:   Chronic diastolic congestive heart failure:   Episodes of bradycardia:   BPH   Generalized weakness  Hyperlipidemia unspecified  Thrombocytopenia   Consults: Palliative Care, Cardiology   Significant Diagnostic Studies:  Radiology, Blood Work   Treatments: As hospital course and discharge med list  Discharge Exam: Blood pressure (!) 171/61, pulse 60, temperature (!) 97.5 F (36.4 C), resp. rate 16, height 6' (1.829 m), weight 90.9 kg, SpO2 99 %.  General: Lying flat taking deep breath.  Appears comfortable. Nose: No mucosal edema.  Mouth/Throat: No oropharyngeal exudate or posterior oropharyngeal edema.  Eyes:  Conjunctivae and lids are normal.  Neck: Carotid bruit is not present.  Cardiovascular: S1 normal and S2 normal. Exam reveals no gallop.  Systolic murmur is present with a grade of 3/6. Respiratory: No respiratory distress. He has decreased breath sounds in the right lower field and the left lower field. He has no wheezes. He has no rhonchi. He has no  rales.  GI: Soft. Bowel sounds are normal. There is no abdominal tenderness.  Musculoskeletal:     Right ankle: Swelling present.     Left ankle: Swelling present.  Lymphadenopathy:    He has no cervical adenopathy.  Neurological: He is alert.    Generalized weakness Skin: Skin is warm. Nails show no clubbing.  Chronic lower extremity discoloration  Psychiatric: Appears tired and lethargic   Disposition: Discharge disposition: 51-Hospice/Medical Facility     Patient is to receive home hospice upon discharge from hospital.  In the meantime continue comfort measures.  Discharge Instructions    Increase activity slowly   Complete by: As directed      Allergies as of 10/11/2019   No Known Allergies     Medication List    STOP taking these medications   atorvastatin 20 MG tablet Commonly known as: LIPITOR   CENTRUM SILVER PO   cholecalciferol 25 MCG (1000 UNIT) tablet Commonly known as: VITAMIN D3   diltiazem 240 MG 24 hr capsule Commonly known as: CARDIZEM CD   ferrous sulfate 325 (65 FE) MG tablet   furosemide 40 MG tablet Commonly known as: LASIX   Magnesium 400 MG Tabs   omeprazole 20 MG capsule Commonly known as: PRILOSEC   oxybutynin 5 MG 24 hr tablet Commonly known as: DITROPAN-XL   potassium chloride SA 20 MEQ tablet Commonly known as: KLOR-CON   tamsulosin 0.4 MG Caps capsule Commonly known as: FLOMAX   Vitamin B-12 500 MCG Lozg     TAKE these medications   ammonium lactate 12 % lotion Commonly known as: LAC-HYDRIN Apply 1 application topically 2 (two) times daily. What changed: Another medication with the same name was added. Make sure you understand how and when to take each.   ammonium lactate 12 % lotion Commonly known as: LAC-HYDRIN Apply 1 application topically 2 (two) times daily. What changed: You were already taking a medication with the same name, and this prescription was added. Make sure you understand how and when to take  each.   antiseptic oral rinse Liqd Apply 15 mLs topically as needed for dry mouth.   bisacodyl 5 MG EC tablet Commonly known as: DULCOLAX Take 1 tablet (5 mg total) by mouth daily as needed for moderate constipation.   haloperidol lactate 5 MG/ML injection Commonly known as: HALDOL Inject 0.4 mLs (2 mg total) into the muscle every 4 (four) hours as needed.   LORazepam 2 MG/ML concentrated solution Commonly known as: ATIVAN Place 0.5 mLs (1 mg total) under the tongue every 4 (four) hours as needed for anxiety.   LORazepam 2 MG/ML injection Commonly known as: ATIVAN Inject 0.5 mLs (1 mg total) into the vein every 4 (four) hours as needed for anxiety.   morphine 2 MG/ML injection Inject 0.5 mLs (1 mg total) into the vein every 2 (two) hours as needed (or dyspnea).   ondansetron 4 MG/2ML Soln injection Commonly known as: ZOFRAN Inject 2 mLs (4 mg total) into the vein every 6 (six) hours as needed for nausea.        Signed: Thornell Mule 10/11/2019, 11:18 AM

## 2019-10-11 NOTE — Progress Notes (Signed)
Daily Progress Note   Patient Name: Keith A Scarlett Sr.       Date: 10/11/2019 DOB: 11-17-1930  Age: 86 y.o. MRN#: [id number] Attending Physician: Thornell Mule, MD Primary Care Physician: Leonel Ramsay, MD Admit Date: 10/07/2019  Reason for Consultation/Follow-up: Establishing goals of care  Subjective: Lying in bed, pleasantly confused, denies complaints, asking for something to drink  Length of Stay: 4  Current Medications: Scheduled Meds:  . ammonium lactate  1 application Topical BID    Continuous Infusions:   PRN Meds: antiseptic oral rinse, bisacodyl, haloperidol lactate, [DISCONTINUED] LORazepam **OR** LORazepam **OR** LORazepam, morphine injection, [DISCONTINUED] ondansetron **OR** ondansetron (ZOFRAN) IV  Physical Exam Constitutional:      General: He is not in acute distress. HENT:     Head: Normocephalic and atraumatic.  Cardiovascular:     Rate and Rhythm: Normal rate.  Pulmonary:     Effort: Pulmonary effort is normal.     Breath sounds: Normal breath sounds.  Musculoskeletal:     Right ankle: Swelling present.     Left ankle: Swelling present.  Skin:    General: Skin is warm and dry.  Neurological:     Mental Status: He is alert. He is disoriented.             Vital Signs: BP (!) 171/61 (BP Location: Left Arm)   Pulse 60   Temp (!) 97.5 F (36.4 C)   Resp 16   Ht 6' (1.829 m)   Wt 90.9 kg   SpO2 99%   BMI 27.18 kg/m  SpO2: SpO2: 99 % O2 Device: O2 Device: Room Air O2 Flow Rate:    Intake/output summary:   Intake/Output Summary (Last 24 hours) at 10/11/2019 1312 Last data filed at 10/11/2019 1100 Gross per 24 hour  Intake 0 ml  Output 1100 ml  Net -1100 ml   LBM: Last BM Date: (pt confused) Baseline Weight: Weight: 103 kg Most recent weight:  Weight: 90.9 kg       Palliative Assessment/Data: PPS 20%    Flowsheet Rows     Most Recent Value  Intake Tab  Referral Department  Hospitalist  Unit at Time of Referral  Cardiac/Telemetry Unit  Palliative Care Primary Diagnosis  Neurology  Date Notified  10/09/19  Palliative Care Type  New Palliative care  Reason for referral  Clarify Goals of Care  Date of Admission  10/07/19  Date first seen by Palliative Care  10/10/19  # of days Palliative referral response time  1 Day(s)  # of days IP prior to Palliative referral  2  Clinical Assessment  Palliative Performance Scale Score  20%  Psychosocial & Spiritual Assessment  Palliative Care Outcomes  Patient/Family meeting held?  Yes  Who was at the meeting?  wife  Palliative Care Outcomes  Improved pain interventions, Improved non-pain symptom therapy, Clarified goals of care, Counseled regarding hospice, Provided end of life care assistance, Provided advance care planning, Changed to focus on comfort, Provided psychosocial or spiritual support, Changed CPR status, Completed durable DNR, Transitioned to hospice  Patient/Family wishes: Interventions discontinued/not started   Tube feedings/TPN      Patient Active Problem List   Diagnosis Date Noted  .  Aspiration into airway   . Palliative care by specialist   . Comfort measures only status   . Agitation   . Acute deep vein thrombosis (DVT) of proximal vein of both lower extremities (HCC)   . Protein-calorie malnutrition, severe 10/08/2019  . Leg DVT (deep venous thromboembolism), acute, bilateral ([place])   . Chronic diastolic CHF (congestive heart failure) ([place])   . Bradycardia   . Rhabdomyolysis 10/07/2019  . CKD (chronic kidney disease), stage III 10/07/2019  . Urinary incontinence 10/07/2019  . Generalized weakness 10/07/2019  . Weakness 10/07/2019  . Dehydration 10/07/2019  . Elevated troponin 10/07/2019  . Dysphagia 10/07/2019  . Thrombocytopenia ([place]) 10/07/2019  .  Pulmonary embolism ([place]) 10/07/2019  . Aspiration pneumonia ([place]) 06/29/2018  . 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 Acalculous cholecystitis   . Abdominal pain 03/14/2016  . Hypokalemia   . Blood glucose elevated 03/06/2016  . HLD (hyperlipidemia) 03/06/2016  . BP (high blood pressure) 03/06/2016  . Small bowel obstruction ([place]) 03/06/2016  . AKI (acute kidney injury) ([place])   . Uncontrollable vomiting   . Edema leg 05/02/2015  . Chronic gouty arthritis 11/08/2014    Palliative Care Assessment & Plan   HPI: 86 y.o. male  with past medical history of diverticulitis, HLD, HTN, and diastolic CHF admitted on [id number] with generalized weakness.  He is being treated for acute pulmonary embolism with bilateral lower extremity DVT. Also with acute rhabdomyolysis receiving IV fluids. He is refusing most PO intake and SLP eval determined he is high risk for aspiration. MBS with overt aspiration - made NPO. Patient with agitation overnight - required haldol administration. PMT consulted d/t concerns about difficulty swallowing and poor PO intake.   Assessment: Checked in on Mr. Mihelic - he is comfortable but remains confused. Asking for something to drink - he was okay with just ice chips. Plans remain appropriate for hospice facility.  Followed up with patient's wife - offered emotional support. She continues to agree to focus on full comfort and move to hospice facility. She has spoken with their son and he also agrees with plans.   Recommendations/Plan:  Continue comfort care  Continue ice chips  PRN medications to promote comfort  Move to hospice facility  Goals of Care and Additional Recommendations:  Limitations on Scope of Treatment: Full Comfort Care  Code Status:  DNR  Prognosis:   < 2 weeks is likely given patient's poor PO intake and high risk for aspiration, family ready to focus on comfort only. Also with PE/DVT no longer on  anticoagulants  Discharge Planning:  Hospice facility  Care plan was discussed with RN, patient's wife, hospice liaison  Thank you for allowing the Palliative Medicine Team to assist in the care of this patient.   Total Time 20 minutes Prolonged Time Billed  no       Greater than 50%  of this time was spent counseling and coordinating care related to the above assessment and plan.  Juel Burrow, DNP, [facility] Palliative Medicine Team Team Phone # [phone]  Pager [phone]

## 2019-10-11 NOTE — Progress Notes (Signed)
[facility] Liaison note: Writer spoke via telephone to Mrs. Val this morning to provided education regarding hospice services, philosophy, team approach to care with understanding voiced. Much emotional support provided.  Writer was able to meet in person with Mrs. Cherian this afternoon. She remains agreeable for patient to transfer to the Hospice home this afternoon. Report called to the hospice home. EMS notified for transport. Signed out of facility DNR in place in discharge packet. Hospital care team all updated. Thank you for the opportunity to be involved in the care of this patient and his family. Flo Shanks BSN, RN, [place] [phone]

## 2019-10-11 NOTE — Progress Notes (Addendum)
Keith A Luz Sr. to be D/C'd to hospice home per MD order.  Wife at bedside and aware.  Allergies as of 10/11/2019   No Known Allergies     Medication List    STOP taking these medications   atorvastatin 20 MG tablet Commonly known as: LIPITOR   CENTRUM SILVER PO   cholecalciferol 25 MCG (1000 UNIT) tablet Commonly known as: VITAMIN D3   diltiazem 240 MG 24 hr capsule Commonly known as: CARDIZEM CD   ferrous sulfate 325 (65 FE) MG tablet   furosemide 40 MG tablet Commonly known as: LASIX   Magnesium 400 MG Tabs   omeprazole 20 MG capsule Commonly known as: PRILOSEC   oxybutynin 5 MG 24 hr tablet Commonly known as: DITROPAN-XL   potassium chloride SA 20 MEQ tablet Commonly known as: KLOR-CON   tamsulosin 0.4 MG Caps capsule Commonly known as: FLOMAX   Vitamin B-12 500 MCG Lozg     TAKE these medications   ammonium lactate 12 % lotion Commonly known as: LAC-HYDRIN Apply 1 application topically 2 (two) times daily. What changed: Another medication with the same name was added. Make sure you understand how and when to take each.   ammonium lactate 12 % lotion Commonly known as: LAC-HYDRIN Apply 1 application topically 2 (two) times daily. What changed: You were already taking a medication with the same name, and this prescription was added. Make sure you understand how and when to take each.   antiseptic oral rinse Liqd Apply 15 mLs topically as needed for dry mouth.   bisacodyl 5 MG EC tablet Commonly known as: DULCOLAX Take 1 tablet (5 mg total) by mouth daily as needed for moderate constipation.   haloperidol lactate 5 MG/ML injection Commonly known as: HALDOL Inject 0.4 mLs (2 mg total) into the muscle every 4 (four) hours as needed.   LORazepam 2 MG/ML concentrated solution Commonly known as: ATIVAN Place 0.5 mLs (1 mg total) under the tongue every 4 (four) hours as needed for anxiety.   LORazepam 2 MG/ML injection Commonly known as:  ATIVAN Inject 0.5 mLs (1 mg total) into the vein every 4 (four) hours as needed for anxiety.   morphine 2 MG/ML injection Inject 0.5 mLs (1 mg total) into the vein every 2 (two) hours as needed (or dyspnea).   ondansetron 4 MG/2ML Soln injection Commonly known as: ZOFRAN Inject 2 mLs (4 mg total) into the vein every 6 (six) hours as needed for nausea.       Vitals:   10/11/19 0801 10/11/19 1446  BP: (!) 171/61 (!) 167/61  Pulse: 60 73  Resp: 16 18  Temp: (!) 97.5 F (36.4 C) 98.3 F (36.8 C)  SpO2: 99% 97%  per [facility] okay to leave IV in. Pt denies pain at this time. No complaints noted.  Patient escorted via stretcher, and D/C to hospice home via [facility].  Keith Bryant

## 2019-10-11 NOTE — TOC Transition Note (Signed)
Transition of Care (TOC) - CM/SW Discharge Note   Patient Details  Name: Keith A Rogala Sr. MRN: [id number] Date of Birth: [birth year]  Transition of [facility]) CM/SW Contact:  Victorino Dike, RN Phone Number: 10/11/2019, 12:47 PM   Clinical Narrative:     Patient discharging home to [facility] today via [facility].  Santiago Glad, [facility] Nurse has prepared and planned discharge..  Final next level of care: [place] Barriers to Discharge: Barriers Resolved   Patient Goals and CMS Choice        Discharge Placement                       Discharge Plan and Services In-house Referral: Hospice / [facility], Clinical Social Work Discharge Planning Services: CM Consult                        [place] Agency: [facility])        Social Determinants of Health (SDOH) Interventions     Readmission Risk Interventions Readmission Risk Prevention Plan 07/03/2018  Transportation Screening Complete  PCP or Specialist Appt within 5-7 Days Complete  Home Care Screening Complete  Medication Review (RN CM) Complete  Some recent data might be hidden

## 2019-10-15 DEATH — deceased

## 2019-10-17 ENCOUNTER — Telehealth: Payer: Self-pay | Admitting: Urology

## 2020-03-29 IMAGING — CT CT HEAD W/O CM
3 of 4 series · 14 of 47 positions shown, 16 images · non-contrast
Comparison: 07/20/2011

CLINICAL DATA: RIGHT eye swelling and redness, found on couch in
the same spot as yesterday, soaked with urine, encephalopathy,
hypertension

EXAM:
CT HEAD WITHOUT CONTRAST
TECHNIQUE: Contiguous axial images were obtained from the base of the skull
through the vertex without intravenous contrast. Sagittal and
coronal MPR images reconstructed from axial data set.

[Series 3: ax head wo · axial · 0.35mm/px · z∈[-53,+72]mm · 8 of 33 slices shown, 10 images]
[im 3/33  brain]
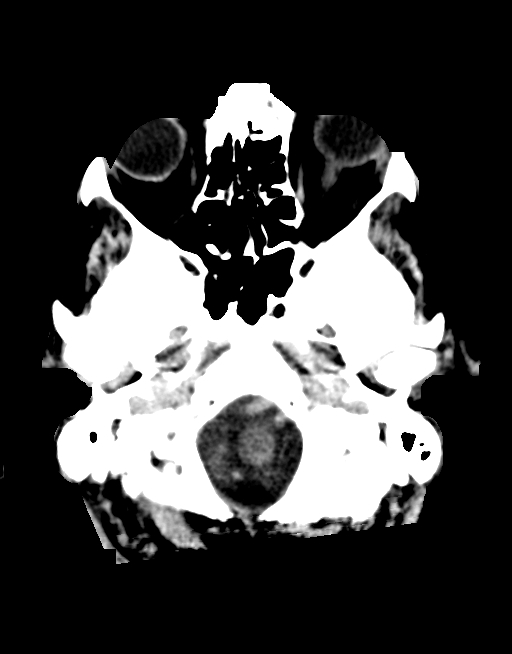
[im 3/33  bone]
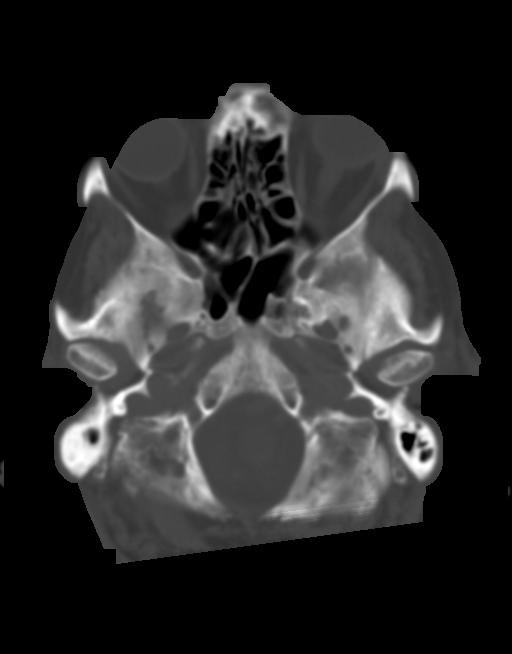
[im 7/33  brain]
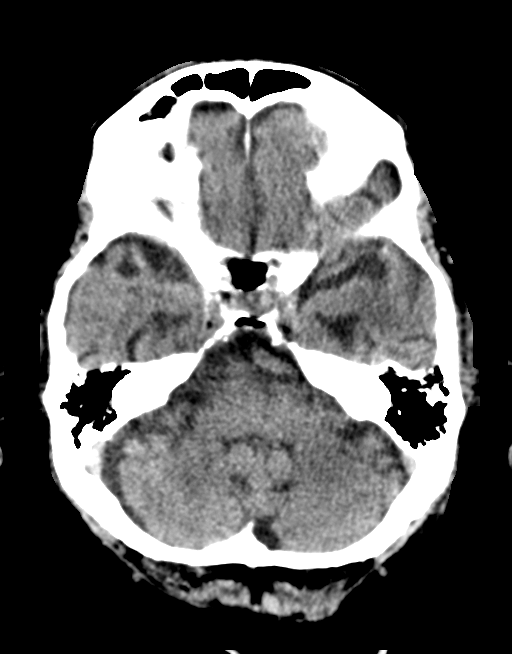
[im 12/33  brain]
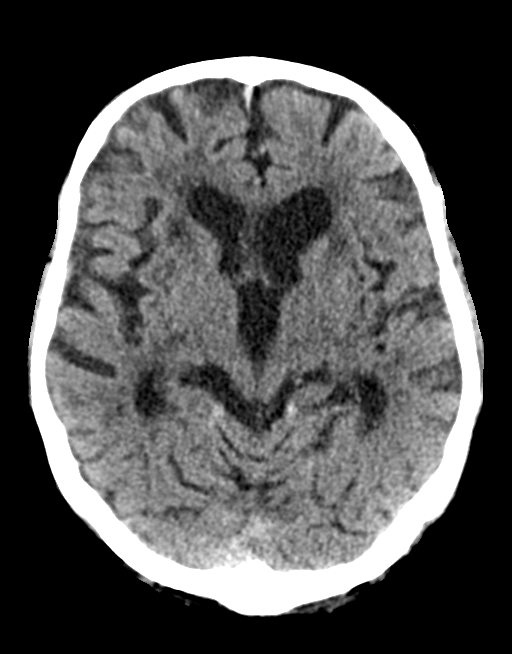
[im 14/33  brain]
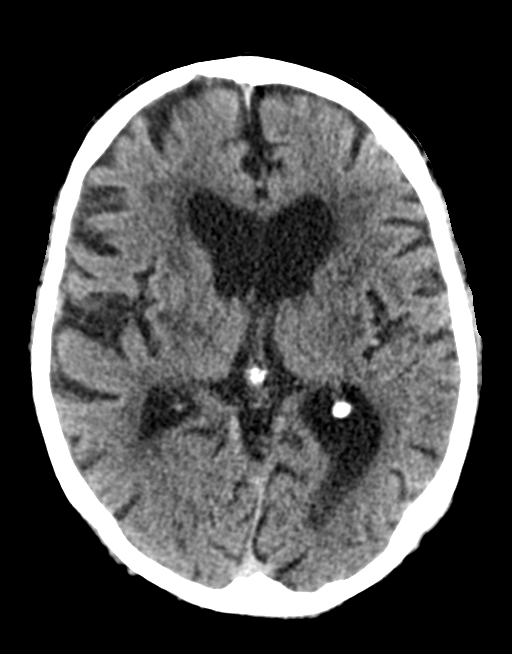
[im 19/33  brain]
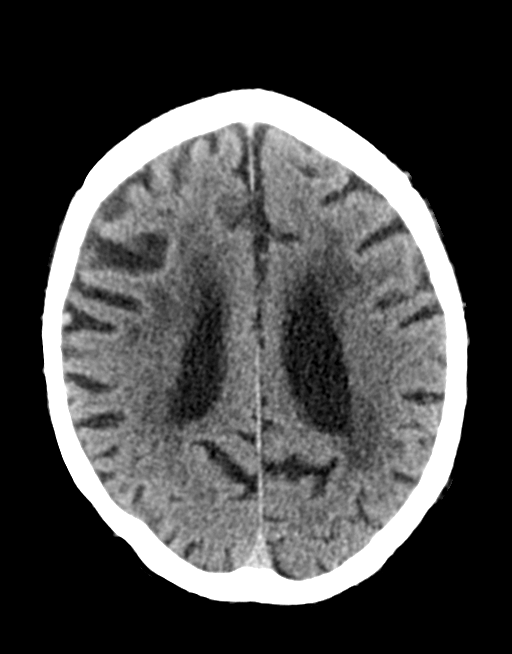
[im 19/33  bone]
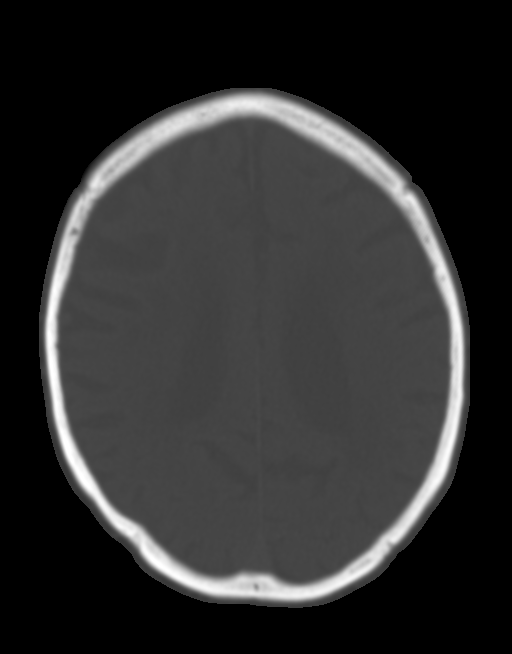
[im 21/33  brain]
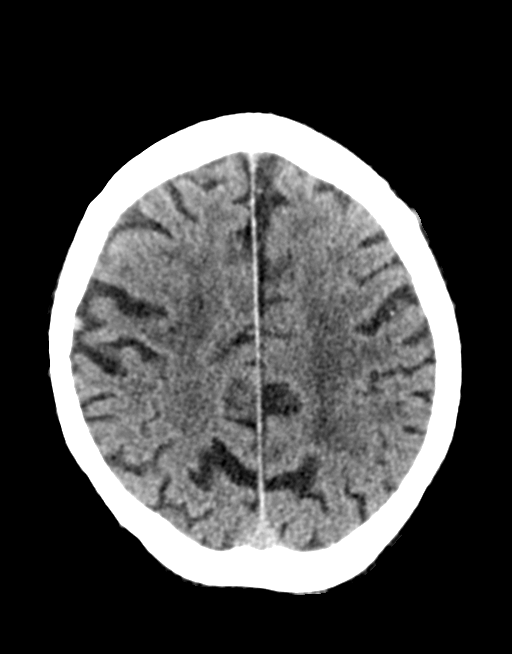
[im 26/33  brain]
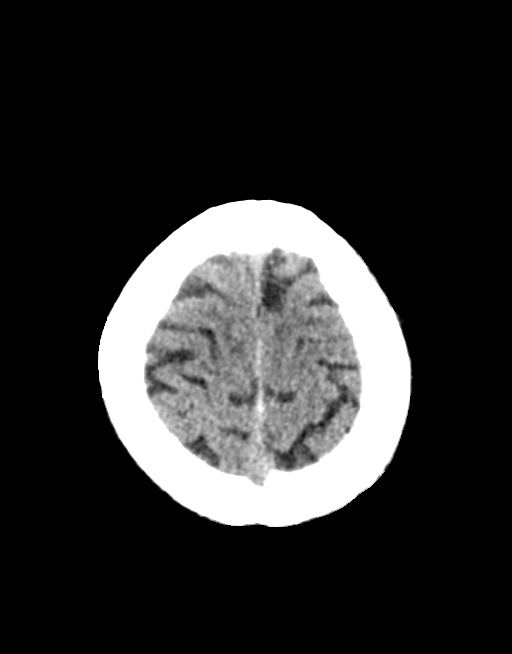
[im 30/33  brain]
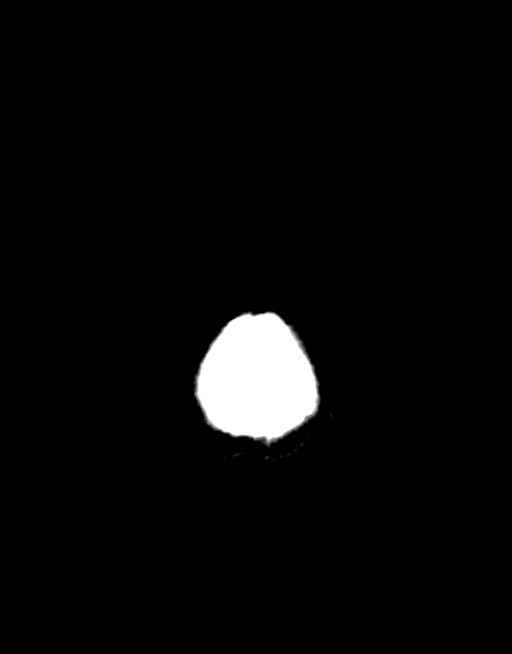

[Series 4: coronal soft tissue · coronal · 0.32mm/px · 3 of 74 slices shown]
[im 26/74  brain]
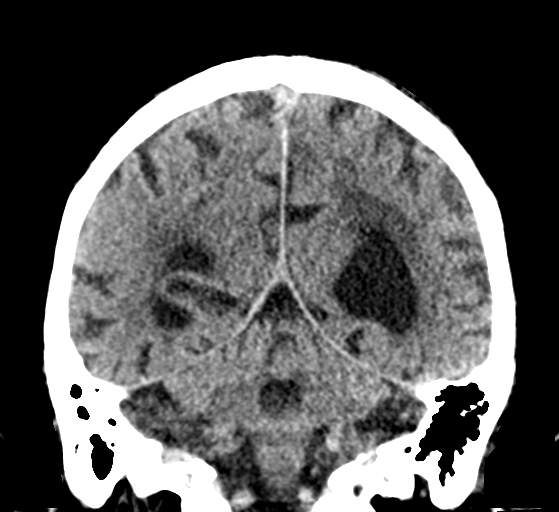
[im 33/74  brain]
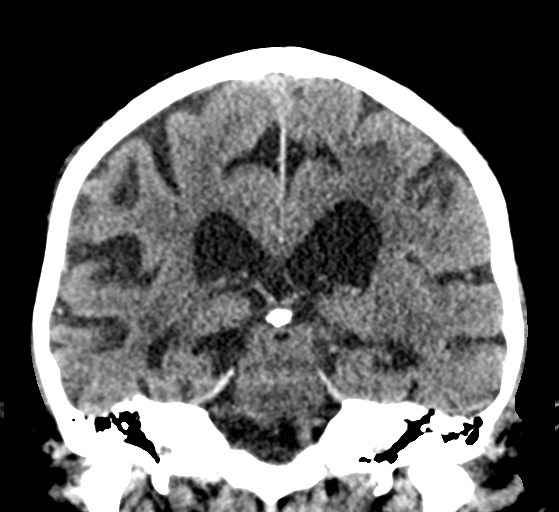
[im 41/74  brain]
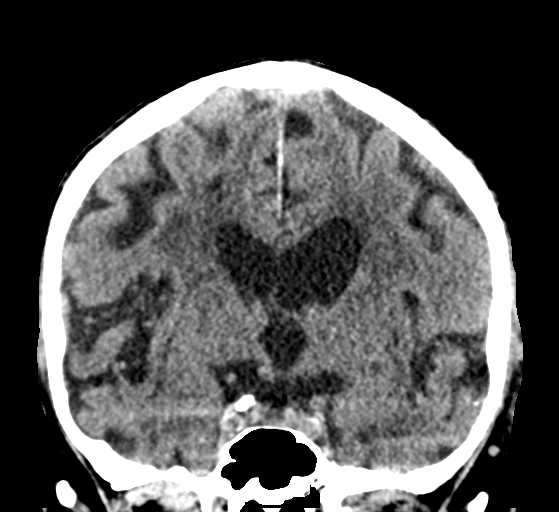

[Series 5: sagittal soft tissue · sagittal · 0.32mm/px · 3 of 56 slices shown]
[im 19/56  brain]
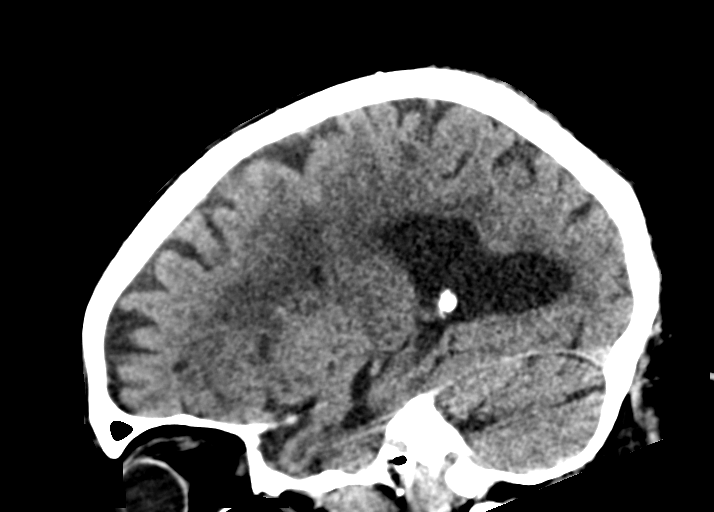
[im 28/56  brain]
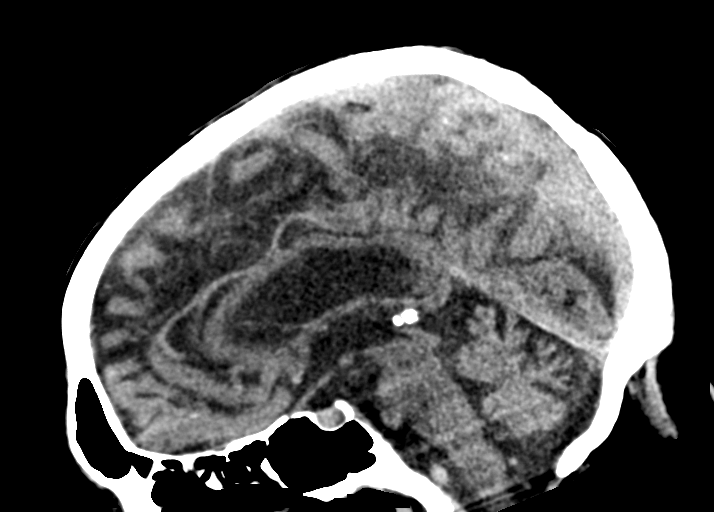
[im 37/56  brain]
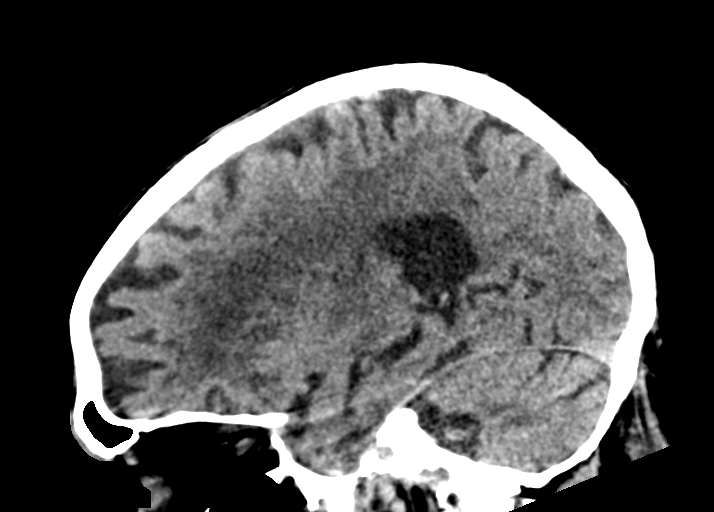

[14 of 47 positions shown; findings below may reference images not displayed]

FINDINGS: Brain: Generalized atrophy. Normal ventricular morphology. No
midline shift or mass effect. Small vessel chronic ischemic changes
of deep cerebral white matter. No intracranial hemorrhage, mass
lesion or evidence of acute infarction. No extra-axial fluid
collections.

Vascular: No hyperdense vessels

Skull: Intact

Sinuses/Orbits: Clear

Other: N/A
IMPRESSION: Atrophy with small vessel chronic ischemic changes of deep cerebral
white matter.

No acute intracranial abnormalities.
